# Patient Record
Sex: Female | Born: 1945 | Race: White | Hispanic: No | Smoking: Former smoker
Health system: Southern US, Community
[De-identification: ages and names within clinical notes are randomized; demographics above are authoritative.]

## PROBLEM LIST (undated history)

## (undated) DIAGNOSIS — F419 Anxiety disorder, unspecified: Secondary | ICD-10-CM

## (undated) DIAGNOSIS — I639 Cerebral infarction, unspecified: Secondary | ICD-10-CM

## (undated) DIAGNOSIS — E785 Hyperlipidemia, unspecified: Secondary | ICD-10-CM

## (undated) DIAGNOSIS — H7190 Unspecified cholesteatoma, unspecified ear: Secondary | ICD-10-CM

## (undated) DIAGNOSIS — J189 Pneumonia, unspecified organism: Secondary | ICD-10-CM

## (undated) DIAGNOSIS — Z9889 Other specified postprocedural states: Secondary | ICD-10-CM

## (undated) DIAGNOSIS — Z9049 Acquired absence of other specified parts of digestive tract: Secondary | ICD-10-CM

## (undated) DIAGNOSIS — E559 Vitamin D deficiency, unspecified: Secondary | ICD-10-CM

## (undated) DIAGNOSIS — C801 Malignant (primary) neoplasm, unspecified: Secondary | ICD-10-CM

## (undated) DIAGNOSIS — T7840XA Allergy, unspecified, initial encounter: Secondary | ICD-10-CM

## (undated) DIAGNOSIS — Z842 Family history of other diseases of the genitourinary system: Secondary | ICD-10-CM

## (undated) DIAGNOSIS — D0512 Intraductal carcinoma in situ of left breast: Secondary | ICD-10-CM

## (undated) DIAGNOSIS — IMO0002 Reserved for concepts with insufficient information to code with codable children: Secondary | ICD-10-CM

## (undated) DIAGNOSIS — J449 Chronic obstructive pulmonary disease, unspecified: Secondary | ICD-10-CM

## (undated) DIAGNOSIS — M8580 Other specified disorders of bone density and structure, unspecified site: Secondary | ICD-10-CM

## (undated) DIAGNOSIS — T4145XA Adverse effect of unspecified anesthetic, initial encounter: Secondary | ICD-10-CM

## (undated) DIAGNOSIS — I10 Essential (primary) hypertension: Secondary | ICD-10-CM

## (undated) DIAGNOSIS — N289 Disorder of kidney and ureter, unspecified: Secondary | ICD-10-CM

## (undated) DIAGNOSIS — T8859XA Other complications of anesthesia, initial encounter: Secondary | ICD-10-CM

## (undated) HISTORY — DX: Reserved for concepts with insufficient information to code with codable children: IMO0002

## (undated) HISTORY — DX: Pneumonia, unspecified organism: J18.9

## (undated) HISTORY — DX: Vitamin D deficiency, unspecified: E55.9

## (undated) HISTORY — DX: Acquired absence of other specified parts of digestive tract: Z90.49

## (undated) HISTORY — DX: Allergy, unspecified, initial encounter: T78.40XA

## (undated) HISTORY — DX: Unspecified cholesteatoma, unspecified ear: H71.90

## (undated) HISTORY — DX: Essential (primary) hypertension: I10

## (undated) HISTORY — DX: Family history of other diseases of the genitourinary system: Z84.2

## (undated) HISTORY — DX: Other specified disorders of bone density and structure, unspecified site: M85.80

## (undated) HISTORY — PX: ABDOMINAL HYSTERECTOMY: SHX81

## (undated) HISTORY — DX: Hyperlipidemia, unspecified: E78.5

## (undated) HISTORY — DX: Disorder of kidney and ureter, unspecified: N28.9

## (undated) HISTORY — PX: EYE SURGERY: SHX253

## (undated) HISTORY — PX: APPENDECTOMY: SHX54

## (undated) HISTORY — PX: OTHER SURGICAL HISTORY: SHX169

---

## 2000-07-16 ENCOUNTER — Encounter: Payer: Self-pay | Admitting: Family Medicine

## 2000-07-16 ENCOUNTER — Ambulatory Visit (HOSPITAL_COMMUNITY): Admission: RE | Admit: 2000-07-16 | Discharge: 2000-07-16 | Payer: Self-pay | Admitting: Family Medicine

## 2004-02-06 ENCOUNTER — Ambulatory Visit: Payer: Self-pay | Admitting: Family Medicine

## 2004-02-29 ENCOUNTER — Ambulatory Visit: Payer: Self-pay | Admitting: Family Medicine

## 2004-06-12 ENCOUNTER — Ambulatory Visit: Payer: Self-pay | Admitting: Family Medicine

## 2004-08-20 ENCOUNTER — Ambulatory Visit: Payer: Self-pay | Admitting: Family Medicine

## 2004-10-09 ENCOUNTER — Ambulatory Visit: Payer: Self-pay | Admitting: Family Medicine

## 2005-01-19 ENCOUNTER — Ambulatory Visit: Payer: Self-pay | Admitting: Family Medicine

## 2005-02-18 ENCOUNTER — Ambulatory Visit: Payer: Self-pay | Admitting: Family Medicine

## 2005-05-26 ENCOUNTER — Ambulatory Visit: Payer: Self-pay | Admitting: Family Medicine

## 2005-06-18 ENCOUNTER — Ambulatory Visit: Payer: Self-pay | Admitting: Family Medicine

## 2005-10-21 ENCOUNTER — Ambulatory Visit: Payer: Self-pay | Admitting: Family Medicine

## 2005-10-26 ENCOUNTER — Ambulatory Visit: Payer: Self-pay | Admitting: Family Medicine

## 2006-03-02 ENCOUNTER — Ambulatory Visit: Payer: Self-pay | Admitting: Family Medicine

## 2006-03-15 ENCOUNTER — Ambulatory Visit: Payer: Self-pay | Admitting: Family Medicine

## 2006-06-14 ENCOUNTER — Ambulatory Visit: Payer: Self-pay | Admitting: Vascular Surgery

## 2006-07-20 ENCOUNTER — Ambulatory Visit: Payer: Self-pay | Admitting: Family Medicine

## 2008-07-18 ENCOUNTER — Ambulatory Visit (HOSPITAL_COMMUNITY): Admission: RE | Admit: 2008-07-18 | Discharge: 2008-07-18 | Payer: Self-pay | Admitting: Endocrinology

## 2009-01-09 ENCOUNTER — Encounter: Admission: RE | Admit: 2009-01-09 | Discharge: 2009-01-09 | Payer: Self-pay | Admitting: Otolaryngology

## 2009-02-06 ENCOUNTER — Ambulatory Visit (HOSPITAL_COMMUNITY): Admission: RE | Admit: 2009-02-06 | Discharge: 2009-02-07 | Payer: Self-pay | Admitting: Otolaryngology

## 2009-02-06 ENCOUNTER — Encounter (INDEPENDENT_AMBULATORY_CARE_PROVIDER_SITE_OTHER): Payer: Self-pay | Admitting: Otolaryngology

## 2009-12-27 ENCOUNTER — Ambulatory Visit (HOSPITAL_COMMUNITY)
Admission: RE | Admit: 2009-12-27 | Discharge: 2009-12-27 | Payer: Self-pay | Source: Home / Self Care | Admitting: Otolaryngology

## 2010-05-29 LAB — HM DEXA SCAN

## 2010-06-12 LAB — CBC
MCH: 30.1 pg (ref 26.0–34.0)
MCHC: 33.5 g/dL (ref 30.0–36.0)
MCV: 89.9 fL (ref 78.0–100.0)
Platelets: 317 10*3/uL (ref 150–400)
RDW: 13 % (ref 11.5–15.5)
WBC: 12.7 10*3/uL — ABNORMAL HIGH (ref 4.0–10.5)

## 2010-06-12 LAB — BASIC METABOLIC PANEL
BUN: 22 mg/dL (ref 6–23)
CO2: 27 mEq/L (ref 19–32)
Chloride: 103 mEq/L (ref 96–112)
Creatinine, Ser: 1.9 mg/dL — ABNORMAL HIGH (ref 0.4–1.2)
Glucose, Bld: 101 mg/dL — ABNORMAL HIGH (ref 70–99)

## 2010-06-12 LAB — SURGICAL PCR SCREEN: MRSA, PCR: NEGATIVE

## 2010-07-02 LAB — CBC
Platelets: 441 10*3/uL — ABNORMAL HIGH (ref 150–400)
RDW: 12.7 % (ref 11.5–15.5)
WBC: 17.1 10*3/uL — ABNORMAL HIGH (ref 4.0–10.5)

## 2010-07-02 LAB — BASIC METABOLIC PANEL
BUN: 19 mg/dL (ref 6–23)
Calcium: 10.4 mg/dL (ref 8.4–10.5)
Creatinine, Ser: 1.89 mg/dL — ABNORMAL HIGH (ref 0.4–1.2)
GFR calc non Af Amer: 27 mL/min — ABNORMAL LOW (ref >60)
Glucose, Bld: 111 mg/dL — ABNORMAL HIGH (ref 70–99)

## 2010-07-08 ENCOUNTER — Encounter: Payer: Self-pay | Admitting: Nurse Practitioner

## 2010-07-08 DIAGNOSIS — M8580 Other specified disorders of bone density and structure, unspecified site: Secondary | ICD-10-CM | POA: Insufficient documentation

## 2010-07-08 DIAGNOSIS — K219 Gastro-esophageal reflux disease without esophagitis: Secondary | ICD-10-CM | POA: Insufficient documentation

## 2010-07-08 DIAGNOSIS — N184 Chronic kidney disease, stage 4 (severe): Secondary | ICD-10-CM | POA: Insufficient documentation

## 2010-07-08 DIAGNOSIS — M109 Gout, unspecified: Secondary | ICD-10-CM | POA: Insufficient documentation

## 2010-07-08 DIAGNOSIS — I10 Essential (primary) hypertension: Secondary | ICD-10-CM | POA: Insufficient documentation

## 2010-07-08 DIAGNOSIS — E785 Hyperlipidemia, unspecified: Secondary | ICD-10-CM | POA: Insufficient documentation

## 2010-07-08 DIAGNOSIS — M889 Osteitis deformans of unspecified bone: Secondary | ICD-10-CM | POA: Insufficient documentation

## 2010-07-08 DIAGNOSIS — Z842 Family history of other diseases of the genitourinary system: Secondary | ICD-10-CM | POA: Insufficient documentation

## 2010-07-08 DIAGNOSIS — N289 Disorder of kidney and ureter, unspecified: Secondary | ICD-10-CM

## 2010-07-08 DIAGNOSIS — H7190 Unspecified cholesteatoma, unspecified ear: Secondary | ICD-10-CM | POA: Insufficient documentation

## 2010-07-08 DIAGNOSIS — IMO0002 Reserved for concepts with insufficient information to code with codable children: Secondary | ICD-10-CM

## 2010-07-08 DIAGNOSIS — Z9049 Acquired absence of other specified parts of digestive tract: Secondary | ICD-10-CM | POA: Insufficient documentation

## 2012-01-04 ENCOUNTER — Other Ambulatory Visit: Payer: Self-pay | Admitting: Gastroenterology

## 2012-06-22 ENCOUNTER — Encounter (HOSPITAL_COMMUNITY): Payer: Self-pay

## 2012-06-27 ENCOUNTER — Telehealth: Payer: Self-pay | Admitting: Family Medicine

## 2012-06-27 NOTE — Telephone Encounter (Signed)
Per pt can/t tolerarted zetia causes her to have leg cramps Wants to stop it.

## 2012-06-27 NOTE — Telephone Encounter (Signed)
Stop zetia.  

## 2012-06-27 NOTE — Telephone Encounter (Signed)
Pt aware to stop zetia

## 2012-07-11 ENCOUNTER — Telehealth: Payer: Self-pay | Admitting: Family Medicine

## 2012-07-11 DIAGNOSIS — E785 Hyperlipidemia, unspecified: Secondary | ICD-10-CM

## 2012-07-11 NOTE — Telephone Encounter (Signed)
Stop meds. OV ASAP

## 2012-07-12 MED ORDER — FENOFIBRATE 54 MG PO TABS: 54.0000 mg | ORAL_TABLET | Freq: Every day | ORAL | Status: DC

## 2012-07-12 NOTE — Telephone Encounter (Signed)
Pt came to clinic and expressed problems with the pravastatin. Per dr Jacelyn Grip she is to stop it and new med will  Be sent to [facility] pt aware and advised if experience any difficulty with med to call office. Pt verbalized understanding

## 2012-07-13 ENCOUNTER — Telehealth: Payer: Self-pay | Admitting: *Deleted

## 2012-07-13 NOTE — Telephone Encounter (Signed)
Pharmacy called stating that pt already taking fenofibrate 137mg . Discussed with Dr Jacelyn Grip and he increased to fenofibrate 160 qd. Pharmacy notified

## 2012-07-18 ENCOUNTER — Telehealth: Payer: Self-pay | Admitting: Family Medicine

## 2012-07-18 NOTE — Telephone Encounter (Signed)
Pt notified and per dr Jacelyn Grip stay on fenofibrate for now. appt end of may.

## 2012-08-18 ENCOUNTER — Other Ambulatory Visit (INDEPENDENT_AMBULATORY_CARE_PROVIDER_SITE_OTHER): Payer: Medicare Other

## 2012-08-18 DIAGNOSIS — R5381 Other malaise: Secondary | ICD-10-CM

## 2012-08-18 DIAGNOSIS — E559 Vitamin D deficiency, unspecified: Secondary | ICD-10-CM

## 2012-08-18 DIAGNOSIS — R5383 Other fatigue: Secondary | ICD-10-CM

## 2012-08-18 DIAGNOSIS — E785 Hyperlipidemia, unspecified: Secondary | ICD-10-CM

## 2012-08-18 LAB — POCT CBC
Granulocyte percent: 73.3 %G (ref 37–80)
HCT, POC: 29.8 % — AB (ref 37.7–47.9)
Hemoglobin: 10.2 g/dL — AB (ref 12.2–16.2)
Lymph, poc: 1.4 (ref 0.6–3.4)
MCH, POC: 31.5 pg — AB (ref 27–31.2)
MCHC: 34.3 g/dL (ref 31.8–35.4)
MCV: 91.9 fL (ref 80–97)
MPV: 8.6 fL (ref 0–99.8)
POC Granulocyte: 5.1 (ref 2–6.9)
POC LYMPH PERCENT: 19.5 %L (ref 10–50)
Platelet Count, POC: 331 10*3/uL (ref 142–424)
RBC: 3.3 M/uL — AB (ref 4.04–5.48)
RDW, POC: 13.5 %
WBC: 7 10*3/uL (ref 4.6–10.2)

## 2012-08-18 LAB — LIPID PANEL
Cholesterol: 159 mg/dL (ref 0–200)
HDL: 23 mg/dL — ABNORMAL LOW (ref >39)
LDL Cholesterol: 94 mg/dL (ref 0–99)
Total CHOL/HDL Ratio: 6.9 Ratio
Triglycerides: 209 mg/dL — ABNORMAL HIGH (ref <150)
VLDL: 42 mg/dL — ABNORMAL HIGH (ref 0–40)

## 2012-08-18 LAB — COMPREHENSIVE METABOLIC PANEL
ALT: 23 U/L (ref 0–35)
AST: 32 U/L (ref 0–37)
Albumin: 4.3 g/dL (ref 3.5–5.2)
Alkaline Phosphatase: 135 U/L — ABNORMAL HIGH (ref 39–117)
BUN: 26 mg/dL — ABNORMAL HIGH (ref 6–23)
CO2: 23 mEq/L (ref 19–32)
Calcium: 10.1 mg/dL (ref 8.4–10.5)
Chloride: 108 mEq/L (ref 96–112)
Creat: 2.1 mg/dL — ABNORMAL HIGH (ref 0.50–1.10)
Glucose, Bld: 96 mg/dL (ref 70–99)
Potassium: 5.4 mEq/L — ABNORMAL HIGH (ref 3.5–5.3)
Sodium: 141 mEq/L (ref 135–145)
Total Bilirubin: 0.5 mg/dL (ref 0.3–1.2)
Total Protein: 7.2 g/dL (ref 6.0–8.3)

## 2012-08-19 LAB — VITAMIN D 25 HYDROXY (VIT D DEFICIENCY, FRACTURES): Vit D, 25-Hydroxy: 20 ng/mL — ABNORMAL LOW (ref 30–89)

## 2012-08-20 NOTE — Progress Notes (Signed)
Quick Note:  Labs abnormal. Anemic Kidney functions abnormal Lipids not at goal Vitamin D is low Potassium is borderline high.  Needs office visit.  ______

## 2012-08-25 ENCOUNTER — Encounter: Payer: Self-pay | Admitting: Family Medicine

## 2012-08-25 ENCOUNTER — Ambulatory Visit (INDEPENDENT_AMBULATORY_CARE_PROVIDER_SITE_OTHER): Payer: Medicare Other | Admitting: Family Medicine

## 2012-08-25 ENCOUNTER — Other Ambulatory Visit: Payer: Self-pay | Admitting: Family Medicine

## 2012-08-25 VITALS — BP 135/69 | HR 90 | Temp 97.0°F | Wt 109.6 lb

## 2012-08-25 DIAGNOSIS — Z23 Encounter for immunization: Secondary | ICD-10-CM | POA: Insufficient documentation

## 2012-08-25 DIAGNOSIS — M949 Disorder of cartilage, unspecified: Secondary | ICD-10-CM

## 2012-08-25 DIAGNOSIS — E785 Hyperlipidemia, unspecified: Secondary | ICD-10-CM

## 2012-08-25 DIAGNOSIS — M889 Osteitis deformans of unspecified bone: Secondary | ICD-10-CM

## 2012-08-25 DIAGNOSIS — I10 Essential (primary) hypertension: Secondary | ICD-10-CM

## 2012-08-25 DIAGNOSIS — Z842 Family history of other diseases of the genitourinary system: Secondary | ICD-10-CM

## 2012-08-25 DIAGNOSIS — M899 Disorder of bone, unspecified: Secondary | ICD-10-CM

## 2012-08-25 DIAGNOSIS — N289 Disorder of kidney and ureter, unspecified: Secondary | ICD-10-CM

## 2012-08-25 DIAGNOSIS — K219 Gastro-esophageal reflux disease without esophagitis: Secondary | ICD-10-CM

## 2012-08-25 DIAGNOSIS — D649 Anemia, unspecified: Secondary | ICD-10-CM | POA: Insufficient documentation

## 2012-08-25 DIAGNOSIS — D638 Anemia in other chronic diseases classified elsewhere: Secondary | ICD-10-CM

## 2012-08-25 DIAGNOSIS — M8580 Other specified disorders of bone density and structure, unspecified site: Secondary | ICD-10-CM

## 2012-08-25 DIAGNOSIS — E875 Hyperkalemia: Secondary | ICD-10-CM | POA: Insufficient documentation

## 2012-08-25 DIAGNOSIS — IMO0001 Reserved for inherently not codable concepts without codable children: Secondary | ICD-10-CM | POA: Insufficient documentation

## 2012-08-25 DIAGNOSIS — R350 Frequency of micturition: Secondary | ICD-10-CM

## 2012-08-25 DIAGNOSIS — M109 Gout, unspecified: Secondary | ICD-10-CM

## 2012-08-25 LAB — POCT UA - MICROSCOPIC ONLY
Casts, Ur, LPF, POC: NEGATIVE
Crystals, Ur, HPF, POC: NEGATIVE
Yeast, UA: NEGATIVE

## 2012-08-25 LAB — POCT URINALYSIS DIPSTICK
Bilirubin, UA: NEGATIVE
Glucose, UA: NEGATIVE
Ketones, UA: NEGATIVE
Leukocytes, UA: NEGATIVE
Nitrite, UA: NEGATIVE
Spec Grav, UA: 1.02
Urobilinogen, UA: NEGATIVE
pH, UA: 5

## 2012-08-25 LAB — BASIC METABOLIC PANEL WITH GFR
BUN: 26 mg/dL — ABNORMAL HIGH (ref 6–23)
CO2: 21 mEq/L (ref 19–32)
Calcium: 9.9 mg/dL (ref 8.4–10.5)
Chloride: 106 mEq/L (ref 96–112)
Creat: 1.93 mg/dL — ABNORMAL HIGH (ref 0.50–1.10)
GFR, Est African American: 31 mL/min — ABNORMAL LOW
GFR, Est Non African American: 27 mL/min — ABNORMAL LOW
Glucose, Bld: 97 mg/dL (ref 70–99)
Potassium: 4.7 mEq/L (ref 3.5–5.3)
Sodium: 140 mEq/L (ref 135–145)

## 2012-08-25 LAB — URIC ACID: Uric Acid, Serum: 4.2 mg/dL (ref 2.4–6.0)

## 2012-08-25 MED ORDER — ROSUVASTATIN CALCIUM 10 MG PO TABS: 10.0000 mg | ORAL_TABLET | Freq: Every day | ORAL | Status: DC

## 2012-08-25 NOTE — Progress Notes (Signed)
Quick Note:  Labs abnormal. Urine has a few bacteria. Will need a Urine Culture. Can she come back in to give a midstream clean catch urine. Ordered in EPIC. Rest of labs Stable chronic kidney disease and the uric acid is good. ______

## 2012-08-25 NOTE — Progress Notes (Signed)
Pt tolerated injection well without difficulty

## 2012-08-25 NOTE — Progress Notes (Signed)
Patient ID: Diane Carlson, female   DOB: December 09, 1945, 67 y.o.   MRN: [id number] SUBJECTIVE: HPI: Patient is here for follow up of hyperlipidemia:  denies Headache;denies Chest Pain;denies weakness;denies Shortness of Breath and orthopnea;denies Visual changes;denies palpitations;denies cough;denies pedal edema;denies symptoms of TIA or stroke;deniesClaudication symptoms. admits to Compliance with medications; Had problems with the pravastatin, did  Good in the past with the crestor. Would like to go back on it.  CKD: doing well would like her urine checked to make sure no infection even though no symptoms  PMH/PSH: reviewed/updated in Epic  SH/FH: reviewed/updated in Epic  Allergies: reviewed/updated in Epic  Medications: reviewed/updated in Epic  Immunizations: reviewed/updated in Epic  ROS: As above in the HPI. All other systems are stable or negative.  OBJECTIVE: APPEARANCE:  Patient in no acute distress.The patient appeared well nourished and normally developed. Acyanotic. Waist: VITAL SIGNS:BP 135/69  Pulse 90  Temp(Src) 97 F (36.1 C) (Oral)  Wt 109 lb 9.6 oz (49.714 kg) Petite WF  SKIN: warm and  Dry without overt rashes, tattoos and scars. Right dorsal hand skin tear.  HEAD and Neck: without JVD, Head and scalp: normal Eyes:No scleral icterus. Fundi normal, eye movements normal. Ears: Auricle normal, canal normal, Tympanic membranes normal, insufflation normal. Nose: normal Throat: normal Neck & thyroid: normal  CHEST & LUNGS: Chest wall: normal Lungs: Clear  CVS: Reveals the PMI to be normally located. Regular rhythm, First and Second Heart sounds are normal,  absence of murmurs, rubs or gallops. Peripheral vasculature: Radial pulses: normal Dorsal pedis pulses: normal Posterior pulses: normal  ABDOMEN:  Appearance: normal Benign,, no organomegaly, no masses, no Abdominal Aortic enlargement. No Guarding , no rebound. No Bruits. Bowel sounds:  normal  RECTAL: N/A GU: N/A  EXTREMETIES: nonedematous. Both Femoral and Pedal pulses are normal.  MUSCULOSKELETAL:  Spine: normal Joints: intact  NEUROLOGIC: oriented to time,place and person; nonfocal. Strength is normal Sensory is normal Reflexes are normal Cranial Nerves are normal.  ASSESSMENT: Frequency - Plan: POCT urinalysis dipstick, POCT UA - Microscopic Only  Osteitis deformans  Osteopenia  Kidney disease - Plan: BASIC METABOLIC PANEL WITH GFR  Hyperlipidemia - Plan: rosuvastatin (CRESTOR) 10 MG tablet  Gout - Plan: Uric acid  GERD (gastroesophageal reflux disease)  Essential hypertension, benign  FH: TAH-BSO (total abdominal hysterectomy and bilateral salpingo-oophorectomy)  Anemia of chronic disease  Hyperkalemia - Plan: BASIC METABOLIC PANEL WITH GFR  PLAN: Orders Placed This Encounter  Procedures  . Pneumococcal polysaccharide vaccine 23-valent greater than or equal to 2yo subcutaneous/IM  . BASIC METABOLIC PANEL WITH GFR  . Uric acid  . POCT urinalysis dipstick  . POCT UA - Microscopic Only   Meds ordered this encounter  Medications  . DISCONTD: Choline Fenofibrate (FENOFIBRIC ACID) 135 MG CPDR    Sig:   . DISCONTD: pravastatin (PRAVACHOL) 40 MG tablet    Sig:   . fish oil-omega-3 fatty acids 1000 MG capsule    Sig: Take 2 g by mouth daily.  . fenofibrate 160 MG tablet    Sig: Take 160 mg by mouth daily.  . rosuvastatin (CRESTOR) 10 MG tablet    Sig: Take 1 tablet (10 mg total) by mouth daily.    Dispense:  30 tablet    Refill:  3   Reviewed labs with patient. Await the urine  Result.  RTC 3 months.  Demontay Grantham P. Jacelyn Grip, M.D.

## 2012-08-25 NOTE — Patient Instructions (Addendum)

## 2012-08-30 ENCOUNTER — Ambulatory Visit (INDEPENDENT_AMBULATORY_CARE_PROVIDER_SITE_OTHER): Payer: Medicare Other | Admitting: *Deleted

## 2012-08-30 DIAGNOSIS — N390 Urinary tract infection, site not specified: Secondary | ICD-10-CM

## 2012-09-01 LAB — URINE CULTURE
Colony Count: NO GROWTH
Organism ID, Bacteria: NO GROWTH

## 2012-09-01 NOTE — Progress Notes (Signed)
Quick Note:  Call patient. Labs normal.Urine culture negative. No infection. No change in plan. ______

## 2012-09-07 ENCOUNTER — Other Ambulatory Visit: Payer: Self-pay | Admitting: Family Medicine

## 2012-10-18 ENCOUNTER — Ambulatory Visit (INDEPENDENT_AMBULATORY_CARE_PROVIDER_SITE_OTHER): Payer: Medicare Other | Admitting: General Practice

## 2012-10-18 VITALS — BP 130/65 | HR 82 | Temp 97.2°F | Ht 63.0 in | Wt 108.0 lb

## 2012-10-18 DIAGNOSIS — J322 Chronic ethmoidal sinusitis: Secondary | ICD-10-CM

## 2012-10-18 MED ORDER — AMOXICILLIN 500 MG PO CAPS: 500.0000 mg | ORAL_CAPSULE | Freq: Two times a day (BID) | ORAL | Status: DC

## 2012-10-18 MED ORDER — CLARITHROMYCIN 500 MG PO TABS: 500.0000 mg | ORAL_TABLET | Freq: Two times a day (BID) | ORAL | Status: DC

## 2012-10-18 NOTE — Patient Instructions (Signed)

## 2012-10-18 NOTE — Progress Notes (Signed)
  Subjective:    Patient ID: Diane Carlson, female    DOB: Jul 16, 1945, 67 y.o.   MRN: [id number]  Sinusitis This is a new problem. The current episode started in the past 7 days. The problem has been rapidly worsening since onset. There has been no fever. Her pain is at a severity of 0/10. Associated symptoms include chills, congestion, coughing, headaches and sinus pressure. Pertinent negatives include no ear pain, neck pain, shortness of breath or sore throat. Past treatments include acetaminophen.   Patient presents today with complaints of sinus pressure and cough.     Review of Systems  Constitutional: Positive for chills.  HENT: Positive for congestion, rhinorrhea, postnasal drip and sinus pressure. Negative for ear pain, sore throat, trouble swallowing and neck pain.   Eyes: Negative for redness, itching and visual disturbance.  Respiratory: Positive for cough. Negative for shortness of breath.   Cardiovascular: Negative for chest pain and palpitations.  Neurological: Positive for headaches. Negative for dizziness and weakness.       Objective:   Physical Exam  Constitutional: She appears well-developed and well-nourished.  HENT:  Nose: Right sinus exhibits maxillary sinus tenderness and frontal sinus tenderness. Left sinus exhibits maxillary sinus tenderness and frontal sinus tenderness.  Mouth/Throat: Posterior oropharyngeal edema present.  Cardiovascular: Normal rate, regular rhythm and normal heart sounds.   Pulmonary/Chest: Effort normal and breath sounds normal.          Assessment & Plan:  1. Ethmoid sinusitis -amoxicillin 500mg  PO BID for 10 days -increase fluids -new toothbrush  -RTO if symptoms worsen or seek emergent medical attention -Patient verbalized understanding -Erby Pian, FNP-C

## 2012-10-28 ENCOUNTER — Other Ambulatory Visit: Payer: Self-pay | Admitting: General Practice

## 2012-10-28 NOTE — Telephone Encounter (Signed)
Last seen 08/25/12  [facility]  Last filled 10/18/12

## 2012-10-31 ENCOUNTER — Other Ambulatory Visit: Payer: Self-pay | Admitting: *Deleted

## 2012-10-31 DIAGNOSIS — J322 Chronic ethmoidal sinusitis: Secondary | ICD-10-CM

## 2012-10-31 NOTE — Telephone Encounter (Signed)
Patient seen in office on 10-18-12. This med was rxd. Now requesting refill . Please advise

## 2012-11-01 NOTE — Telephone Encounter (Signed)
If having same symptoms needs a different antibiotic and needs to be seen.

## 2012-11-01 NOTE — Telephone Encounter (Signed)
Left detailed message that patient would need to be seen if she is not feeling any better

## 2012-11-04 ENCOUNTER — Other Ambulatory Visit: Payer: Self-pay | Admitting: Family Medicine

## 2012-11-07 NOTE — Telephone Encounter (Signed)
LAST LIPID 5/14

## 2012-11-29 ENCOUNTER — Ambulatory Visit (INDEPENDENT_AMBULATORY_CARE_PROVIDER_SITE_OTHER): Payer: Medicare Other | Admitting: Family Medicine

## 2012-11-29 ENCOUNTER — Encounter: Payer: Self-pay | Admitting: Family Medicine

## 2012-11-29 VITALS — BP 134/72 | HR 86 | Temp 97.2°F | Wt 108.0 lb

## 2012-11-29 DIAGNOSIS — T7840XA Allergy, unspecified, initial encounter: Secondary | ICD-10-CM | POA: Insufficient documentation

## 2012-11-29 DIAGNOSIS — E785 Hyperlipidemia, unspecified: Secondary | ICD-10-CM

## 2012-11-29 DIAGNOSIS — N289 Disorder of kidney and ureter, unspecified: Secondary | ICD-10-CM

## 2012-11-29 DIAGNOSIS — K219 Gastro-esophageal reflux disease without esophagitis: Secondary | ICD-10-CM

## 2012-11-29 DIAGNOSIS — M109 Gout, unspecified: Secondary | ICD-10-CM

## 2012-11-29 DIAGNOSIS — J302 Other seasonal allergic rhinitis: Secondary | ICD-10-CM

## 2012-11-29 DIAGNOSIS — I10 Essential (primary) hypertension: Secondary | ICD-10-CM

## 2012-11-29 DIAGNOSIS — M899 Disorder of bone, unspecified: Secondary | ICD-10-CM

## 2012-11-29 DIAGNOSIS — M8580 Other specified disorders of bone density and structure, unspecified site: Secondary | ICD-10-CM

## 2012-11-29 DIAGNOSIS — M889 Osteitis deformans of unspecified bone: Secondary | ICD-10-CM

## 2012-11-29 DIAGNOSIS — D638 Anemia in other chronic diseases classified elsewhere: Secondary | ICD-10-CM

## 2012-11-29 DIAGNOSIS — J309 Allergic rhinitis, unspecified: Secondary | ICD-10-CM

## 2012-11-29 LAB — POCT CBC
Granulocyte percent: 80.9 %G — AB (ref 37–80)
HCT, POC: 29.1 % — AB (ref 37.7–47.9)
Hemoglobin: 9.5 g/dL — AB (ref 12.2–16.2)
Lymph, poc: 1.4 (ref 0.6–3.4)
MCH, POC: 30 pg (ref 27–31.2)
MCHC: 32.8 g/dL (ref 31.8–35.4)
MCV: 91.5 fL (ref 80–97)
MPV: 8 fL (ref 0–99.8)
POC Granulocyte: 9 — AB (ref 2–6.9)
POC LYMPH PERCENT: 12.4 %L (ref 10–50)
Platelet Count, POC: 364 10*3/uL (ref 142–424)
RBC: 3.2 M/uL — AB (ref 4.04–5.48)
RDW, POC: 13.6 %
WBC: 11.1 10*3/uL — AB (ref 4.6–10.2)

## 2012-11-29 MED ORDER — DESLORATADINE 5 MG PO TBDP: 5.0000 mg | ORAL_TABLET | Freq: Every day | ORAL | Status: DC

## 2012-11-29 MED ORDER — FLUTICASONE PROPIONATE 50 MCG/ACT NA SUSP: 2.0000 | Freq: Every day | NASAL | Status: DC

## 2012-11-29 NOTE — Patient Instructions (Signed)
      Dr Ivannah Zody's Recommendations  For nutrition information, I recommend books:  1).Eat to Live by Dr Joel Fuhrman. 2).Prevent and Reverse Heart Disease by Dr Caldwell Esselstyn. 3) Dr Neal Barnard's Book:  Program to Reverse Diabetes  Exercise recommendations are:  If unable to walk, then the patient can exercise in a chair 3 times a day. By flapping arms like a bird gently and raising legs outwards to the front.  If ambulatory, the patient can go for walks for 30 minutes 3 times a week. Then increase the intensity and duration as tolerated.  Goal is to try to attain exercise frequency to 5 times a week.  If applicable: Best to perform resistance exercises (machines or weights) 2 days a week and cardio type exercises 3 days per week.  

## 2012-11-29 NOTE — Progress Notes (Signed)
Patient ID: [name], female   DOB: [birth year], 67 y.o.   MRN: [id number] SUBJECTIVE: CC: Chief Complaint  Patient presents with  . Follow-up    3 month follow up c/o sinus and itching on rt uper hip     HPI: Has had sinus congestion and runny nose. Sneezing, and cough. When he lies down he stops up a lot. Had some fever Sunday night(2nights ago).  Patient is here for follow up of hyperlipidemia/HTN/CKD/Chronic anemia/ gout and osteopenia.: denies Headache;denies Chest Pain;denies weakness;denies Shortness of Breath and orthopnea;denies Visual changes;denies palpitations; Has a cough from her allergies.  denies pedal edema;denies symptoms of TIA or stroke;deniesClaudication symptoms. admits to Compliance with medications; denies Problems with medications.  Also has an area of itching on the left hip and right ankle.  Past Medical History  Diagnosis Date  . Hx of appendectomy   . FH: TAH-BSO (total abdominal hysterectomy and bilateral salpingo-oophorectomy)   . Paget's disease   . Osteopenia   . Essential hypertension, benign   . Hyperlipidemia   . GERD (gastroesophageal reflux disease)   . Kidney disease   . Gout   . Cholesteatoma    Past Surgical History  Procedure Laterality Date  . Appendectomy    . Carotid endoectomy Left   . Abdominal hysterectomy     History   Social History  . Marital Status: Married    Spouse Name: N/A    Number of Children: N/A  . Years of Education: N/A   Occupational History  . Not on file.   Social History Main Topics  . Smoking status: Former Smoker -- 1.00 packs/day    Types: Cigarettes    Quit date: 08/25/2009  . Smokeless tobacco: Not on file  . Alcohol Use: Not on file  . Drug Use: Not on file  . Sexual Activity: Not on file   Other Topics Concern  . Not on file   Social History Narrative  . No narrative on file   Family History  Problem Relation Age of Onset  . Heart disease Mother   . Heart disease Father     Current Outpatient Prescriptions on File Prior to Visit  Medication Sig Dispense Refill  . amLODipine (NORVASC) 10 MG tablet TAKE ONE TABLET BY MOUTH ONE TIME DAILY  30 tablet  5  . aspirin 81 MG tablet Take 81 mg by mouth daily.        . fenofibrate 160 MG tablet TAKE ONE TABLET BY MOUTH ONE TIME DAILY  30 tablet  3  . fish oil-omega-3 fatty acids 1000 MG capsule Take 2 g by mouth daily.      . rosuvastatin (CRESTOR) 10 MG tablet Take 1 tablet (10 mg total) by mouth daily.  30 tablet  3  . ULORIC 40 MG tablet TAKE ONE TABLET BY MOUTH ONE TIME DAILY  30 tablet  5   No current facility-administered medications on file prior to visit.   Allergies  Allergen Reactions  . Cephalexin   . Pravastatin     Muscle aches  . Sulfa Antibiotics   . Zetia [Ezetimibe]     mylagias   Immunization History  Administered Date(s) Administered  . Influenza Whole 12/28/2009  . Pneumococcal Polysaccharide 08/25/2012  . Tdap 09/04/2010   Prior to Admission medications   Medication Sig Start Date End Date Taking? Authorizing Provider  amLODipine (NORVASC) 10 MG tablet TAKE ONE TABLET BY MOUTH ONE TIME DAILY 09/07/12  Yes Vernie Shanks, MD  aspirin 81 MG tablet Take 81 mg by mouth daily.     Yes Historical Provider, MD  fenofibrate 160 MG tablet TAKE ONE TABLET BY MOUTH ONE TIME DAILY 11/04/12  Yes Vernie Shanks, MD  fish oil-omega-3 fatty acids 1000 MG capsule Take 2 g by mouth daily.   Yes Historical Provider, MD  rosuvastatin (CRESTOR) 10 MG tablet Take 1 tablet (10 mg total) by mouth daily. 08/25/12  Yes Vernie Shanks, MD  ULORIC 40 MG tablet TAKE ONE TABLET BY MOUTH ONE TIME DAILY 08/25/12  Yes Vernie Shanks, MD     ROS: As above in the HPI. All other systems are stable or negative.  OBJECTIVE: APPEARANCE:  Patient in no acute distress.The patient appeared well nourished and normally developed. Acyanotic. Waist: VITAL SIGNS:BP 134/72  Pulse 86  Temp(Src) 97.2 F (36.2 C) (Oral)  Wt 108  lb (48.988 kg)  BMI 19.14 kg/m2 WF Nasally congested. SKIN: warm and  Dry without overt rashes, tattoos and scars  HEAD and Neck: without JVD, Head and scalp: normal Eyes:No scleral icterus. Fundi normal, eye movements normal. Ears: Auricle normal, canal normal, Tympanic membranes normal, insufflation normal. Nose: boggy swollen nasal mucosa. Sinuses non tender. Post nasal drip and occasional hacky cough.  Throat: normal Neck & thyroid: normal  CHEST & LUNGS: Chest wall: normal Lungs: Clear  CVS: Reveals the PMI to be normally located. Regular rhythm, First and Second Heart sounds are normal,  absence of murmurs, rubs or gallops. Peripheral vasculature: Radial pulses: normal Dorsal pedis pulses: normal Posterior pulses: normal  ABDOMEN:  Appearance: normal Benign, no organomegaly, no masses, no Abdominal Aortic enlargement. No Guarding , no rebound. No Bruits. Bowel sounds: normal  RECTAL: N/A GU: N/A  EXTREMETIES: nonedematous.  MUSCULOSKELETAL:  Spine: normal Joints: intact  NEUROLOGIC: oriented to time,place and person; nonfocal. Strength is normal Sensory is normal Reflexes are normal Cranial Nerves are normal.  Results for orders placed in visit on 08/30/12  URINE CULTURE      Result Value Range   Colony Count NO GROWTH     Organism ID, Bacteria NO GROWTH      ASSESSMENT: Anemia of chronic disease - Plan: POCT CBC  Essential hypertension, benign - Plan: CMP14+EGFR  GERD (gastroesophageal reflux disease)  Gout - Plan: Uric acid  Hyperlipidemia - Plan: CMP14+EGFR, NMR, lipoprofile  Kidney disease - Plan: CMP14+EGFR  Osteitis deformans  Osteopenia - Plan: Vit D  25 hydroxy (rtn osteoporosis monitoring)  Seasonal allergic rhinitis - Plan: desloratadine (CLARINEX REDITAB) 5 MG disintegrating tablet, fluticasone (FLONASE) 50 MCG/ACT nasal spray  PLAN:       Dr Paula Libra Recommendations  For nutrition information, I recommend  books:  1).Eat to Live by Dr Excell Seltzer. 2).Prevent and Reverse Heart Disease by Dr Karl Luke. 3) Dr Janene Harvey Book:  Program to Reverse Diabetes  Exercise recommendations are:  If unable to walk, then the patient can exercise in a chair 3 times a day. By flapping arms like a bird gently and raising legs outwards to the front.  If ambulatory, the patient can go for walks for 30 minutes 3 times a week. Then increase the intensity and duration as tolerated.  Goal is to try to attain exercise frequency to 5 times a week.  If applicable: Best to perform resistance exercises (machines or weights) 2 days a week and cardio type exercises 3 days per week.  Orders Placed This Encounter  Procedures  . CMP14+EGFR  . NMR, lipoprofile  .  Vit D  25 hydroxy (rtn osteoporosis monitoring)  . Uric acid  . POCT CBC    Meds ordered this encounter  Medications  . desloratadine (CLARINEX REDITAB) 5 MG disintegrating tablet    Sig: Take 1 tablet (5 mg total) by mouth daily.    Dispense:  30 tablet    Refill:  3  . fluticasone (FLONASE) 50 MCG/ACT nasal spray    Sig: Place 2 sprays into the nose daily.    Dispense:  16 g    Refill:  6    Return in about 3 months (around 02/28/2013) for Recheck medical problems.  Kien Mirsky P. Jacelyn Grip, M.D.

## 2012-11-30 LAB — CMP14+EGFR
ALT: 26 IU/L (ref 0–32)
AST: 39 IU/L (ref 0–40)
Albumin/Globulin Ratio: 1.7 (ref 1.1–2.5)
Albumin: 4.5 g/dL (ref 3.6–4.8)
Alkaline Phosphatase: 163 IU/L — ABNORMAL HIGH (ref 39–117)
BUN/Creatinine Ratio: 11 (ref 11–26)
BUN: 23 mg/dL (ref 8–27)
CO2: 20 mmol/L (ref 18–29)
Calcium: 9.5 mg/dL (ref 8.6–10.2)
Chloride: 104 mmol/L (ref 97–108)
Creatinine, Ser: 2.18 mg/dL — ABNORMAL HIGH (ref 0.57–1.00)
GFR calc Af Amer: 27 mL/min/{1.73_m2} — ABNORMAL LOW (ref >59)
GFR calc non Af Amer: 23 mL/min/{1.73_m2} — ABNORMAL LOW (ref >59)
Globulin, Total: 2.7 g/dL (ref 1.5–4.5)
Glucose: 100 mg/dL — ABNORMAL HIGH (ref 65–99)
Potassium: 6 mmol/L — ABNORMAL HIGH (ref 3.5–5.2)
Sodium: 143 mmol/L (ref 134–144)
Total Bilirubin: 0.2 mg/dL (ref 0.0–1.2)
Total Protein: 7.2 g/dL (ref 6.0–8.5)

## 2012-11-30 LAB — NMR, LIPOPROFILE
Cholesterol: 99 mg/dL (ref <200)
HDL Cholesterol by NMR: 32 mg/dL — ABNORMAL LOW (ref >=40)
HDL Particle Number: 32 umol/L (ref >=30.5)
LDL Particle Number: 1424 nmol/L — ABNORMAL HIGH (ref <1000)
LDL Size: 20.6 nm (ref >20.5)
LDLC SERPL CALC-MCNC: 38 mg/dL (ref <100)
LP-IR Score: 64 — ABNORMAL HIGH (ref <=45)
Small LDL Particle Number: 1046 nmol/L — ABNORMAL HIGH (ref <=527)
Triglycerides by NMR: 145 mg/dL (ref <150)

## 2012-11-30 LAB — URIC ACID: Uric Acid: 7 mg/dL (ref 2.5–7.1)

## 2012-11-30 LAB — VITAMIN D 25 HYDROXY (VIT D DEFICIENCY, FRACTURES): Vit D, 25-Hydroxy: 14.1 ng/mL — ABNORMAL LOW (ref 30.0–100.0)

## 2012-12-06 ENCOUNTER — Telehealth: Payer: Self-pay | Admitting: Family Medicine

## 2012-12-07 ENCOUNTER — Other Ambulatory Visit: Payer: Self-pay | Admitting: Family Medicine

## 2012-12-07 DIAGNOSIS — N189 Chronic kidney disease, unspecified: Secondary | ICD-10-CM

## 2012-12-07 NOTE — Progress Notes (Signed)
Quick Note:  Labs abnormal. Anemia still present, slightly worse The kidney function slightly worse. The lipids are abnormal.and the uric acid is not at goal She needs to see a nephrologist. i will refer in EPIC. I would like her to see Tammy or Sharyn Lull to adjust meds. ______

## 2012-12-08 NOTE — Telephone Encounter (Signed)
Spoke with pt copy  labs mailed to pt .states has appt with [facility] next week.

## 2012-12-12 ENCOUNTER — Telehealth: Payer: Self-pay | Admitting: Family Medicine

## 2012-12-13 NOTE — Telephone Encounter (Signed)
Left message for patient to call and schedule an appointment with pharmacist at [facility]

## 2012-12-14 ENCOUNTER — Other Ambulatory Visit: Payer: Self-pay | Admitting: Family Medicine

## 2013-01-10 ENCOUNTER — Encounter (INDEPENDENT_AMBULATORY_CARE_PROVIDER_SITE_OTHER): Payer: Self-pay

## 2013-01-10 ENCOUNTER — Ambulatory Visit (INDEPENDENT_AMBULATORY_CARE_PROVIDER_SITE_OTHER): Payer: Medicare Other | Admitting: Pharmacist Clinician (PhC)/ Clinical Pharmacy Specialist

## 2013-01-10 DIAGNOSIS — E785 Hyperlipidemia, unspecified: Secondary | ICD-10-CM

## 2013-01-10 NOTE — Progress Notes (Signed)
  Subjective:    Patient ID: Diane Carlson, female    DOB: Jun 06, 1945, 67 y.o.   MRN: [id number]  HPI:  Long standing dyslipidemia with pharmacologic treatment.  History of CKD    Review of Systems  Constitutional: Negative.   HENT: Negative.   Endocrine: Negative.   Neurological: Negative.   Psychiatric/Behavioral: Negative.        Objective:   Physical Exam  Constitutional: She is oriented to person, place, and time. She appears well-developed and well-nourished.  Neurological: She is alert and oriented to person, place, and time.  Skin: Skin is warm and dry.  Psychiatric: She has a normal mood and affect. Her behavior is normal. Judgment and thought content normal.          Assessment & Plan:   Lipid Clinic Consultation  Chief Complaint:  No chief complaint on file.    Exam Regularity:  RRR Edema:  neg Respirations:  Normal    Carotid Bruits:  neg Xanthomas:  neg General Appearance:  alert, oriented, no acute distress Mood/Affect:  normal  HPI:  Dyslipidemia      Component Value Date/Time   CHOL 99 11/29/2012 0955   TRIG 209* 08/18/2012 0850   HDL 23* 08/18/2012 0850   VLDL 42* 08/18/2012 0850   CHOLHDL 6.9 08/18/2012 0850    Assessment: CHD/CHF Risk Equivalents:  CAD [place] Estd 74yrs risk:    NCEP Risk Factors Present:  HTN, family history, low HDL and age Primary Problem(s):  LDL or LDL-P elevated and HDL or HDL-P decreased  Current NCEP Goals: LDL Goal < 70 HDL Goal >/= 40 Tg Goal < 150 Non-HDL Goal < 100  Secondary cause of hyperlipidemia present:  none Low fat diet followed?  No -  Low carb diet followed?  Yes -   Exercises daily with zumba and walking  Recommendations: Changes in lipid medication(s):  Stop fenofibrate due to Scr elevation and CKD status and normal TG.  Continue Crestor 10mg  qd and add Benefiber to food and other fiber sources.  Decrease potassium rich foods since K is elevated at 6.0.  Need to re-check in 2 weeks. Add daily  Vitamin D3 2000IU Recheck Lipid Panel:  6 weeks Other labs needed:  TSH and A1c and BMP  Time spent counseling patient:  30  Physician time spent with patient:    Referring Provider:  Laurance Flatten   PharmD:  Memory Argue, [facility]

## 2013-02-06 ENCOUNTER — Telehealth: Payer: Self-pay | Admitting: Family Medicine

## 2013-02-07 MED ORDER — ROSUVASTATIN CALCIUM 10 MG PO TABS: 10.0000 mg | ORAL_TABLET | Freq: Every day | ORAL | Status: DC

## 2013-02-24 ENCOUNTER — Other Ambulatory Visit: Payer: Self-pay | Admitting: Family Medicine

## 2013-03-10 ENCOUNTER — Ambulatory Visit (INDEPENDENT_AMBULATORY_CARE_PROVIDER_SITE_OTHER): Payer: Medicare Other | Admitting: Family Medicine

## 2013-03-10 ENCOUNTER — Encounter: Payer: Self-pay | Admitting: Family Medicine

## 2013-03-10 VITALS — BP 136/79 | HR 92 | Temp 97.2°F | Ht 63.0 in | Wt 109.0 lb

## 2013-03-10 DIAGNOSIS — M899 Disorder of bone, unspecified: Secondary | ICD-10-CM

## 2013-03-10 DIAGNOSIS — M8580 Other specified disorders of bone density and structure, unspecified site: Secondary | ICD-10-CM

## 2013-03-10 DIAGNOSIS — E785 Hyperlipidemia, unspecified: Secondary | ICD-10-CM

## 2013-03-10 DIAGNOSIS — M889 Osteitis deformans of unspecified bone: Secondary | ICD-10-CM

## 2013-03-10 DIAGNOSIS — E559 Vitamin D deficiency, unspecified: Secondary | ICD-10-CM | POA: Insufficient documentation

## 2013-03-10 DIAGNOSIS — D638 Anemia in other chronic diseases classified elsewhere: Secondary | ICD-10-CM

## 2013-03-10 DIAGNOSIS — J302 Other seasonal allergic rhinitis: Secondary | ICD-10-CM

## 2013-03-10 DIAGNOSIS — K219 Gastro-esophageal reflux disease without esophagitis: Secondary | ICD-10-CM

## 2013-03-10 DIAGNOSIS — E790 Hyperuricemia without signs of inflammatory arthritis and tophaceous disease: Secondary | ICD-10-CM

## 2013-03-10 DIAGNOSIS — J309 Allergic rhinitis, unspecified: Secondary | ICD-10-CM

## 2013-03-10 DIAGNOSIS — I10 Essential (primary) hypertension: Secondary | ICD-10-CM

## 2013-03-10 DIAGNOSIS — R7989 Other specified abnormal findings of blood chemistry: Secondary | ICD-10-CM

## 2013-03-10 DIAGNOSIS — M109 Gout, unspecified: Secondary | ICD-10-CM

## 2013-03-10 LAB — POCT CBC
Granulocyte percent: 78.2 %G (ref 37–80)
HCT, POC: 33.1 % — AB (ref 37.7–47.9)
Hemoglobin: 11 g/dL — AB (ref 12.2–16.2)
Lymph, poc: 1.4 (ref 0.6–3.4)
MCH, POC: 30.7 pg (ref 27–31.2)
MCHC: 33.2 g/dL (ref 31.8–35.4)
MCV: 92.5 fL (ref 80–97)
MPV: 8.4 fL (ref 0–99.8)
POC Granulocyte: 6.3 (ref 2–6.9)
POC LYMPH PERCENT: 18 %L (ref 10–50)
Platelet Count, POC: 288 10*3/uL (ref 142–424)
RBC: 3.6 M/uL — AB (ref 4.04–5.48)
RDW, POC: 12.8 %
WBC: 8 10*3/uL (ref 4.6–10.2)

## 2013-03-10 MED ORDER — ROSUVASTATIN CALCIUM 10 MG PO TABS: 10.0000 mg | ORAL_TABLET | Freq: Every day | ORAL | Status: DC

## 2013-03-10 NOTE — Progress Notes (Signed)
Patient ID: Diane Carlson, female   DOB: 01-Oct-1945, 67 y.o.   MRN: [id number] SUBJECTIVE: CC: Chief Complaint  Patient presents with  . Follow-up    3 month f/u on chronic medical condition    HPI: Patient is here for follow up of hyperlipidemia/Vit D def/HTN/anemia/gout/: denies Headache;denies Chest Pain;denies weakness;denies Shortness of Breath and orthopnea;denies Visual changes;denies palpitations;denies cough;denies pedal edema;denies symptoms of TIA or stroke;deniesClaudication symptoms. admits to Compliance with medications; denies Problems with medications.   Past Medical History  Diagnosis Date  . Hx of appendectomy   . FH: TAH-BSO (total abdominal hysterectomy and bilateral salpingo-oophorectomy)   . Paget's disease   . Osteopenia   . Essential hypertension, benign   . Hyperlipidemia   . GERD (gastroesophageal reflux disease)   . Kidney disease   . Gout   . Cholesteatoma   . Allergy   . Vitamin D deficiency    Past Surgical History  Procedure Laterality Date  . Appendectomy    . Carotid endoectomy Left   . Abdominal hysterectomy     History   Social History  . Marital Status: Married    Spouse Name: N/A    Number of Children: N/A  . Years of Education: N/A   Occupational History  . Not on file.   Social History Main Topics  . Smoking status: Former Smoker -- 1.00 packs/day    Types: Cigarettes    Quit date: 08/25/2009  . Smokeless tobacco: Not on file  . Alcohol Use: No  . Drug Use: No  . Sexual Activity: Not on file   Other Topics Concern  . Not on file   Social History Narrative  . No narrative on file   Family History  Problem Relation Age of Onset  . Heart disease Mother   . Heart disease Father    Current Outpatient Prescriptions on File Prior to Visit  Medication Sig Dispense Refill  . amLODipine (NORVASC) 10 MG tablet TAKE ONE TABLET BY MOUTH ONE TIME DAILY  30 tablet  2  . aspirin 81 MG tablet Take 81 mg by mouth daily.         [name] desloratadine (CLARINEX REDITAB) 5 MG disintegrating tablet Take 1 tablet (5 mg total) by mouth daily.  30 tablet  3  . fish oil-omega-3 fatty acids 1000 MG capsule Take 2 g by mouth daily.      [name] ULORIC 40 MG tablet TAKE ONE TABLET BY MOUTH ONE TIME DAILY  30 tablet  5  . fluticasone (FLONASE) 50 MCG/ACT nasal spray Place 2 sprays into the nose daily.  16 g  6   No current facility-administered medications on file prior to visit.   Allergies  Allergen Reactions  . Cephalexin   . Pravastatin     Muscle aches  . Sulfa Antibiotics   . Zetia [Ezetimibe]     mylagias   Immunization History  Administered Date(s) Administered  . Influenza Whole 12/28/2009  . Pneumococcal Polysaccharide-23 08/25/2012  . Tdap 09/04/2010   Prior to Admission medications   Medication Sig Start Date End Date Taking? Authorizing Provider  amLODipine (NORVASC) 10 MG tablet TAKE ONE TABLET BY MOUTH ONE TIME DAILY 02/24/13   Vernie Shanks, MD  aspirin 81 MG tablet Take 81 mg by mouth daily.      Historical Provider, MD  desloratadine (CLARINEX REDITAB) 5 MG disintegrating tablet Take 1 tablet (5 mg total) by mouth daily. 11/29/12   Vernie Shanks, MD  fish oil-omega-3  fatty acids 1000 MG capsule Take 2 g by mouth daily.    Historical Provider, MD  fluticasone (FLONASE) 50 MCG/ACT nasal spray Place 2 sprays into the nose daily. 11/29/12   Vernie Shanks, MD  rosuvastatin (CRESTOR) 10 MG tablet Take 1 tablet (10 mg total) by mouth daily. 02/07/13   Lysbeth Penner, FNP  ULORIC 40 MG tablet TAKE ONE TABLET BY MOUTH ONE TIME DAILY 08/25/12   Vernie Shanks, MD     ROS: As above in the HPI. All other systems are stable or negative.  OBJECTIVE: APPEARANCE:  Patient in no acute distress.The patient appeared well nourished and normally developed. Acyanotic. Waist: VITAL SIGNS:BP 136/79  Pulse 92  Temp(Src) 97.2 F (36.2 C) (Oral)  Ht 5\' 3"  (1.6 m)  Wt 109 lb (49.442 kg)  BMI 19.31 kg/m2 WM  SKIN: warm  and  Dry without overt rashes, tattoos and scars  HEAD and Neck: without JVD, Head and scalp: normal Eyes:No scleral icterus. Fundi normal, eye movements normal. Ears: Auricle normal, canal normal, Tympanic membranes normal, insufflation normal. Nose: normal Throat: normal Neck & thyroid: normal  CHEST & LUNGS: Chest wall: normal Lungs: Clear  CVS: Reveals the PMI to be normally located. Regular rhythm, First and Second Heart sounds are normal,  absence of murmurs, rubs or gallops. Peripheral vasculature: Radial pulses: normal Dorsal pedis pulses: normal Posterior pulses: normal  ABDOMEN:  Appearance: normal Benign, no organomegaly, no masses, no Abdominal Aortic enlargement. No Guarding , no rebound. No Bruits. Bowel sounds: normal  RECTAL: N/A GU: N/A  EXTREMETIES: nonedematous.  MUSCULOSKELETAL:  Spine: normal Joints: intact  NEUROLOGIC: oriented to time,place and person; nonfocal. Strength is normal Sensory is normal Reflexes are normal Cranial Nerves are normal.  ASSESSMENT: Elevated uric acid in blood - Plan: Uric acid  Hypertension - Plan: POCT CBC, CMP14+EGFR  Vitamin D deficiency - Plan: Vit D  25 hydroxy (rtn osteoporosis monitoring)  Hyperlipidemia - Plan: NMR, lipoprofile  Anemia of chronic disease  GERD (gastroesophageal reflux disease)  Osteitis deformans  Seasonal allergic rhinitis  Osteopenia  Gout  Essential hypertension, benign  PLAN: Consider zostavax. Same medications. Healthy lifestyle.  Orders Placed This Encounter  Procedures  . CMP14+EGFR  . Uric acid  . NMR, lipoprofile  . Vit D  25 hydroxy (rtn osteoporosis monitoring)  . POCT CBC   Meds ordered this encounter  Medications  . Garlic [id number] MG CAPS    Sig: Take by mouth.  . rosuvastatin (CRESTOR) 10 MG tablet    Sig: Take 1 tablet (10 mg total) by mouth daily.    Dispense:  35 tablet    Refill:  0    Lot [id number]  Exp 05-2015   Medications Discontinued  During This Encounter  Medication Reason  . rosuvastatin (CRESTOR) 10 MG tablet Reorder   Return in about 3 months (around 06/08/2013) for Recheck medical problems.  Shaneal Barasch P. Jacelyn Grip, M.D.

## 2013-03-10 NOTE — Patient Instructions (Signed)
Consider Shingles Vaccine Herpes Zoster Virus Vaccine What is this medicine? HERPES ZOSTER VIRUS VACCINE (HUR peez ZOS ter vahy ruhs vak SEEN) is a vaccine. It is used to prevent shingles in adults 66 years old and over. This vaccine is not used to treat shingles or nerve pain from shingles. This medicine may be used for other purposes; ask your health care provider or pharmacist if you have questions. COMMON BRAND NAME(S): Zostavax What should I tell my health care provider before I take this medicine? They need to know if you have any of these conditions: -cancer like leukemia or lymphoma -immune system problems or therapy -infection with fever -tuberculosis -an unusual or allergic reaction to vaccines, neomycin, gelatin, other medicines, foods, dyes, or preservatives -pregnant or trying to get pregnant -breast-feeding How should I use this medicine? This vaccine is for injection under the skin. It is given by a health care professional. Talk to your pediatrician regarding the use of this medicine in children. This medicine is not approved for use in children. Overdosage: If you think you have taken too much of this medicine contact a poison control center or emergency room at once. NOTE: This medicine is only for you. Do not share this medicine with others. What if I miss a dose? This does not apply. What may interact with this medicine? Do not take this medicine with any of the following medications: -adalimumab -anakinra -etanercept -infliximab -medicines to treat cancer -medicines that suppress your immune system This medicine may also interact with the following medications: -immunoglobulins -steroid medicines like prednisone or cortisone This list may not describe all possible interactions. Give your health care provider a list of all the medicines, herbs, non-prescription drugs, or dietary supplements you use. Also tell them if you smoke, drink alcohol, or use illegal drugs.  Some items may interact with your medicine. What should I watch for while using this medicine? Visit your doctor for regular check ups. This vaccine, like all vaccines, may not fully protect everyone. After receiving this vaccine it may be possible to pass chickenpox infection to others. Avoid people with immune system problems, pregnant women who have not had chickenpox, and newborns of women who have not had chickenpox. Talk to your doctor for more information. What side effects may I notice from receiving this medicine? Side effects that you should report to your doctor or health care professional as soon as possible: -allergic reactions like skin rash, itching or hives, swelling of the face, lips, or tongue -breathing problems -feeling faint or lightheaded, falls -fever, flu-like symptoms -pain, tingling, numbness in the hands or feet -swelling of the ankles, feet, hands -unusually weak or tired Side effects that usually do not require medical attention (report to your doctor or health care professional if they continue or are bothersome): -aches or pains -chickenpox-like rash -diarrhea -headache -loss of appetite -nausea, vomiting -redness, pain, swelling at site where injected -runny nose This list may not describe all possible side effects. Call your doctor for medical advice about side effects. You may report side effects to FDA at 1-800-FDA-1088. Where should I keep my medicine? This drug is given in a hospital or clinic and will not be stored at home. NOTE: This sheet is a summary. It may not cover all possible information. If you have questions about this medicine, talk to your doctor, pharmacist, or health care provider.  2014, Elsevier/Gold Standard. (2009-09-02 17:43:50)

## 2013-03-12 LAB — CMP14+EGFR
ALT: 27 IU/L (ref 0–32)
AST: 30 IU/L (ref 0–40)
Albumin/Globulin Ratio: 1.5 (ref 1.1–2.5)
Albumin: 4.3 g/dL (ref 3.6–4.8)
Alkaline Phosphatase: 245 IU/L — ABNORMAL HIGH (ref 39–117)
BUN/Creatinine Ratio: 11 (ref 11–26)
BUN: 19 mg/dL (ref 8–27)
CO2: 21 mmol/L (ref 18–29)
Calcium: 9.8 mg/dL (ref 8.6–10.2)
Chloride: 107 mmol/L (ref 97–108)
Creatinine, Ser: 1.71 mg/dL — ABNORMAL HIGH (ref 0.57–1.00)
GFR calc Af Amer: 35 mL/min/{1.73_m2} — ABNORMAL LOW (ref >59)
GFR calc non Af Amer: 31 mL/min/{1.73_m2} — ABNORMAL LOW (ref >59)
Globulin, Total: 2.8 g/dL (ref 1.5–4.5)
Glucose: 101 mg/dL — ABNORMAL HIGH (ref 65–99)
Potassium: 5.9 mmol/L (ref 3.5–5.2)
Sodium: 145 mmol/L — ABNORMAL HIGH (ref 134–144)
Total Bilirubin: 0.2 mg/dL (ref 0.0–1.2)
Total Protein: 7.1 g/dL (ref 6.0–8.5)

## 2013-03-12 LAB — URIC ACID: Uric Acid: 6.6 mg/dL (ref 2.5–7.1)

## 2013-03-12 LAB — NMR, LIPOPROFILE
Cholesterol: 135 mg/dL (ref <200)
HDL Cholesterol by NMR: 53 mg/dL (ref >=40)
HDL Particle Number: 38.9 umol/L (ref >=30.5)
LDL Particle Number: 952 nmol/L (ref <1000)
LDL Size: 20.4 nm — ABNORMAL LOW (ref >20.5)
LDLC SERPL CALC-MCNC: 54 mg/dL (ref <100)
LP-IR Score: 52 — ABNORMAL HIGH (ref <=45)
Small LDL Particle Number: 660 nmol/L — ABNORMAL HIGH (ref <=527)
Triglycerides by NMR: 140 mg/dL (ref <150)

## 2013-03-12 LAB — VITAMIN D 25 HYDROXY (VIT D DEFICIENCY, FRACTURES): Vit D, 25-Hydroxy: 30.5 ng/mL (ref 30.0–100.0)

## 2013-03-13 ENCOUNTER — Other Ambulatory Visit (INDEPENDENT_AMBULATORY_CARE_PROVIDER_SITE_OTHER): Payer: Medicare Other

## 2013-03-13 DIAGNOSIS — R7989 Other specified abnormal findings of blood chemistry: Secondary | ICD-10-CM

## 2013-03-13 NOTE — Progress Notes (Signed)
Patient came in for labs only.

## 2013-03-13 NOTE — Progress Notes (Signed)
Quick Note:  Call Patient Labs that are abnormal: Potassium is too high  The rest are at goal  Recommendations: Needs a stat potassium. I believe whoever was on call for the weekend already told her to get a stat potassium.   ______

## 2013-03-14 ENCOUNTER — Ambulatory Visit (INDEPENDENT_AMBULATORY_CARE_PROVIDER_SITE_OTHER): Payer: Medicare Other | Admitting: Family Medicine

## 2013-03-14 VITALS — BP 132/75 | HR 80 | Temp 96.8°F | Ht 63.0 in | Wt 110.0 lb

## 2013-03-14 DIAGNOSIS — B9689 Other specified bacterial agents as the cause of diseases classified elsewhere: Secondary | ICD-10-CM | POA: Insufficient documentation

## 2013-03-14 DIAGNOSIS — M889 Osteitis deformans of unspecified bone: Secondary | ICD-10-CM

## 2013-03-14 DIAGNOSIS — E785 Hyperlipidemia, unspecified: Secondary | ICD-10-CM

## 2013-03-14 DIAGNOSIS — E875 Hyperkalemia: Secondary | ICD-10-CM

## 2013-03-14 DIAGNOSIS — I10 Essential (primary) hypertension: Secondary | ICD-10-CM

## 2013-03-14 DIAGNOSIS — J019 Acute sinusitis, unspecified: Secondary | ICD-10-CM

## 2013-03-14 DIAGNOSIS — E559 Vitamin D deficiency, unspecified: Secondary | ICD-10-CM

## 2013-03-14 DIAGNOSIS — N289 Disorder of kidney and ureter, unspecified: Secondary | ICD-10-CM

## 2013-03-14 DIAGNOSIS — D638 Anemia in other chronic diseases classified elsewhere: Secondary | ICD-10-CM

## 2013-03-14 LAB — POTASSIUM: Potassium: 5 mmol/L (ref 3.5–5.2)

## 2013-03-14 MED ORDER — AMOXICILLIN 875 MG PO TABS: 875.0000 mg | ORAL_TABLET | Freq: Two times a day (BID) | ORAL | Status: DC

## 2013-03-14 NOTE — Progress Notes (Signed)
Patient ID: Diane Carlson, female   DOB: [birth year], 67 y.o.   MRN: [id number] SUBJECTIVE: CC: Chief Complaint  Patient presents with  . Sinusitis  . Headache    HPI: Has a sinus infection started 3 days ago with scratchy throat and runny nose. Thinks  She needs antibiotics because her paranasal sinuses are sore to touch and has a little headache. No neurologic symptoms or signs. Thick phlegm out of her nose.  Also had lab work on recent visit with elevated potassium. Had a repeat lab yesterday and the K was normal.   Amoxil usually works for her.  Past Medical History  Diagnosis Date  . Hx of appendectomy   . FH: TAH-BSO (total abdominal hysterectomy and bilateral salpingo-oophorectomy)   . Paget's disease   . Osteopenia   . Essential hypertension, benign   . Hyperlipidemia   . GERD (gastroesophageal reflux disease)   . Kidney disease   . Gout   . Cholesteatoma   . Allergy   . Vitamin D deficiency    Past Surgical History  Procedure Laterality Date  . Appendectomy    . Carotid endoectomy Left   . Abdominal hysterectomy     History   Social History  . Marital Status: Married    Spouse Name: N/A    Number of Children: N/A  . Years of Education: N/A   Occupational History  . Not on file.   Social History Main Topics  . Smoking status: Former Smoker -- 1.00 packs/day    Types: Cigarettes    Quit date: 08/25/2009  . Smokeless tobacco: Not on file  . Alcohol Use: No  . Drug Use: No  . Sexual Activity: Not on file   Other Topics Concern  . Not on file   Social History Narrative  . No narrative on file   Family History  Problem Relation Age of Onset  . Heart disease Mother   . Heart disease Father    Current Outpatient Prescriptions on File Prior to Visit  Medication Sig Dispense Refill  . amLODipine (NORVASC) 10 MG tablet TAKE ONE TABLET BY MOUTH ONE TIME DAILY  30 tablet  2  . aspirin 81 MG tablet Take 81 mg by mouth daily.        [name] desloratadine  (CLARINEX REDITAB) 5 MG disintegrating tablet Take 1 tablet (5 mg total) by mouth daily.  30 tablet  3  . fish oil-omega-3 fatty acids 1000 MG capsule Take 2 g by mouth daily.      . fluticasone (FLONASE) 50 MCG/ACT nasal spray Place 2 sprays into the nose daily.  16 g  6  . Garlic [id number] MG CAPS Take by mouth.      . rosuvastatin (CRESTOR) 10 MG tablet Take 1 tablet (10 mg total) by mouth daily.  35 tablet  0  . ULORIC 40 MG tablet TAKE ONE TABLET BY MOUTH ONE TIME DAILY  30 tablet  5   No current facility-administered medications on file prior to visit.   Allergies  Allergen Reactions  . Cephalexin   . Pravastatin     Muscle aches  . Sulfa Antibiotics   . Zetia [Ezetimibe]     mylagias   Immunization History  Administered Date(s) Administered  . Influenza Whole 12/28/2009  . Pneumococcal Polysaccharide-23 08/25/2012  . Tdap 09/04/2010   Prior to Admission medications   Medication Sig Start Date End Date Taking? Authorizing Provider  amLODipine (NORVASC) 10 MG tablet TAKE ONE TABLET BY MOUTH  ONE TIME DAILY 02/24/13  Yes Vernie Shanks, MD  aspirin 81 MG tablet Take 81 mg by mouth daily.     Yes Historical Provider, MD  desloratadine (CLARINEX REDITAB) 5 MG disintegrating tablet Take 1 tablet (5 mg total) by mouth daily. 11/29/12  Yes Vernie Shanks, MD  fish oil-omega-3 fatty acids 1000 MG capsule Take 2 g by mouth daily.   Yes Historical Provider, MD  fluticasone (FLONASE) 50 MCG/ACT nasal spray Place 2 sprays into the nose daily. 11/29/12  Yes Vernie Shanks, MD  Garlic [id number] MG CAPS Take by mouth.   Yes Historical Provider, MD  rosuvastatin (CRESTOR) 10 MG tablet Take 1 tablet (10 mg total) by mouth daily. 03/10/13  Yes Vernie Shanks, MD  ULORIC 40 MG tablet TAKE ONE TABLET BY MOUTH ONE TIME DAILY 08/25/12  Yes Vernie Shanks, MD     ROS: As above in the HPI. All other systems are stable or negative.  OBJECTIVE: APPEARANCE:  Patient in no acute distress.The patient appeared  well nourished and normally developed. Acyanotic. Waist: VITAL SIGNS:BP 132/75  Pulse 80  Temp(Src) 96.8 F (36 C) (Oral)  Ht 5\' 3"  (1.6 m)  Wt 110 lb (49.896 kg)  BMI 19.49 kg/m2 WF congested  SKIN: warm and  Dry without overt rashes, tattoos and scars  HEAD and Neck: without JVD, Head and scalp: normal Eyes:No scleral icterus. Fundi normal, eye movements normal. Ears: Auricle normal, canal normal, Tympanic membranes normal, insufflation normal. Nose: paranasal sinus tenderness. coryza Throat: normal Neck & thyroid: normal  CHEST & LUNGS: Chest wall: normal Lungs: Clear. Cough from the postnasal drip.  CVS: Reveals the PMI to be normally located. Regular rhythm, First and Second Heart sounds are normal,  absence of murmurs, rubs or gallops. Peripheral vasculature: Radial pulses: normal Dorsal pedis pulses: normal Posterior pulses: normal  ABDOMEN:  Appearance: normal Benign, no organomegaly, no masses, no Abdominal Aortic enlargement. No Guarding , no rebound. No Bruits. Bowel sounds: normal  RECTAL: N/A GU: N/A  NEUROLOGIC: oriented to time,place and person; nonfocal. Results for orders placed in visit on 03/13/13  POTASSIUM      Result Value Range   Potassium 5.0  3.5 - 5.2 mmol/L    ASSESSMENT: Sinusitis, acute - Plan: amoxicillin (AMOXIL) 875 MG tablet  Anemia of chronic disease - better  Essential hypertension, benign  Kidney disease  Osteitis deformans  Vitamin D deficiency  Hyperlipidemia  Hyperkalemia  PLAN: Reviewed her labs with her.  No orders of the defined types were placed in this encounter.   Meds ordered this encounter  Medications  . amoxicillin (AMOXIL) 875 MG tablet    Sig: Take 1 tablet (875 mg total) by mouth 2 (two) times daily.    Dispense:  20 tablet    Refill:  0   There are no discontinued medications. Return for Recheck medical problems. Patient has appointment already.  Bassam Dresch P. Jacelyn Grip, M.D.

## 2013-03-14 NOTE — Progress Notes (Signed)
Quick Note:  Call patient. Labs normal. The high potassium was probably a lab error.  No change in plan. ______

## 2013-04-05 ENCOUNTER — Other Ambulatory Visit (INDEPENDENT_AMBULATORY_CARE_PROVIDER_SITE_OTHER): Payer: Medicare HMO

## 2013-04-05 DIAGNOSIS — R7989 Other specified abnormal findings of blood chemistry: Secondary | ICD-10-CM

## 2013-04-05 NOTE — Progress Notes (Signed)
Patient came in for labs only.

## 2013-04-06 LAB — BMP8+EGFR
BUN/Creatinine Ratio: 12 (ref 11–26)
BUN: 22 mg/dL (ref 8–27)
CO2: 21 mmol/L (ref 18–29)
Calcium: 9.7 mg/dL (ref 8.6–10.2)
Chloride: 102 mmol/L (ref 97–108)
Creatinine, Ser: 1.79 mg/dL — ABNORMAL HIGH (ref 0.57–1.00)
GFR calc Af Amer: 34 mL/min/{1.73_m2} — ABNORMAL LOW (ref >59)
GFR calc non Af Amer: 29 mL/min/{1.73_m2} — ABNORMAL LOW (ref >59)
Glucose: 130 mg/dL — ABNORMAL HIGH (ref 65–99)
Potassium: 4.9 mmol/L (ref 3.5–5.2)
Sodium: 140 mmol/L (ref 134–144)

## 2013-04-26 ENCOUNTER — Telehealth: Payer: Self-pay | Admitting: Family Medicine

## 2013-04-27 NOTE — Telephone Encounter (Signed)
Pt notified Crestor samples available to pickup.

## 2013-05-22 ENCOUNTER — Other Ambulatory Visit: Payer: Self-pay | Admitting: Family Medicine

## 2013-05-26 ENCOUNTER — Telehealth: Payer: Self-pay | Admitting: Family Medicine

## 2013-05-26 NOTE — Telephone Encounter (Signed)
Okay samples. Alaney Witter P. Jacelyn Grip, M.D.

## 2013-05-29 NOTE — Telephone Encounter (Signed)
Samples up front 

## 2013-06-16 ENCOUNTER — Ambulatory Visit (INDEPENDENT_AMBULATORY_CARE_PROVIDER_SITE_OTHER): Payer: Medicare HMO | Admitting: Family Medicine

## 2013-06-16 ENCOUNTER — Encounter: Payer: Self-pay | Admitting: Family Medicine

## 2013-06-16 ENCOUNTER — Encounter (INDEPENDENT_AMBULATORY_CARE_PROVIDER_SITE_OTHER): Payer: Self-pay

## 2013-06-16 VITALS — BP 128/75 | HR 94 | Temp 97.5°F | Ht 63.0 in | Wt 108.2 lb

## 2013-06-16 DIAGNOSIS — D638 Anemia in other chronic diseases classified elsewhere: Secondary | ICD-10-CM

## 2013-06-16 DIAGNOSIS — E559 Vitamin D deficiency, unspecified: Secondary | ICD-10-CM

## 2013-06-16 DIAGNOSIS — J302 Other seasonal allergic rhinitis: Secondary | ICD-10-CM

## 2013-06-16 DIAGNOSIS — M8580 Other specified disorders of bone density and structure, unspecified site: Secondary | ICD-10-CM

## 2013-06-16 DIAGNOSIS — M899 Disorder of bone, unspecified: Secondary | ICD-10-CM

## 2013-06-16 DIAGNOSIS — N289 Disorder of kidney and ureter, unspecified: Secondary | ICD-10-CM

## 2013-06-16 DIAGNOSIS — M109 Gout, unspecified: Secondary | ICD-10-CM

## 2013-06-16 DIAGNOSIS — M949 Disorder of cartilage, unspecified: Secondary | ICD-10-CM

## 2013-06-16 DIAGNOSIS — E785 Hyperlipidemia, unspecified: Secondary | ICD-10-CM

## 2013-06-16 DIAGNOSIS — K219 Gastro-esophageal reflux disease without esophagitis: Secondary | ICD-10-CM

## 2013-06-16 DIAGNOSIS — J309 Allergic rhinitis, unspecified: Secondary | ICD-10-CM

## 2013-06-16 DIAGNOSIS — I10 Essential (primary) hypertension: Secondary | ICD-10-CM

## 2013-06-16 LAB — POCT CBC
Granulocyte percent: 69.2 %G (ref 37–80)
HCT, POC: 37.5 % — AB (ref 37.7–47.9)
Hemoglobin: 11.9 g/dL — AB (ref 12.2–16.2)
Lymph, poc: 1.6 (ref 0.6–3.4)
MCH, POC: 28.2 pg (ref 27–31.2)
MCHC: 31.6 g/dL — AB (ref 31.8–35.4)
MCV: 89.1 fL (ref 80–97)
MPV: 7.7 fL (ref 0–99.8)
POC Granulocyte: 5 (ref 2–6.9)
POC LYMPH PERCENT: 21.7 %L (ref 10–50)
Platelet Count, POC: 272 10*3/uL (ref 142–424)
RBC: 4.2 M/uL (ref 4.04–5.48)
RDW, POC: 13.6 %
WBC: 7.2 10*3/uL (ref 4.6–10.2)

## 2013-06-16 MED ORDER — ROSUVASTATIN CALCIUM 10 MG PO TABS: 10.0000 mg | ORAL_TABLET | Freq: Every day | ORAL | Status: DC

## 2013-06-16 MED ORDER — AMLODIPINE BESYLATE 10 MG PO TABS: ORAL_TABLET | ORAL | Status: DC

## 2013-06-16 MED ORDER — FLUTICASONE PROPIONATE 50 MCG/ACT NA SUSP: 2.0000 | Freq: Every day | NASAL | Status: DC

## 2013-06-16 MED ORDER — FEBUXOSTAT 40 MG PO TABS: ORAL_TABLET | ORAL | Status: DC

## 2013-06-16 MED ORDER — DESLORATADINE 5 MG PO TBDP: 5.0000 mg | ORAL_TABLET | Freq: Every day | ORAL | Status: DC

## 2013-06-16 NOTE — Patient Instructions (Addendum)
Purine Restricted Diet A low-purine diet consists of foods that reduce uric acid made in your body. INDICATIONS FOR USE  Your caregiver may ask you to follow a low-purine diet to reduce gout flairs.  GUIDELINES  Avoid high-purine foods, including all alcohol, yeast extracts taken as supplements, and sauces made from meats (like gravy). Do not eat high-purine meats, including anchovies, sardines, herring, mussels, tuna, codfish, scallops, trout, haddock, bacon, organ meats, tripe, goose, wild game, and sweetbreads.  Grains  Allowed/Recommended: All, except those listed to consume in moderation.  Consume in Moderation: Oatmeal ( cup uncooked daily), wheat bran or germ ( cup daily), and whole grains. Vegetables  Allowed/Recommended: All, except those listed to consume in moderation.  Consume in Moderation: Asparagus, cauliflower, spinach, mushrooms, and green peas ( cup daily). Fruit  Allowed/Recommended: All.  Consume in Moderation: None. Meat and Meat Substitutes  Allowed/Recommended: Eggs, nuts, and peanut butter.  Consume in Moderation: Limit to 4 to 6 oz daily. Avoid high-purine meats. Lentils, peas, and dried beans (1 cup daily). Milk  Allowed/Recommended: All. Choose low-fat or skim when possible.  Consume in Moderation: None. Fats and Oils  Allowed/Recommended: All.  Consume in Moderation: None. Beverages  Allowed/Recommended: All, except those listed to avoid.  Avoid: All alcohol. Condiments/Miscellaneous  Allowed/Recommended: All, except those listed to consume in moderation.  Consume in Moderation: Bouillon and meat-based broths and soups. Document Released: 07/11/2010 Document Revised: 06/08/2011 Document Reviewed: 07/11/2010 [employer] [facility] [employer] Patient Information 2014 [place].        Dr Paula Libra Recommendations  For nutrition information, I recommend books:  1).Eat to Live by Dr Excell Seltzer. 2).Prevent and Reverse Heart Disease by Dr Karl Luke. 3) Dr Janene Harvey Book:  Program to Reverse Diabetes  Exercise recommendations are:  If unable to walk, then the patient can exercise in a chair 3 times a day. By flapping arms like a bird gently and raising legs outwards to the front.  If ambulatory, the patient can go for walks for 30 minutes 3 times a week. Then increase the intensity and duration as tolerated.  Goal is to try to attain exercise frequency to 5 times a week.  If applicable: Best to perform resistance exercises (machines or weights) 2 days a week and cardio type exercises 3 days per week.    Use [employer] for the gas.

## 2013-06-16 NOTE — Progress Notes (Signed)
Patient ID: Diane Carlson, female   DOB: 1945/04/07, 68 y.o.   MRN: [id number] SUBJECTIVE: CC: Chief Complaint  Patient presents with  . Follow-up    3 month follow up chronic problems      HPI: Patient is here for follow up of hyperlipidemia/HTN/gout/: denies Headache;denies Chest Pain;denies weakness;denies Shortness of Breath and orthopnea;denies Visual changes;denies palpitations;denies cough;denies pedal edema;denies symptoms of TIA or stroke;deniesClaudication symptoms. admits to Compliance with medications; denies Problems with medications.  Gets gaseous with beans  Allergies starting to act up. The clarinex works best. Needs meds refiilled.   Past Medical History  Diagnosis Date  . Hx of appendectomy   . FH: TAH-BSO (total abdominal hysterectomy and bilateral salpingo-oophorectomy)   . Paget's disease   . Osteopenia   . Essential hypertension, benign   . Hyperlipidemia   . GERD (gastroesophageal reflux disease)   . Kidney disease   . Gout   . Cholesteatoma   . Allergy   . Vitamin D deficiency    Past Surgical History  Procedure Laterality Date  . Appendectomy    . Carotid endoectomy Left   . Abdominal hysterectomy     History   Social History  . Marital Status: Married    Spouse Name: N/A    Number of Children: N/A  . Years of Education: N/A   Occupational History  . Not on file.   Social History Main Topics  . Smoking status: Former Smoker -- 1.00 packs/day    Types: Cigarettes    Quit date: 08/25/2009  . Smokeless tobacco: Not on file  . Alcohol Use: No  . Drug Use: No  . Sexual Activity: Not on file   Other Topics Concern  . Not on file   Social History Narrative  . No narrative on file   Family History  Problem Relation Age of Onset  . Heart disease Mother   . Heart disease Father    Current Outpatient Prescriptions on File Prior to Visit  Medication Sig Dispense Refill  . aspirin 81 MG tablet Take 81 mg by mouth daily.        .  fish oil-omega-3 fatty acids 1000 MG capsule Take 2 g by mouth daily.      . Garlic 1000 MG CAPS Take by mouth.      [name] amoxicillin (AMOXIL) 875 MG tablet Take 1 tablet (875 mg total) by mouth 2 (two) times daily.  20 tablet  0   No current facility-administered medications on file prior to visit.   Allergies  Allergen Reactions  . Cephalexin   . Pravastatin     Muscle aches  . Sulfa Antibiotics   . Zetia [Ezetimibe]     mylagias   Immunization History  Administered Date(s) Administered  . Influenza Whole 12/28/2009  . Pneumococcal Polysaccharide-23 08/25/2012  . Tdap 09/04/2010  . Zoster 03/10/2013   Prior to Admission medications   Medication Sig Start Date End Date Taking? Authorizing Provider  amLODipine (NORVASC) 10 MG tablet TAKE ONE TABLET BY MOUTH ONE TIME DAILY   Yes Ileana Ladd, MD  aspirin 81 MG tablet Take 81 mg by mouth daily.     Yes Historical Provider, MD  cholecalciferol (VITAMIN D) 1000 UNITS tablet Take 1,000 Units by mouth daily.   Yes Historical Provider, MD  febuxostat (ULORIC) 40 MG tablet TAKE ONE TABLET BY MOUTH ONE TIME weekly 08/25/12  Yes Ileana Ladd, MD  fish oil-omega-3 fatty acids 1000 MG capsule Take 2 g  by mouth daily.   Yes Historical Provider, MD  Garlic 1000 MG CAPS Take by mouth.   Yes Historical Provider, MD  rosuvastatin (CRESTOR) 10 MG tablet Take 1 tablet (10 mg total) by mouth daily. 03/10/13  Yes Ileana Ladd, MD  amoxicillin (AMOXIL) 875 MG tablet Take 1 tablet (875 mg total) by mouth 2 (two) times daily. 03/14/13   Ileana Ladd, MD  desloratadine (CLARINEX REDITAB) 5 MG disintegrating tablet Take 1 tablet (5 mg total) by mouth daily. 11/29/12   Ileana Ladd, MD  fluticasone (FLONASE) 50 MCG/ACT nasal spray Place 2 sprays into the nose daily. 11/29/12   Ileana Ladd, MD     ROS: As above in the HPI. All other systems are stable or negative.  OBJECTIVE: APPEARANCE:  Patient in no acute distress.The patient appeared well  nourished and normally developed. Acyanotic. Waist: VITAL SIGNS:BP 128/75  Pulse 94  Temp(Src) 97.5 F (36.4 C) (Oral)  Ht 5\' 3"  (1.6 m)  Wt 108 lb 3.2 oz (49.079 kg)  BMI 19.17 kg/m2  WF looks well  SKIN: warm and  Dry without overt rashes, tattoos and scars  HEAD and Neck: without JVD, Head and scalp: normal Eyes:No scleral icterus. Fundi normal, eye movements normal. Ears: Auricle normal, canal normal, Tympanic membranes normal, insufflation normal. Nose: nasal congestion and sniffles Throat: normal Neck & thyroid: normal  CHEST & LUNGS: Chest wall: normal Lungs: Clear  CVS: Reveals the PMI to be normally located. Regular rhythm, First and Second Heart sounds are normal,  absence of murmurs, rubs or gallops. Peripheral vasculature: Radial pulses: normal Dorsal pedis pulses: normal Posterior pulses: normal  ABDOMEN:  Appearance: normal Benign, no organomegaly, no masses, no Abdominal Aortic enlargement. No Guarding , no rebound. No Bruits. Bowel sounds: normal  RECTAL: N/A GU: N/A  EXTREMETIES: nonedematous.  MUSCULOSKELETAL:  Spine: normal Joints: intact  NEUROLOGIC: oriented to time,place and person; nonfocal. Strength is normal Sensory is normal Reflexes are normal Cranial Nerves are normal.  Results for orders placed in visit on 04/05/13  BMP8+EGFR      Result Value Ref Range   Glucose 130 (*) 65 - 99 mg/dL   BUN 22  8 - 27 mg/dL   Creatinine, Ser 2.13 (*) 0.57 - 1.00 mg/dL   GFR calc non Af Amer 29 (*) >59 mL/min/1.73   GFR calc Af Amer 34 (*) >59 mL/min/1.73   BUN/Creatinine Ratio 12  11 - 26   Sodium 140  134 - 144 mmol/L   Potassium 4.9  3.5 - 5.2 mmol/L   Chloride 102  97 - 108 mmol/L   CO2 21  18 - 29 mmol/L   Calcium 9.7  8.6 - 10.2 mg/dL    ASSESSMENT:  Kidney disease  Hyperlipidemia - Plan: CMP14+EGFR, Lipid panel, rosuvastatin (CRESTOR) 10 MG tablet  Essential hypertension, benign - Plan: CMP14+EGFR, amLODipine (NORVASC) 10  MG tablet  Anemia of chronic disease - Plan: POCT CBC, CMP14+EGFR  Vitamin D deficiency - Plan: Vit D  25 hydroxy (rtn osteoporosis monitoring)  Osteopenia  Gout - Plan: Uric acid, febuxostat (ULORIC) 40 MG tablet  GERD (gastroesophageal reflux disease)  Seasonal allergic rhinitis - Plan: fluticasone (FLONASE) 50 MCG/ACT nasal spray, desloratadine (CLARINEX REDITAB) 5 MG disintegrating tablet Flatulence with beans.   PLAN: Low purine diet      Dr Woodroe Mode Recommendations  For nutrition information, I recommend books:  1).Eat to Live by Dr Monico Hoar. 2).Prevent and Reverse Heart Disease by Dr Elijah Birk  [place]. 3) Dr Katherina Right Book:  Program to Reverse Diabetes  Exercise recommendations are:  If unable to walk, then the patient can exercise in a chair 3 times a day. By flapping arms like a bird gently and raising legs outwards to the front.  If ambulatory, the patient can go for walks for 30 minutes 3 times a week. Then increase the intensity and duration as tolerated.  Goal is to try to attain exercise frequency to 5 times a week.  If applicable: Best to perform resistance exercises (machines or weights) 2 days a week and cardio type exercises 3 days per week.  Use OTC Beano for the flatulence with beans. Orders Placed This Encounter  Procedures  . CMP14+EGFR  . Lipid panel  . Vit D  25 hydroxy (rtn osteoporosis monitoring)  . Uric acid  . POCT CBC   Meds ordered this encounter  Medications  . DISCONTD: febuxostat (ULORIC) 40 MG tablet    Sig: TAKE ONE TABLET BY MOUTH ONE TIME weekly  . cholecalciferol (VITAMIN D) 1000 UNITS tablet    Sig: Take 1,000 Units by mouth daily.  . febuxostat (ULORIC) 40 MG tablet    Sig: TAKE ONE TABLET BY MOUTH ONE TIME weekly    Dispense:  30 tablet    Refill:  5  . rosuvastatin (CRESTOR) 10 MG tablet    Sig: Take 1 tablet (10 mg total) by mouth daily.    Dispense:  35 tablet    Refill:  5    Lot [id number]  Exp  05-2015  . amLODipine (NORVASC) 10 MG tablet    Sig: TAKE ONE TABLET BY MOUTH ONE TIME DAILY    Dispense:  30 tablet    Refill:  5  . fluticasone (FLONASE) 50 MCG/ACT nasal spray    Sig: Place 2 sprays into both nostrils daily.    Dispense:  16 g    Refill:  6  . desloratadine (CLARINEX REDITAB) 5 MG disintegrating tablet    Sig: Take 1 tablet (5 mg total) by mouth daily.    Dispense:  30 tablet    Refill:  5   Medications Discontinued During This Encounter  Medication Reason  . ULORIC 40 MG tablet   . febuxostat (ULORIC) 40 MG tablet Reorder  . rosuvastatin (CRESTOR) 10 MG tablet Reorder  . amLODipine (NORVASC) 10 MG tablet Reorder  . fluticasone (FLONASE) 50 MCG/ACT nasal spray Reorder  . desloratadine (CLARINEX REDITAB) 5 MG disintegrating tablet Reorder   Return in about 4 months (around 10/16/2013) for Recheck medical problems.  Arieanna Pressey P. [place] Charon, M.D.

## 2013-06-17 LAB — LIPID PANEL
Chol/HDL Ratio: 2.9 ratio units (ref 0.0–4.4)
Cholesterol, Total: 140 mg/dL (ref 100–199)
HDL: 48 mg/dL (ref >39)
LDL Calculated: 62 mg/dL (ref 0–99)
Triglycerides: 149 mg/dL (ref 0–149)
VLDL Cholesterol Cal: 30 mg/dL (ref 5–40)

## 2013-06-17 LAB — CMP14+EGFR
ALT: 15 IU/L (ref 0–32)
AST: 23 IU/L (ref 0–40)
Albumin/Globulin Ratio: 1.9 (ref 1.1–2.5)
Albumin: 4.7 g/dL (ref 3.6–4.8)
Alkaline Phosphatase: 245 IU/L — ABNORMAL HIGH (ref 39–117)
BUN/Creatinine Ratio: 14 (ref 11–26)
BUN: 24 mg/dL (ref 8–27)
CO2: 20 mmol/L (ref 18–29)
Calcium: 9.9 mg/dL (ref 8.7–10.3)
Chloride: 104 mmol/L (ref 97–108)
Creatinine, Ser: 1.75 mg/dL — ABNORMAL HIGH (ref 0.57–1.00)
GFR calc Af Amer: 34 mL/min/{1.73_m2} — ABNORMAL LOW (ref >59)
GFR calc non Af Amer: 30 mL/min/{1.73_m2} — ABNORMAL LOW (ref >59)
Globulin, Total: 2.5 g/dL (ref 1.5–4.5)
Glucose: 99 mg/dL (ref 65–99)
Potassium: 4.6 mmol/L (ref 3.5–5.2)
Sodium: 142 mmol/L (ref 134–144)
Total Bilirubin: 0.3 mg/dL (ref 0.0–1.2)
Total Protein: 7.2 g/dL (ref 6.0–8.5)

## 2013-06-17 LAB — VITAMIN D 25 HYDROXY (VIT D DEFICIENCY, FRACTURES): Vit D, 25-Hydroxy: 36.2 ng/mL (ref 30.0–100.0)

## 2013-06-17 LAB — URIC ACID: Uric Acid: 7.2 mg/dL — ABNORMAL HIGH (ref 2.5–7.1)

## 2013-06-19 ENCOUNTER — Telehealth: Payer: Self-pay | Admitting: Family Medicine

## 2013-06-19 NOTE — Telephone Encounter (Signed)
Samples up front patient aware  

## 2013-07-17 ENCOUNTER — Telehealth: Payer: Self-pay | Admitting: Family Medicine

## 2013-07-17 NOTE — Telephone Encounter (Signed)
Only have one box of uloric no crestor

## 2013-08-04 ENCOUNTER — Telehealth: Payer: Self-pay | Admitting: Family Medicine

## 2013-08-04 NOTE — Telephone Encounter (Signed)
Patient aware.

## 2013-08-28 ENCOUNTER — Telehealth: Payer: Self-pay | Admitting: Family

## 2013-08-30 ENCOUNTER — Ambulatory Visit: Payer: Medicare HMO | Admitting: *Deleted

## 2013-08-30 MED ORDER — ROSUVASTATIN CALCIUM 20 MG PO TABS: 10.0000 mg | ORAL_TABLET | Freq: Every day | ORAL | Status: DC

## 2013-08-30 NOTE — Telephone Encounter (Signed)
Patient aware to pick up samples ? ?

## 2013-08-30 NOTE — Progress Notes (Signed)
Patient aware that samples are available to be picked up at front desk.

## 2013-09-18 ENCOUNTER — Ambulatory Visit (INDEPENDENT_AMBULATORY_CARE_PROVIDER_SITE_OTHER): Payer: Medicare HMO | Admitting: Family

## 2013-09-18 ENCOUNTER — Encounter: Payer: Self-pay | Admitting: Family

## 2013-09-18 VITALS — BP 122/77 | HR 86 | Temp 98.8°F | Ht 63.0 in | Wt 107.0 lb

## 2013-09-18 DIAGNOSIS — E785 Hyperlipidemia, unspecified: Secondary | ICD-10-CM

## 2013-09-18 DIAGNOSIS — Z842 Family history of other diseases of the genitourinary system: Secondary | ICD-10-CM

## 2013-09-18 DIAGNOSIS — M899 Disorder of bone, unspecified: Secondary | ICD-10-CM

## 2013-09-18 DIAGNOSIS — I10 Essential (primary) hypertension: Secondary | ICD-10-CM

## 2013-09-18 DIAGNOSIS — Z8739 Personal history of other diseases of the musculoskeletal system and connective tissue: Secondary | ICD-10-CM

## 2013-09-18 DIAGNOSIS — J302 Other seasonal allergic rhinitis: Secondary | ICD-10-CM

## 2013-09-18 DIAGNOSIS — J309 Allergic rhinitis, unspecified: Secondary | ICD-10-CM

## 2013-09-18 DIAGNOSIS — M8580 Other specified disorders of bone density and structure, unspecified site: Secondary | ICD-10-CM

## 2013-09-18 DIAGNOSIS — M949 Disorder of cartilage, unspecified: Secondary | ICD-10-CM

## 2013-09-18 MED ORDER — FLUTICASONE PROPIONATE 50 MCG/ACT NA SUSP: 2.0000 | Freq: Every day | NASAL | Status: DC

## 2013-09-18 MED ORDER — ROSUVASTATIN CALCIUM 10 MG PO TABS
10.0000 mg | ORAL_TABLET | Freq: Every day | ORAL | Status: DC
Start: 2013-09-18 — End: 2013-10-24

## 2013-09-18 MED ORDER — AMLODIPINE BESYLATE 10 MG PO TABS: ORAL_TABLET | ORAL | Status: DC

## 2013-09-18 NOTE — Patient Instructions (Signed)

## 2013-09-18 NOTE — Progress Notes (Signed)
Subjective:    Patient ID: Diane Carlson, female    DOB: 10/22/1945, 68 y.o.   MRN: [id number]  Hyperlipidemia This is a chronic problem. The current episode started more than 1 year ago. The problem is controlled. Recent lipid tests were reviewed and are normal. She has no history of diabetes or hypothyroidism. Pertinent negatives include no leg pain, myalgias or shortness of breath. Current antihyperlipidemic treatment includes statins. The current treatment provides significant improvement of lipids. Risk factors for coronary artery disease include dyslipidemia, hypertension, post-menopausal and family history.  Hypertension This is a chronic problem. The current episode started more than 1 year ago. The problem has been waxing and waning since onset. The problem is controlled. Pertinent negatives include no anxiety, headaches, palpitations, peripheral edema or shortness of breath. Risk factors for coronary artery disease include dyslipidemia, family history and post-menopausal state. Past treatments include calcium channel blockers. The current treatment provides moderate improvement. There is no history of kidney disease, CAD/MI or a thyroid problem. There is no history of sleep apnea.  GOUT Pt currently taking Uloric three times a week. Pt states she can't remember her last attack. No complaints at this time    Review of Systems  HENT: Negative.   Respiratory: Negative.  Negative for shortness of breath.   Cardiovascular: Negative.  Negative for palpitations.  Gastrointestinal: Negative.   Genitourinary: Negative.   Musculoskeletal: Negative.  Negative for myalgias.  Neurological: Negative.  Negative for headaches.  Hematological: Negative.   All other systems reviewed and are negative.      Objective:   Physical Exam  Vitals reviewed. Constitutional: She is oriented to person, place, and time. She appears well-developed and well-nourished. No distress.  HENT:  Head:  Normocephalic and atraumatic.  Right Ear: External ear normal.  Mouth/Throat: Oropharynx is clear and moist.  Eyes: Pupils are equal, round, and reactive to light.  Neck: Normal range of motion. Neck supple. No thyromegaly present.  Cardiovascular: Normal rate, regular rhythm, normal heart sounds and intact distal pulses.   No murmur heard. Pulmonary/Chest: Effort normal and breath sounds normal. No respiratory distress. She has no wheezes.  Abdominal: Soft. Bowel sounds are normal. She exhibits no distension. There is no tenderness.  Musculoskeletal: Normal range of motion. She exhibits no edema and no tenderness.  Neurological: She is alert and oriented to person, place, and time. She has normal reflexes. No cranial nerve deficit.  Skin: Skin is warm and dry.  Psychiatric: She has a normal mood and affect. Her behavior is normal. Judgment and thought content normal.    BP 122/77  Pulse 86  Temp(Src) 98.8 F (37.1 C) (Oral)  Ht 5' 3" (1.6 m)  Wt 107 lb (48.535 kg)  BMI 18.96 kg/m2       Assessment & Plan:  1. Essential hypertension, benign - amLODipine (NORVASC) 10 MG tablet; TAKE ONE TABLET BY MOUTH ONE TIME DAILY  Dispense: 30 tablet; Refill: 5 - CMP14+EGFR  2. FH: TAH-BSO (total abdominal hysterectomy and bilateral salpingo-oophorectomy)  3. Hyperlipidemia - rosuvastatin (CRESTOR) 10 MG tablet; Take 1 tablet (10 mg total) by mouth daily.  Dispense: 35 tablet; Refill: 5 - Lipid panel  4. Osteopenia - Vit D  25 hydroxy (rtn osteoporosis monitoring)  5. Seasonal allergic rhinitis - fluticasone (FLONASE) 50 MCG/ACT nasal spray; Place 2 sprays into both nostrils daily.  Dispense: 16 g; Refill: 6  6. Hx of gout - Uric acid   Continue all meds Labs pending Health Maintenance reviewed  Diet and exercise encouraged RTO 3 months  Christy Hawks, FNP   

## 2013-09-19 ENCOUNTER — Encounter: Payer: Self-pay | Admitting: *Deleted

## 2013-09-19 LAB — CMP14+EGFR
A/G RATIO: 1.6 (ref 1.1–2.5)
ALK PHOS: 240 IU/L — AB (ref 39–117)
ALT: 13 IU/L (ref 0–32)
AST: 20 IU/L (ref 0–40)
Albumin: 4.3 g/dL (ref 3.6–4.8)
BILIRUBIN TOTAL: 0.2 mg/dL (ref 0.0–1.2)
BUN / CREAT RATIO: 12 (ref 11–26)
BUN: 19 mg/dL (ref 8–27)
CALCIUM: 9.5 mg/dL (ref 8.7–10.3)
CO2: 22 mmol/L (ref 18–29)
Chloride: 104 mmol/L (ref 97–108)
Creatinine, Ser: 1.59 mg/dL — ABNORMAL HIGH (ref 0.57–1.00)
GFR calc Af Amer: 39 mL/min/{1.73_m2} — ABNORMAL LOW (ref >59)
GFR, EST NON AFRICAN AMERICAN: 34 mL/min/{1.73_m2} — AB (ref >59)
GLOBULIN, TOTAL: 2.7 g/dL (ref 1.5–4.5)
Glucose: 96 mg/dL (ref 65–99)
Potassium: 4.2 mmol/L (ref 3.5–5.2)
SODIUM: 144 mmol/L (ref 134–144)
Total Protein: 7 g/dL (ref 6.0–8.5)

## 2013-09-19 LAB — LIPID PANEL
Chol/HDL Ratio: 2.5 ratio units (ref 0.0–4.4)
Cholesterol, Total: 130 mg/dL (ref 100–199)
HDL: 51 mg/dL (ref >39)
LDL CALC: 53 mg/dL (ref 0–99)
TRIGLYCERIDES: 130 mg/dL (ref 0–149)
VLDL Cholesterol Cal: 26 mg/dL (ref 5–40)

## 2013-09-19 LAB — URIC ACID: URIC ACID: 6.3 mg/dL (ref 2.5–7.1)

## 2013-09-19 LAB — VITAMIN D 25 HYDROXY (VIT D DEFICIENCY, FRACTURES): Vit D, 25-Hydroxy: 36.2 ng/mL (ref 30.0–100.0)

## 2013-09-26 ENCOUNTER — Telehealth: Payer: Self-pay | Admitting: Family

## 2013-09-26 NOTE — Telephone Encounter (Signed)
Left message for pt stating we are out of crestor samples at this time

## 2013-10-12 ENCOUNTER — Encounter: Payer: Self-pay | Admitting: *Deleted

## 2013-10-16 ENCOUNTER — Telehealth: Payer: Self-pay | Admitting: Family

## 2013-10-16 NOTE — Telephone Encounter (Signed)
Patient aware.

## 2013-10-23 ENCOUNTER — Telehealth: Payer: Self-pay | Admitting: Family

## 2013-10-23 DIAGNOSIS — E785 Hyperlipidemia, unspecified: Secondary | ICD-10-CM

## 2013-10-24 MED ORDER — ROSUVASTATIN CALCIUM 10 MG PO TABS: 10.0000 mg | ORAL_TABLET | Freq: Every day | ORAL | Status: DC

## 2013-10-24 NOTE — Telephone Encounter (Signed)
done

## 2013-10-25 ENCOUNTER — Other Ambulatory Visit: Payer: Self-pay

## 2013-10-25 ENCOUNTER — Telehealth: Payer: Self-pay | Admitting: Family

## 2013-10-25 DIAGNOSIS — E785 Hyperlipidemia, unspecified: Secondary | ICD-10-CM

## 2013-10-25 MED ORDER — ROSUVASTATIN CALCIUM 10 MG PO TABS: 10.0000 mg | ORAL_TABLET | Freq: Every day | ORAL | Status: DC

## 2013-12-14 ENCOUNTER — Other Ambulatory Visit: Payer: Self-pay | Admitting: *Deleted

## 2013-12-14 NOTE — Telephone Encounter (Signed)
Received fax from pharmacy requesting uloric. Has two sets of instructions. On refill request states to take one tab by mouth one time daily and computer states that she takes once a week and another set says she takes 3 times a week. Please advise

## 2013-12-15 MED ORDER — FEBUXOSTAT 40 MG PO TABS: ORAL_TABLET | ORAL | Status: DC

## 2013-12-21 ENCOUNTER — Ambulatory Visit (INDEPENDENT_AMBULATORY_CARE_PROVIDER_SITE_OTHER): Payer: Medicare HMO | Admitting: Family

## 2013-12-21 ENCOUNTER — Encounter: Payer: Self-pay | Admitting: Family

## 2013-12-21 VITALS — BP 131/76 | HR 91 | Temp 97.2°F | Wt 106.4 lb

## 2013-12-21 DIAGNOSIS — I10 Essential (primary) hypertension: Secondary | ICD-10-CM

## 2013-12-21 DIAGNOSIS — E559 Vitamin D deficiency, unspecified: Secondary | ICD-10-CM

## 2013-12-21 DIAGNOSIS — H6192 Disorder of left external ear, unspecified: Secondary | ICD-10-CM

## 2013-12-21 DIAGNOSIS — Z13820 Encounter for screening for osteoporosis: Secondary | ICD-10-CM

## 2013-12-21 DIAGNOSIS — E785 Hyperlipidemia, unspecified: Secondary | ICD-10-CM

## 2013-12-21 DIAGNOSIS — N289 Disorder of kidney and ureter, unspecified: Secondary | ICD-10-CM

## 2013-12-21 DIAGNOSIS — H6190 Disorder of external ear, unspecified, unspecified ear: Secondary | ICD-10-CM

## 2013-12-21 DIAGNOSIS — M109 Gout, unspecified: Secondary | ICD-10-CM

## 2013-12-21 LAB — POCT CBC
Granulocyte percent: 73.7 %G (ref 37–80)
HCT, POC: 36 % — AB (ref 37.7–47.9)
HEMOGLOBIN: 11.9 g/dL — AB (ref 12.2–16.2)
LYMPH, POC: 1.7 (ref 0.6–3.4)
MCH: 29.1 pg (ref 27–31.2)
MCHC: 32.9 g/dL (ref 31.8–35.4)
MCV: 88.6 fL (ref 80–97)
MPV: 8 fL (ref 0–99.8)
PLATELET COUNT, POC: 284 10*3/uL (ref 142–424)
POC GRANULOCYTE: 6.1 (ref 2–6.9)
POC LYMPH %: 20.6 % (ref 10–50)
RBC: 4.1 M/uL (ref 4.04–5.48)
RDW, POC: 13.2 %
WBC: 8.3 10*3/uL (ref 4.6–10.2)

## 2013-12-21 MED ORDER — MUPIROCIN 2 % EX OINT: 1.0000 "application " | TOPICAL_OINTMENT | Freq: Two times a day (BID) | CUTANEOUS | Status: DC

## 2013-12-21 NOTE — Patient Instructions (Signed)

## 2013-12-21 NOTE — Progress Notes (Signed)
Subjective:    Patient ID: Diane Carlson, female    DOB: [birth year], 68 y.o.   MRN: [id number]  Hypertension This is a chronic problem. The current episode started more than 1 year ago. The problem has been resolved since onset. The problem is controlled. Pertinent negatives include no anxiety, headaches, palpitations, peripheral edema or shortness of breath. Risk factors for coronary artery disease include dyslipidemia, family history and post-menopausal state. Past treatments include calcium channel blockers. The current treatment provides moderate improvement. Hypertensive end-organ damage includes CVA. There is no history of kidney disease, CAD/MI, heart failure or a thyroid problem. There is no history of sleep apnea.  Hyperlipidemia This is a chronic problem. The current episode started more than 1 year ago. The problem is controlled. Recent lipid tests were reviewed and are normal. She has no history of diabetes or hypothyroidism. Pertinent negatives include no leg pain, myalgias or shortness of breath. Current antihyperlipidemic treatment includes statins. The current treatment provides significant improvement of lipids. Risk factors for coronary artery disease include dyslipidemia, hypertension, post-menopausal and family history.      Review of Systems  Constitutional: Negative.   HENT: Negative.   Eyes: Negative.   Respiratory: Negative.  Negative for shortness of breath.   Cardiovascular: Negative.  Negative for palpitations.  Gastrointestinal: Negative.   Endocrine: Negative.   Genitourinary: Negative.   Musculoskeletal: Negative.  Negative for myalgias.  Neurological: Negative.  Negative for headaches.  Hematological: Negative.   Psychiatric/Behavioral: Negative.   All other systems reviewed and are negative.      Objective:   Physical Exam  Vitals reviewed. Constitutional: She is oriented to person, place, and time. She appears well-developed and well-nourished. No  distress.  HENT:  Head: Normocephalic and atraumatic.  Right Ear: There is tenderness (erythemas lesion on external upper ear).  Left Ear: External ear normal.  Ears:  Nose: Nose normal.  Mouth/Throat: Oropharynx is clear and moist.  Eyes: Pupils are equal, round, and reactive to light.  Neck: Normal range of motion. Neck supple. No thyromegaly present.  Cardiovascular: Normal rate, regular rhythm, normal heart sounds and intact distal pulses.   No murmur heard. Pulmonary/Chest: Effort normal and breath sounds normal. No respiratory distress. She has no wheezes.  Abdominal: Soft. Bowel sounds are normal. She exhibits no distension. There is no tenderness.  Musculoskeletal: Normal range of motion. She exhibits no edema and no tenderness.  Neurological: She is alert and oriented to person, place, and time. She has normal reflexes. No cranial nerve deficit.  Skin: Skin is warm and dry.  Psychiatric: She has a normal mood and affect. Her behavior is normal. Judgment and thought content normal.    BP 131/76  Pulse 91  Temp(Src) 97.2 F (36.2 C) (Oral)  Wt 106 lb 6.4 oz (48.263 kg)       Assessment & Plan:  1. Essential hypertension, benign - POCT CBC - BMP8+EGFR - Hepatic function panel  2. Hyperlipidemia - POCT CBC - NMR, lipoprofile  3. Kidney disease - POCT CBC - BMP8+EGFR  4. Vitamin D deficiency - POCT CBC - Vit D  25 hydroxy (rtn osteoporosis monitoring)  5. Gout of left foot, unspecified cause, unspecified chronicity  6. Osteoporosis screening - DG Bone Density; Future  7. Lesion of external ear, left - mupirocin ointment (BACTROBAN) 2 %; Place 1 application into the nose 2 (two) times daily.  Dispense: 22 g; Refill: 0   Continue all meds Labs pending Health Maintenance reviewed Diet and  exercise encouraged RTO 6 months  Evelina Dun, FNP

## 2013-12-22 ENCOUNTER — Ambulatory Visit: Payer: Medicare HMO | Admitting: Family

## 2013-12-22 LAB — HEPATIC FUNCTION PANEL
ALT: 13 IU/L (ref 0–32)
AST: 22 IU/L (ref 0–40)
Albumin: 4.6 g/dL (ref 3.6–4.8)
Alkaline Phosphatase: 229 IU/L — ABNORMAL HIGH (ref 39–117)
BILIRUBIN TOTAL: 0.3 mg/dL (ref 0.0–1.2)
Bilirubin, Direct: 0.09 mg/dL (ref 0.00–0.40)
TOTAL PROTEIN: 7.1 g/dL (ref 6.0–8.5)

## 2013-12-22 LAB — NMR, LIPOPROFILE
CHOLESTEROL: 128 mg/dL (ref 100–199)
HDL CHOLESTEROL BY NMR: 57 mg/dL (ref >39)
HDL Particle Number: 37.7 umol/L (ref >=30.5)
LDL Particle Number: 639 nmol/L (ref <1000)
LDL Size: 20.1 nm (ref >20.5)
LDLC SERPL CALC-MCNC: 48 mg/dL (ref 0–99)
LP-IR Score: 49 — ABNORMAL HIGH (ref <=45)
Small LDL Particle Number: 476 nmol/L (ref <=527)
TRIGLYCERIDES BY NMR: 116 mg/dL (ref 0–149)

## 2013-12-22 LAB — BMP8+EGFR
BUN/Creatinine Ratio: 9 — ABNORMAL LOW (ref 11–26)
BUN: 16 mg/dL (ref 8–27)
CALCIUM: 9.8 mg/dL (ref 8.7–10.3)
CO2: 23 mmol/L (ref 18–29)
Chloride: 106 mmol/L (ref 97–108)
Creatinine, Ser: 1.72 mg/dL — ABNORMAL HIGH (ref 0.57–1.00)
GFR calc Af Amer: 35 mL/min/{1.73_m2} — ABNORMAL LOW (ref >59)
GFR, EST NON AFRICAN AMERICAN: 31 mL/min/{1.73_m2} — AB (ref >59)
GLUCOSE: 99 mg/dL (ref 65–99)
Potassium: 5.3 mmol/L — ABNORMAL HIGH (ref 3.5–5.2)
SODIUM: 145 mmol/L — AB (ref 134–144)

## 2013-12-22 LAB — VITAMIN D 25 HYDROXY (VIT D DEFICIENCY, FRACTURES): Vit D, 25-Hydroxy: 49.4 ng/mL (ref 30.0–100.0)

## 2014-01-15 ENCOUNTER — Ambulatory Visit (INDEPENDENT_AMBULATORY_CARE_PROVIDER_SITE_OTHER): Payer: Medicare HMO

## 2014-01-15 DIAGNOSIS — Z23 Encounter for immunization: Secondary | ICD-10-CM

## 2014-01-22 ENCOUNTER — Encounter: Payer: Self-pay | Admitting: Family Medicine

## 2014-01-22 ENCOUNTER — Ambulatory Visit (INDEPENDENT_AMBULATORY_CARE_PROVIDER_SITE_OTHER): Payer: Medicare HMO | Admitting: Family Medicine

## 2014-01-22 VITALS — BP 141/74 | HR 93 | Temp 97.6°F | Ht 63.0 in | Wt 109.4 lb

## 2014-01-22 DIAGNOSIS — J206 Acute bronchitis due to rhinovirus: Secondary | ICD-10-CM

## 2014-01-22 MED ORDER — AMOXICILLIN 500 MG PO CAPS: 500.0000 mg | ORAL_CAPSULE | Freq: Three times a day (TID) | ORAL | Status: DC

## 2014-01-22 MED ORDER — METHYLPREDNISOLONE ACETATE 80 MG/ML IJ SUSP
80.0000 mg | Freq: Once | INTRAMUSCULAR | Status: AC
  Administered 2014-01-22: 80 mg via INTRAMUSCULAR

## 2014-01-22 MED ORDER — BENZONATATE 100 MG PO CAPS: 100.0000 mg | ORAL_CAPSULE | Freq: Three times a day (TID) | ORAL | Status: DC | PRN

## 2014-01-22 NOTE — Progress Notes (Signed)
   Subjective:    Patient ID: Diane Carlson, female    DOB: [birth year], 68 y.o.   MRN: [id number]  URI  This is a new problem. The current episode started in the past 7 days. The problem has been gradually worsening. There has been no fever. Associated symptoms include congestion, coughing, rhinorrhea and wheezing. She has tried nothing for the symptoms.      Review of Systems  Constitutional: Positive for fatigue.  HENT: Positive for congestion and rhinorrhea.   Respiratory: Positive for cough and wheezing.        Objective:   Physical Exam  Constitutional: She appears well-developed and well-nourished.  HENT:  Head: Normocephalic and atraumatic.  Eyes: Conjunctivae are normal. Pupils are equal, round, and reactive to light.  Neck: Normal range of motion. Neck supple.  Cardiovascular: Normal rate and regular rhythm.   Pulmonary/Chest: Effort normal and breath sounds normal.  Abdominal: Soft. Bowel sounds are normal.          Assessment & Plan:  Acute bronchitis due to Rhinovirus - Plan: amoxicillin (AMOXIL) 500 MG capsule, methylPREDNISolone acetate (DEPO-MEDROL) injection 80 mg, benzonatate (TESSALON PERLES) 100 MG capsule Push po fluids, rest, tylenol and motrin otc prn as directed for fever, arthralgias, and myalgias.  Follow up prn if sx's continue or persist.  Lysbeth Penner FNP

## 2014-02-27 HISTORY — PX: OTHER SURGICAL HISTORY: SHX169

## 2014-03-12 ENCOUNTER — Telehealth: Payer: Self-pay | Admitting: Family

## 2014-03-13 NOTE — Telephone Encounter (Signed)
Appointment rescheduled and letter with appointment date/time was mailed to patient

## 2014-03-14 ENCOUNTER — Other Ambulatory Visit: Payer: Medicare HMO

## 2014-03-14 ENCOUNTER — Ambulatory Visit: Payer: Medicare HMO

## 2014-03-26 ENCOUNTER — Encounter: Payer: Self-pay | Admitting: Physician Assistant

## 2014-03-26 ENCOUNTER — Ambulatory Visit (INDEPENDENT_AMBULATORY_CARE_PROVIDER_SITE_OTHER): Payer: Medicare HMO | Admitting: Physician Assistant

## 2014-03-26 VITALS — BP 139/76 | HR 101 | Temp 97.4°F | Ht 63.0 in | Wt 113.2 lb

## 2014-03-26 DIAGNOSIS — E785 Hyperlipidemia, unspecified: Secondary | ICD-10-CM

## 2014-03-26 DIAGNOSIS — J0111 Acute recurrent frontal sinusitis: Secondary | ICD-10-CM

## 2014-03-26 MED ORDER — METHYLPREDNISOLONE ACETATE 40 MG/ML IJ SUSP
40.0000 mg | Freq: Once | INTRAMUSCULAR | Status: AC
  Administered 2014-03-26: 40 mg via INTRAMUSCULAR

## 2014-03-26 MED ORDER — ROSUVASTATIN CALCIUM 10 MG PO TABS: 10.0000 mg | ORAL_TABLET | Freq: Every day | ORAL | Status: DC

## 2014-03-26 MED ORDER — AMOXICILLIN 500 MG PO CAPS: 500.0000 mg | ORAL_CAPSULE | Freq: Three times a day (TID) | ORAL | Status: DC

## 2014-03-26 NOTE — Addendum Note (Signed)
Addended by: Jamelle Haring on: 03/26/2014 03:08 PM   Modules accepted: Orders

## 2014-03-26 NOTE — Progress Notes (Signed)
Subjective:     Patient ID: Diane Carlson, female   DOB: [birth year], 68 y.o.   MRN: [id number]  HPI Pt with sinus pressure and dry cough x 1 week  Review of Systems  Constitutional: Positive for fever and appetite change. Negative for activity change.  HENT: Positive for postnasal drip, rhinorrhea, sinus pressure and sore throat. Negative for facial swelling.   Respiratory: Positive for cough and chest tightness.        Objective:   Physical Exam  Constitutional: She appears well-developed and well-nourished.  HENT:  Right Ear: External ear normal.  Left Ear: External ear normal.  Mouth/Throat: Oropharynx is clear and moist.  + frontal sinus TTP  Neck: Neck supple.  Cardiovascular: Normal rate, regular rhythm and normal heart sounds.   No murmur heard. Pulmonary/Chest: Breath sounds normal.  Lymphadenopathy:    She has no cervical adenopathy.  Vitals reviewed.      Assessment:     Sinusitis    Plan:     Fluids Cont other meds Amox 500mg  1 po tid #30- has tolerated well prev Depomedrol 40 IM today F/U prn

## 2014-03-26 NOTE — Patient Instructions (Signed)

## 2014-04-23 ENCOUNTER — Other Ambulatory Visit: Payer: Medicare HMO

## 2014-04-23 ENCOUNTER — Other Ambulatory Visit (INDEPENDENT_AMBULATORY_CARE_PROVIDER_SITE_OTHER): Payer: Medicare HMO

## 2014-04-23 DIAGNOSIS — E559 Vitamin D deficiency, unspecified: Secondary | ICD-10-CM

## 2014-04-23 DIAGNOSIS — E785 Hyperlipidemia, unspecified: Secondary | ICD-10-CM

## 2014-04-23 DIAGNOSIS — I1310 Hypertensive heart and chronic kidney disease without heart failure, with stage 1 through stage 4 chronic kidney disease, or unspecified chronic kidney disease: Secondary | ICD-10-CM

## 2014-04-23 DIAGNOSIS — N184 Chronic kidney disease, stage 4 (severe): Secondary | ICD-10-CM

## 2014-04-24 LAB — HEPATIC FUNCTION PANEL
ALBUMIN: 4.2 g/dL (ref 3.6–4.8)
ALK PHOS: 195 IU/L — AB (ref 39–117)
ALT: 18 IU/L (ref 0–32)
AST: 23 IU/L (ref 0–40)
Bilirubin, Direct: 0.09 mg/dL (ref 0.00–0.40)
Total Bilirubin: 0.2 mg/dL (ref 0.0–1.2)
Total Protein: 6.8 g/dL (ref 6.0–8.5)

## 2014-04-24 LAB — BMP8+EGFR
BUN / CREAT RATIO: 12 (ref 11–26)
BUN: 21 mg/dL (ref 8–27)
CO2: 22 mmol/L (ref 18–29)
Calcium: 9.7 mg/dL (ref 8.7–10.3)
Chloride: 105 mmol/L (ref 97–108)
Creatinine, Ser: 1.74 mg/dL — ABNORMAL HIGH (ref 0.57–1.00)
GFR calc Af Amer: 34 mL/min/{1.73_m2} — ABNORMAL LOW (ref >59)
GFR calc non Af Amer: 30 mL/min/{1.73_m2} — ABNORMAL LOW (ref >59)
GLUCOSE: 98 mg/dL (ref 65–99)
Potassium: 5.8 mmol/L — ABNORMAL HIGH (ref 3.5–5.2)
SODIUM: 143 mmol/L (ref 134–144)

## 2014-04-24 LAB — LIPID PANEL
CHOLESTEROL TOTAL: 134 mg/dL (ref 100–199)
Chol/HDL Ratio: 2.1 ratio units (ref 0.0–4.4)
HDL: 63 mg/dL (ref >39)
LDL Calculated: 52 mg/dL (ref 0–99)
Triglycerides: 94 mg/dL (ref 0–149)
VLDL CHOLESTEROL CAL: 19 mg/dL (ref 5–40)

## 2014-04-24 LAB — PARATHYROID HORMONE, INTACT (NO CA): PTH: 70 pg/mL — ABNORMAL HIGH (ref 15–65)

## 2014-04-24 LAB — VITAMIN D 25 HYDROXY (VIT D DEFICIENCY, FRACTURES): VIT D 25 HYDROXY: 38.2 ng/mL (ref 30.0–100.0)

## 2014-04-27 ENCOUNTER — Encounter: Payer: Self-pay | Admitting: Family

## 2014-04-27 ENCOUNTER — Ambulatory Visit (INDEPENDENT_AMBULATORY_CARE_PROVIDER_SITE_OTHER): Payer: Medicare HMO | Admitting: Family

## 2014-04-27 VITALS — BP 141/74 | HR 76 | Temp 97.4°F | Ht 63.0 in | Wt 110.0 lb

## 2014-04-27 DIAGNOSIS — M109 Gout, unspecified: Secondary | ICD-10-CM

## 2014-04-27 DIAGNOSIS — J302 Other seasonal allergic rhinitis: Secondary | ICD-10-CM

## 2014-04-27 DIAGNOSIS — I10 Essential (primary) hypertension: Secondary | ICD-10-CM

## 2014-04-27 DIAGNOSIS — E785 Hyperlipidemia, unspecified: Secondary | ICD-10-CM

## 2014-04-27 DIAGNOSIS — M8580 Other specified disorders of bone density and structure, unspecified site: Secondary | ICD-10-CM

## 2014-04-27 MED ORDER — ROSUVASTATIN CALCIUM 10 MG PO TABS: 10.0000 mg | ORAL_TABLET | Freq: Every day | ORAL | Status: DC

## 2014-04-27 MED ORDER — AMLODIPINE BESYLATE 10 MG PO TABS: ORAL_TABLET | ORAL | Status: DC

## 2014-04-27 NOTE — Progress Notes (Signed)
   Subjective:    Patient ID: Diane Carlson, female    DOB: March 19, 1946, 69 y.o.   MRN: [id number]  Hyperlipidemia This is a chronic problem. The current episode started more than 1 year ago. The problem is controlled. Recent lipid tests were reviewed and are normal. She has no history of diabetes or hypothyroidism. Pertinent negatives include no leg pain, myalgias or shortness of breath. Current antihyperlipidemic treatment includes statins. The current treatment provides significant improvement of lipids. Risk factors for coronary artery disease include dyslipidemia, hypertension, a sedentary lifestyle, post-menopausal and family history.  Hypertension This is a chronic problem. The current episode started more than 1 year ago. The problem has been waxing and waning since onset. The problem is controlled. Pertinent negatives include no anxiety, blurred vision, headaches, palpitations, peripheral edema or shortness of breath. Risk factors for coronary artery disease include dyslipidemia, family history and post-menopausal state. Past treatments include calcium channel blockers. The current treatment provides moderate improvement. There is no history of kidney disease, CAD/MI, CVA, heart failure or a thyroid problem. There is no history of sleep apnea.      Review of Systems  Constitutional: Negative.   HENT: Negative.   Eyes: Negative.  Negative for blurred vision.  Respiratory: Negative.  Negative for shortness of breath.   Cardiovascular: Negative.  Negative for palpitations.  Gastrointestinal: Negative.   Endocrine: Negative.   Genitourinary: Negative.   Musculoskeletal: Negative.  Negative for myalgias.  Neurological: Negative.  Negative for headaches.  Hematological: Negative.   Psychiatric/Behavioral: Negative.   All other systems reviewed and are negative.      Objective:   Physical Exam  Constitutional: She is oriented to person, place, and time. She appears well-developed and  well-nourished. No distress.  HENT:  Head: Normocephalic and atraumatic.  Right Ear: External ear normal.  Left Ear: External ear normal.  Nose: Nose normal.  Mouth/Throat: Oropharynx is clear and moist.  Eyes: Pupils are equal, round, and reactive to light.  Neck: Normal range of motion. Neck supple. No thyromegaly present.  Cardiovascular: Normal rate, regular rhythm, normal heart sounds and intact distal pulses.   No murmur heard. Pulmonary/Chest: Effort normal and breath sounds normal. No respiratory distress. She has no wheezes.  Abdominal: Soft. Bowel sounds are normal. She exhibits no distension. There is no tenderness.  Musculoskeletal: Normal range of motion. She exhibits no edema or tenderness.  Neurological: She is alert and oriented to person, place, and time. She has normal reflexes. No cranial nerve deficit.  Skin: Skin is warm and dry.  Psychiatric: She has a normal mood and affect. Her behavior is normal. Judgment and thought content normal.  Vitals reviewed.  BP 141/74 mmHg  Pulse 76  Temp(Src) 97.4 F (36.3 C) (Oral)  Ht 5\' 3"  (1.6 m)  Wt 110 lb (49.896 kg)  BMI 19.49 kg/m2        Assessment & Plan:  1. Essential hypertension, benign - amLODipine (NORVASC) 10 MG tablet; TAKE ONE TABLET BY MOUTH ONE TIME DAILY  Dispense: 90 tablet; Refill: 4  2. Seasonal allergic rhinitis  3. Osteopenia  4. Hyperlipidemia - rosuvastatin (CRESTOR) 10 MG tablet; Take 1 tablet (10 mg total) by mouth daily.  Dispense: 90 tablet; Refill: 3  5. Gout of left foot, unspecified cause, unspecified chronicity    Continue all meds Labs discussed Health Maintenance reviewed Diet and exercise encouraged RTO 4 months  Evelina Dun, FNP

## 2014-04-27 NOTE — Patient Instructions (Signed)

## 2014-05-01 ENCOUNTER — Other Ambulatory Visit: Payer: Self-pay | Admitting: Family

## 2014-05-01 NOTE — Telephone Encounter (Signed)
Stp advised we are currently out of Crestor samples.

## 2014-05-03 ENCOUNTER — Other Ambulatory Visit (INDEPENDENT_AMBULATORY_CARE_PROVIDER_SITE_OTHER): Payer: Medicare HMO

## 2014-05-03 DIAGNOSIS — E875 Hyperkalemia: Secondary | ICD-10-CM

## 2014-05-03 NOTE — Progress Notes (Signed)
Labs for dr. Thurmond Butts sandford

## 2014-05-04 LAB — RENAL FUNCTION PANEL
ALBUMIN: 4.4 g/dL (ref 3.6–4.8)
BUN/Creatinine Ratio: 10 — ABNORMAL LOW (ref 11–26)
BUN: 16 mg/dL (ref 8–27)
CO2: 22 mmol/L (ref 18–29)
CREATININE: 1.66 mg/dL — AB (ref 0.57–1.00)
Calcium: 9.4 mg/dL (ref 8.7–10.3)
Chloride: 108 mmol/L (ref 97–108)
GFR calc non Af Amer: 32 mL/min/{1.73_m2} — ABNORMAL LOW (ref >59)
GFR, EST AFRICAN AMERICAN: 37 mL/min/{1.73_m2} — AB (ref >59)
Glucose: 97 mg/dL (ref 65–99)
Phosphorus: 3.8 mg/dL (ref 2.5–4.5)
Potassium: 4.8 mmol/L (ref 3.5–5.2)
Sodium: 143 mmol/L (ref 134–144)

## 2014-05-09 ENCOUNTER — Ambulatory Visit (INDEPENDENT_AMBULATORY_CARE_PROVIDER_SITE_OTHER): Payer: Medicare HMO | Admitting: Pharmacist

## 2014-05-09 ENCOUNTER — Ambulatory Visit (INDEPENDENT_AMBULATORY_CARE_PROVIDER_SITE_OTHER): Payer: Medicare HMO

## 2014-05-09 ENCOUNTER — Encounter: Payer: Self-pay | Admitting: Pharmacist

## 2014-05-09 VITALS — Ht 62.5 in | Wt 111.0 lb

## 2014-05-09 DIAGNOSIS — Z13820 Encounter for screening for osteoporosis: Secondary | ICD-10-CM

## 2014-05-09 DIAGNOSIS — M889 Osteitis deformans of unspecified bone: Secondary | ICD-10-CM

## 2014-05-09 DIAGNOSIS — M8580 Other specified disorders of bone density and structure, unspecified site: Secondary | ICD-10-CM

## 2014-05-09 LAB — HM DEXA SCAN

## 2014-05-09 NOTE — Progress Notes (Signed)
Patient ID: Diane Carlson, female   DOB: 1945-05-31, 69 y.o.   MRN: [id number]  [facility] Current Height: Height: 5' 2.5" (158.8 cm)      Max Lifetime Height:  5' 3.75" Current Weight: Weight: 111 lb (50.349 kg)       Ethnicity:Caucasian    HPI: Does pt already have a diagnosis of:  Osteopenia?  Yes Osteoporosis?  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  She also has stage 3B kidney disfunction  Back Pain?  No       Kyphosis?  No Prior fracture?  No Med(s) for Osteoporosis/Osteopenia:  none Med(s) previously tried for Osteoporosis/Osteopenia:  none                                                             PMH: Age at menopause:  Surgical - 30's Hysterectomy?  Yes Oophorectomy?  Yes HRT? Yes - Former.  Type/duration: patient unsure of name Steroid Use?  No Thyroid med?  No History of cancer?  No History of digestive disorders (ie Crohn's)?  No Current or previous eating disorders?  No Last Vitamin D Result:  38.2 (04/23/2014) Last GFR Result:  32 (05/03/2014)   FH/SH: Family history of osteoporosis?  No Parent with history of hip fracture?  No Family history of breast cancer?  No Exercise?  Yes - silver sneakers and Zumba Smoking?  No Alcohol?  No    Calcium Assessment Calcium Intake  # of servings/day  Calcium mg  Milk (8 oz) 0  x  300  = 0  Yogurt (4 oz) 0 x  200 = 0  Cheese (1 oz) 0 x  200 = 0  Other Calcium sources   250mg   Ca supplement 0 = 0   Estimated calcium intake per day 250mg     DEXA Results Date of Test T-Score for AP Spine L1-L4 T-Score for Total Left Hip T-Score for Total Right Hip  05/09/2014 0.2 -1.7 -1.8  06/11/2010 0.0 -1.2 -1.3  02/22/2008 0.6 -1.0 -1.1  12/20/1997 0.2 -1.1 --   FRAX 10 year estimate: Total FX risk:  9.1%  (consider medication if >/= 20%) Hip FX risk:  1.4%  (consider medication if >/= 3%)  Assessment: Osteopenia - stable BMD at spine but decrease BMD at hips.   Low calcium intake from diet Paget's Disease  Recommendations: 1.  Discussed BMD results and discussed fracture risk and Paget's Disease.  Patient has been prescribed alendronate serveral years ago but she did not start.  Considering she is not symptomatic and has a GFR of 30, I do not recommend bisphosphonate therapy at this time 2.  recommend calcium 1200mg  daily through supplementation or diet.  3.  continue weight bearing exercise - 30 minutes at least 4 days per week.   4.  Counseled and educated about fall risk and prevention.  Recheck DEXA:  2 years  Time spent counseling patient:  15 minutes   Cherre Robins, PharmD, CPP

## 2014-05-09 NOTE — Patient Instructions (Signed)

## 2014-08-30 ENCOUNTER — Ambulatory Visit: Payer: Medicare HMO | Admitting: Family

## 2014-08-31 ENCOUNTER — Encounter: Payer: Self-pay | Admitting: Family Medicine

## 2014-08-31 ENCOUNTER — Ambulatory Visit (INDEPENDENT_AMBULATORY_CARE_PROVIDER_SITE_OTHER): Payer: Medicare HMO | Admitting: Family Medicine

## 2014-08-31 VITALS — BP 121/71 | HR 84 | Temp 97.1°F | Ht 63.0 in | Wt 109.2 lb

## 2014-08-31 DIAGNOSIS — E785 Hyperlipidemia, unspecified: Secondary | ICD-10-CM | POA: Diagnosis not present

## 2014-08-31 DIAGNOSIS — M8580 Other specified disorders of bone density and structure, unspecified site: Secondary | ICD-10-CM | POA: Diagnosis not present

## 2014-08-31 DIAGNOSIS — I10 Essential (primary) hypertension: Secondary | ICD-10-CM

## 2014-08-31 DIAGNOSIS — Z23 Encounter for immunization: Secondary | ICD-10-CM

## 2014-08-31 LAB — POCT CBC
Granulocyte percent: 82.5 %G — AB (ref 37–80)
HCT, POC: 41.5 % (ref 37.7–47.9)
HEMOGLOBIN: 12.8 g/dL (ref 12.2–16.2)
Lymph, poc: 1.5 (ref 0.6–3.4)
MCH: 27.6 pg (ref 27–31.2)
MCHC: 30.8 g/dL — AB (ref 31.8–35.4)
MCV: 89.5 fL (ref 80–97)
MPV: 8.8 fL (ref 0–99.8)
PLATELET COUNT, POC: 307 10*3/uL (ref 142–424)
POC GRANULOCYTE: 8.3 — AB (ref 2–6.9)
POC LYMPH PERCENT: 15 %L (ref 10–50)
RBC: 4.64 M/uL (ref 4.04–5.48)
RDW, POC: 13.2 %
WBC: 10.1 10*3/uL (ref 4.6–10.2)

## 2014-08-31 NOTE — Progress Notes (Signed)
Subjective:  Patient ID: Diane Carlson, female    DOB: 07/08/1945  Age: 69 y.o. MRN: [id number]  CC: Hypertension and Hyperlipidemia   HPI Diane Carlson presents for  follow-up of hypertension. Patient has no history of headache chest pain or shortness of breath or recent cough. Patient also denies symptoms of TIA such as numbness weakness lateralizing. Patient checks  blood pressure at home and has not had any elevated readings recently. Patient denies side effects from his medication. States taking it regularly.  Patient also  in for follow-up of elevated cholesterol. Doing well without complaints on current medication. Denies side effects of statin including myalgia and arthralgia and nausea. Also in today for liver function testing. Currently no chest pain, shortness of breath or other cardiovascular related symptoms noted.    History Diane Carlson has a past medical history of appendectomy; FH: TAH-BSO (total abdominal hysterectomy and bilateral salpingo-oophorectomy); Paget's disease; Osteopenia; Essential hypertension, benign; Hyperlipidemia; GERD (gastroesophageal reflux disease); Kidney disease; Gout; Cholesteatoma; Allergy; and Vitamin D deficiency.   She has past surgical history that includes Appendectomy; carotid endoectomy (Left); Abdominal hysterectomy; and cataract (Bilateral, 02/27/14).   Her family history includes Heart disease in her father and mother.She reports that she quit smoking about 5 years ago. Her smoking use included Cigarettes. She smoked 1.00 pack per day. She does not have any smokeless tobacco history on file. She reports that she does not drink alcohol or use illicit drugs.  Current Outpatient Prescriptions on File Prior to Visit  Medication Sig Dispense Refill  . amLODipine (NORVASC) 10 MG tablet TAKE ONE TABLET BY MOUTH ONE TIME DAILY 90 tablet 4  . aspirin 81 MG tablet Take 81 mg by mouth daily.      . cholecalciferol (VITAMIN D) 1000 UNITS tablet Take 1,000  Units by mouth daily.    . fish oil-omega-3 fatty acids 1000 MG capsule Take 2 g by mouth daily.    . Garlic 3300 MG CAPS Take by mouth.    . rosuvastatin (CRESTOR) 10 MG tablet Take 1 tablet (10 mg total) by mouth daily. 90 tablet 3   No current facility-administered medications on file prior to visit.    ROS Review of Systems  Constitutional: Negative for fever, chills, diaphoresis, appetite change, fatigue and unexpected weight change.  HENT: Negative for congestion, ear pain, hearing loss, postnasal drip, rhinorrhea, sneezing, sore throat and trouble swallowing.   Eyes: Negative for pain.  Respiratory: Negative for cough, chest tightness and shortness of breath.   Cardiovascular: Negative for chest pain and palpitations.  Gastrointestinal: Negative for nausea, vomiting, abdominal pain, diarrhea and constipation.  Genitourinary: Negative for dysuria, frequency and menstrual problem.  Musculoskeletal: Negative for joint swelling and arthralgias.  Skin: Negative for rash.  Neurological: Negative for dizziness, weakness, numbness and headaches.  Psychiatric/Behavioral: Negative for dysphoric mood and agitation.    Objective:  BP 121/71 mmHg  Pulse 84  Temp(Src) 97.1 F (36.2 C) (Oral)  Ht 5' 3"  (1.6 m)  Wt 109 lb 3.2 oz (49.533 kg)  BMI 19.35 kg/m2  BP Readings from Last 3 Encounters:  08/31/14 121/71  04/27/14 141/74  03/26/14 139/76    Wt Readings from Last 3 Encounters:  08/31/14 109 lb 3.2 oz (49.533 kg)  05/09/14 111 lb (50.349 kg)  04/27/14 110 lb (49.896 kg)     Physical Exam  Constitutional: She is oriented to person, place, and time. She appears well-developed and well-nourished. No distress.  HENT:  Head: Normocephalic and atraumatic.  Right Ear: External ear normal.  Left Ear: External ear normal.  Nose: Nose normal.  Mouth/Throat: Oropharynx is clear and moist.  Eyes: Conjunctivae and EOM are normal. Pupils are equal, round, and reactive to light.    Neck: Normal range of motion. Neck supple. No thyromegaly present.  Cardiovascular: Normal rate, regular rhythm and normal heart sounds.   No murmur heard. Pulmonary/Chest: Effort normal and breath sounds normal. No respiratory distress. She has no wheezes. She has no rales.  Abdominal: Soft. Bowel sounds are normal. She exhibits no distension. There is no tenderness.  Lymphadenopathy:    She has no cervical adenopathy.  Neurological: She is alert and oriented to person, place, and time. She has normal reflexes.  Skin: Skin is warm and dry.  Psychiatric: She has a normal mood and affect. Her behavior is normal. Judgment and thought content normal.    No results found for: HGBA1C  Lab Results  Component Value Date   WBC 8.3 12/21/2013   HGB 11.9* 12/21/2013   HCT 36.0* 12/21/2013   PLT 317 12/23/2009   GLUCOSE 97 05/03/2014   CHOL 134 04/23/2014   TRIG 94 04/23/2014   HDL 63 04/23/2014   LDLCALC 52 04/23/2014   ALT 18 04/23/2014   AST 23 04/23/2014   NA 143 05/03/2014   K 4.8 05/03/2014   CL 108 05/03/2014   CREATININE 1.66* 05/03/2014   BUN 16 05/03/2014   CO2 22 05/03/2014    No results found.  Assessment & Plan:   Diane Carlson was seen today for hypertension and hyperlipidemia.  Diagnoses and all orders for this visit:  Essential hypertension, benign Orders: -     POCT CBC; Standing -     CMP14+EGFR; Standing -     Cancel: POCT CBC -     Cancel: CMP14+EGFR -     Cancel: POCT CBC -     Cancel: CMP14+EGFR -     Cancel: POCT CBC -     Cancel: CMP14+EGFR -     Cancel: POCT CBC -     Cancel: CMP14+EGFR -     POCT CBC; Standing -     CMP14+EGFR; Standing -     Lipid panel; Standing -     POCT CBC -     CMP14+EGFR -     Lipid panel -     POCT CBC -     CMP14+EGFR -     Lipid panel  Hyperlipidemia Orders: -     CMP14+EGFR; Standing -     Lipid panel; Standing -     Cancel: CMP14+EGFR -     Cancel: Lipid panel -     Cancel: CMP14+EGFR -     Cancel: Lipid  panel -     Cancel: CMP14+EGFR -     Cancel: Lipid panel -     Cancel: CMP14+EGFR -     Cancel: Lipid panel -     CMP14+EGFR; Standing -     Lipid panel; Standing -     CMP14+EGFR -     Lipid panel -     CMP14+EGFR -     Lipid panel  Osteopenia Orders: -     POCT CBC; Standing -     Thyroid Panel With TSH -     Vit D  25 hydroxy (rtn osteoporosis monitoring); Standing -     Cancel: POCT CBC -     Cancel: Vit D  25 hydroxy (rtn osteoporosis monitoring) -  Cancel: POCT CBC -     Cancel: Vit D  25 hydroxy (rtn osteoporosis monitoring) -     Cancel: POCT CBC -     Cancel: POCT CBC -     Vit D  25 hydroxy (rtn osteoporosis monitoring); Standing -     Vit D  25 hydroxy (rtn osteoporosis monitoring) -     Vit D  25 hydroxy (rtn osteoporosis monitoring)  Other orders -     Pneumococcal conjugate vaccine 13-valent   I have discontinued Diane Carlson's febuxostat. I am also having her maintain her aspirin, fish oil-omega-3 fatty acids, Garlic, cholecalciferol, rosuvastatin, and amLODipine.  No orders of the defined types were placed in this encounter.     Follow-up: Return in about 6 months (around 03/02/2015) for CPE.  Claretta Fraise, M.D.

## 2014-09-01 LAB — CMP14+EGFR
ALT: 15 IU/L (ref 0–32)
AST: 21 IU/L (ref 0–40)
Albumin/Globulin Ratio: 1.8 (ref 1.1–2.5)
Albumin: 4.9 g/dL — ABNORMAL HIGH (ref 3.6–4.8)
Alkaline Phosphatase: 202 IU/L — ABNORMAL HIGH (ref 39–117)
BUN / CREAT RATIO: 11 (ref 11–26)
BUN: 19 mg/dL (ref 8–27)
Bilirubin Total: 0.4 mg/dL (ref 0.0–1.2)
CO2: 23 mmol/L (ref 18–29)
CREATININE: 1.77 mg/dL — AB (ref 0.57–1.00)
Calcium: 10 mg/dL (ref 8.7–10.3)
Chloride: 103 mmol/L (ref 97–108)
GFR calc Af Amer: 34 mL/min/{1.73_m2} — ABNORMAL LOW (ref >59)
GFR, EST NON AFRICAN AMERICAN: 29 mL/min/{1.73_m2} — AB (ref >59)
GLUCOSE: 104 mg/dL — AB (ref 65–99)
Globulin, Total: 2.8 g/dL (ref 1.5–4.5)
Potassium: 5.3 mmol/L — ABNORMAL HIGH (ref 3.5–5.2)
SODIUM: 143 mmol/L (ref 134–144)
Total Protein: 7.7 g/dL (ref 6.0–8.5)

## 2014-09-01 LAB — LIPID PANEL
Chol/HDL Ratio: 2.5 ratio units (ref 0.0–4.4)
Cholesterol, Total: 148 mg/dL (ref 100–199)
HDL: 60 mg/dL (ref >39)
LDL Calculated: 61 mg/dL (ref 0–99)
Triglycerides: 135 mg/dL (ref 0–149)
VLDL Cholesterol Cal: 27 mg/dL (ref 5–40)

## 2014-09-01 LAB — THYROID PANEL WITH TSH
Free Thyroxine Index: 2.4 (ref 1.2–4.9)
T3 Uptake Ratio: 23 % — ABNORMAL LOW (ref 24–39)
T4, Total: 10.3 ug/dL (ref 4.5–12.0)
TSH: 1.21 u[IU]/mL (ref 0.450–4.500)

## 2014-09-01 LAB — VITAMIN D 25 HYDROXY (VIT D DEFICIENCY, FRACTURES): Vit D, 25-Hydroxy: 39.3 ng/mL (ref 30.0–100.0)

## 2014-09-05 ENCOUNTER — Telehealth: Payer: Self-pay | Admitting: Family Medicine

## 2014-10-23 ENCOUNTER — Other Ambulatory Visit (INDEPENDENT_AMBULATORY_CARE_PROVIDER_SITE_OTHER): Payer: Medicare HMO

## 2014-10-23 DIAGNOSIS — N184 Chronic kidney disease, stage 4 (severe): Secondary | ICD-10-CM | POA: Diagnosis not present

## 2014-10-23 NOTE — Progress Notes (Signed)
Lab only 

## 2014-10-23 NOTE — Addendum Note (Signed)
Addended by: Selmer Dominion on: 10/23/2014 09:52 AM   Modules accepted: Orders

## 2014-10-23 NOTE — Addendum Note (Signed)
Addended by: Selmer Dominion on: 10/23/2014 09:53 AM   Modules accepted: Orders

## 2014-10-24 LAB — RENAL FUNCTION PANEL
ALBUMIN: 4.5 g/dL (ref 3.6–4.8)
BUN / CREAT RATIO: 10 — AB (ref 11–26)
BUN: 19 mg/dL (ref 8–27)
CALCIUM: 10.1 mg/dL (ref 8.7–10.3)
CO2: 22 mmol/L (ref 18–29)
CREATININE: 1.9 mg/dL — AB (ref 0.57–1.00)
Chloride: 103 mmol/L (ref 97–108)
GFR calc Af Amer: 31 mL/min/{1.73_m2} — ABNORMAL LOW (ref >59)
GFR, EST NON AFRICAN AMERICAN: 27 mL/min/{1.73_m2} — AB (ref >59)
GLUCOSE: 100 mg/dL — AB (ref 65–99)
Phosphorus: 4 mg/dL (ref 2.5–4.5)
Potassium: 6.2 mmol/L (ref 3.5–5.2)
Sodium: 141 mmol/L (ref 134–144)

## 2014-10-24 LAB — HEMOGLOBIN: HEMOGLOBIN: 12 g/dL (ref 11.1–15.9)

## 2014-10-29 ENCOUNTER — Encounter: Payer: Self-pay | Admitting: *Deleted

## 2014-10-29 DIAGNOSIS — E875 Hyperkalemia: Secondary | ICD-10-CM | POA: Diagnosis not present

## 2014-10-29 DIAGNOSIS — N184 Chronic kidney disease, stage 4 (severe): Secondary | ICD-10-CM | POA: Diagnosis not present

## 2014-10-29 DIAGNOSIS — I10 Essential (primary) hypertension: Secondary | ICD-10-CM | POA: Diagnosis not present

## 2014-11-12 ENCOUNTER — Encounter: Payer: Self-pay | Admitting: Family Medicine

## 2014-12-10 ENCOUNTER — Telehealth: Payer: Self-pay

## 2014-12-13 DIAGNOSIS — D225 Melanocytic nevi of trunk: Secondary | ICD-10-CM | POA: Diagnosis not present

## 2014-12-13 DIAGNOSIS — H61009 Unspecified perichondritis of external ear, unspecified ear: Secondary | ICD-10-CM | POA: Diagnosis not present

## 2015-01-11 ENCOUNTER — Ambulatory Visit (INDEPENDENT_AMBULATORY_CARE_PROVIDER_SITE_OTHER): Payer: Commercial Managed Care - HMO

## 2015-01-11 DIAGNOSIS — Z23 Encounter for immunization: Secondary | ICD-10-CM

## 2015-02-26 ENCOUNTER — Other Ambulatory Visit (INDEPENDENT_AMBULATORY_CARE_PROVIDER_SITE_OTHER): Payer: Commercial Managed Care - HMO

## 2015-02-26 DIAGNOSIS — I10 Essential (primary) hypertension: Secondary | ICD-10-CM

## 2015-02-26 DIAGNOSIS — E785 Hyperlipidemia, unspecified: Secondary | ICD-10-CM | POA: Diagnosis not present

## 2015-02-26 NOTE — Progress Notes (Signed)
Lab only 

## 2015-02-27 LAB — LIPID PANEL
CHOL/HDL RATIO: 2.5 ratio (ref 0.0–4.4)
Cholesterol, Total: 130 mg/dL (ref 100–199)
HDL: 52 mg/dL (ref >39)
LDL Calculated: 51 mg/dL (ref 0–99)
Triglycerides: 133 mg/dL (ref 0–149)
VLDL Cholesterol Cal: 27 mg/dL (ref 5–40)

## 2015-02-27 LAB — CMP14+EGFR
A/G RATIO: 1.4 (ref 1.1–2.5)
ALBUMIN: 4.1 g/dL (ref 3.6–4.8)
ALT: 13 IU/L (ref 0–32)
AST: 22 IU/L (ref 0–40)
Alkaline Phosphatase: 225 IU/L — ABNORMAL HIGH (ref 39–117)
BUN / CREAT RATIO: 11 (ref 11–26)
BUN: 20 mg/dL (ref 8–27)
Bilirubin Total: 0.3 mg/dL (ref 0.0–1.2)
CALCIUM: 9.4 mg/dL (ref 8.7–10.3)
CO2: 22 mmol/L (ref 18–29)
Chloride: 105 mmol/L (ref 97–106)
Creatinine, Ser: 1.75 mg/dL — ABNORMAL HIGH (ref 0.57–1.00)
GFR, EST AFRICAN AMERICAN: 34 mL/min/{1.73_m2} — AB (ref >59)
GFR, EST NON AFRICAN AMERICAN: 30 mL/min/{1.73_m2} — AB (ref >59)
GLOBULIN, TOTAL: 2.9 g/dL (ref 1.5–4.5)
Glucose: 100 mg/dL — ABNORMAL HIGH (ref 65–99)
POTASSIUM: 4.6 mmol/L (ref 3.5–5.2)
SODIUM: 141 mmol/L (ref 136–144)
TOTAL PROTEIN: 7 g/dL (ref 6.0–8.5)

## 2015-02-27 LAB — CBC WITH DIFFERENTIAL/PLATELET
BASOS: 1 %
Basophils Absolute: 0.1 10*3/uL (ref 0.0–0.2)
EOS (ABSOLUTE): 0.6 10*3/uL — ABNORMAL HIGH (ref 0.0–0.4)
EOS: 9 %
HEMATOCRIT: 35.9 % (ref 34.0–46.6)
Hemoglobin: 12 g/dL (ref 11.1–15.9)
IMMATURE GRANULOCYTES: 0 %
Immature Grans (Abs): 0 10*3/uL (ref 0.0–0.1)
Lymphocytes Absolute: 1.5 10*3/uL (ref 0.7–3.1)
Lymphs: 23 %
MCH: 29.4 pg (ref 26.6–33.0)
MCHC: 33.4 g/dL (ref 31.5–35.7)
MCV: 88 fL (ref 79–97)
MONOS ABS: 0.5 10*3/uL (ref 0.1–0.9)
Monocytes: 7 %
NEUTROS PCT: 60 %
Neutrophils Absolute: 3.9 10*3/uL (ref 1.4–7.0)
Platelets: 277 10*3/uL (ref 150–379)
RBC: 4.08 x10E6/uL (ref 3.77–5.28)
RDW: 14.1 % (ref 12.3–15.4)
WBC: 6.5 10*3/uL (ref 3.4–10.8)

## 2015-03-04 ENCOUNTER — Encounter: Payer: Self-pay | Admitting: Family Medicine

## 2015-03-04 ENCOUNTER — Ambulatory Visit (INDEPENDENT_AMBULATORY_CARE_PROVIDER_SITE_OTHER): Payer: Commercial Managed Care - HMO | Admitting: Family Medicine

## 2015-03-04 ENCOUNTER — Ambulatory Visit (INDEPENDENT_AMBULATORY_CARE_PROVIDER_SITE_OTHER): Payer: Commercial Managed Care - HMO

## 2015-03-04 VITALS — BP 134/71 | HR 96 | Temp 96.9°F | Ht 63.0 in | Wt 110.2 lb

## 2015-03-04 DIAGNOSIS — R1030 Lower abdominal pain, unspecified: Secondary | ICD-10-CM

## 2015-03-04 DIAGNOSIS — I10 Essential (primary) hypertension: Secondary | ICD-10-CM | POA: Diagnosis not present

## 2015-03-04 DIAGNOSIS — Z0189 Encounter for other specified special examinations: Secondary | ICD-10-CM | POA: Diagnosis not present

## 2015-03-04 DIAGNOSIS — E785 Hyperlipidemia, unspecified: Secondary | ICD-10-CM

## 2015-03-04 DIAGNOSIS — Z0000 Encounter for general adult medical examination without abnormal findings: Secondary | ICD-10-CM

## 2015-03-04 LAB — POCT URINALYSIS DIPSTICK
BILIRUBIN UA: NEGATIVE
Glucose, UA: NEGATIVE
KETONES UA: NEGATIVE
Nitrite, UA: NEGATIVE
PH UA: 5
Spec Grav, UA: 1.02
Urobilinogen, UA: NEGATIVE

## 2015-03-04 LAB — POCT UA - MICROSCOPIC ONLY
CASTS, UR, LPF, POC: NEGATIVE
CRYSTALS, UR, HPF, POC: NEGATIVE
Mucus, UA: NEGATIVE
Yeast, UA: NEGATIVE

## 2015-03-04 MED ORDER — ROSUVASTATIN CALCIUM 10 MG PO TABS: 10.0000 mg | ORAL_TABLET | Freq: Every day | ORAL | Status: DC

## 2015-03-04 MED ORDER — AMLODIPINE BESYLATE 10 MG PO TABS: ORAL_TABLET | ORAL | Status: DC

## 2015-03-04 NOTE — Progress Notes (Signed)
Subjective:   Diane Carlson is a 69 y.o. female who presents for an Initial Medicare Annual Wellness Visit.  Review of Systems  Review of Systems  Constitutional: Negative for fever, chills, weight loss, malaise/fatigue and diaphoresis.  HENT: Negative for congestion, ear pain, hearing loss, nosebleeds, sore throat and tinnitus.   Eyes: Negative for blurred vision, double vision, photophobia, pain, discharge and redness.  Respiratory: Negative for cough, hemoptysis, sputum production, shortness of breath and wheezing.   Cardiovascular: Negative for chest pain, palpitations, orthopnea, leg swelling and PND.  Gastrointestinal: Positive for abdominal pain (periumbilical and below). Negative for heartburn, nausea, vomiting, diarrhea, constipation, blood in stool and melena.  Genitourinary: Negative for dysuria, urgency, frequency, hematuria and flank pain.  Musculoskeletal: Negative for myalgias, back pain, joint pain, falls and neck pain.  Skin: Negative for itching and rash.  Neurological: Negative for dizziness, tingling, tremors, sensory change, speech change, focal weakness, seizures, loss of consciousness, weakness and headaches.  Endo/Heme/Allergies: Negative for environmental allergies and polydipsia. Does not bruise/bleed easily.  Psychiatric/Behavioral: Negative for depression, suicidal ideas, hallucinations, memory loss and substance abuse. The patient is not nervous/anxious and does not have insomnia.      Current Medications (verified) Outpatient Encounter Prescriptions as of 03/04/2015  Medication Sig  . amLODipine (NORVASC) 10 MG tablet TAKE ONE TABLET BY MOUTH ONE TIME DAILY  . aspirin 81 MG tablet Take 81 mg by mouth daily.    . cholecalciferol (VITAMIN D) 1000 UNITS tablet Take 1,000 Units by mouth daily.  . fish oil-omega-3 fatty acids 1000 MG capsule Take 2 g by mouth daily.  . Garlic 123XX123 MG CAPS Take by mouth.  . rosuvastatin (CRESTOR) 10 MG tablet Take 1 tablet (10  mg total) by mouth daily.  . [DISCONTINUED] amLODipine (NORVASC) 10 MG tablet TAKE ONE TABLET BY MOUTH ONE TIME DAILY  . [DISCONTINUED] rosuvastatin (CRESTOR) 10 MG tablet Take 1 tablet (10 mg total) by mouth daily.   No facility-administered encounter medications on file as of 03/04/2015.    Allergies (verified) Cephalexin; Pravastatin; Sulfa antibiotics; and Zetia   History: Past Medical History  Diagnosis Date  . Hx of appendectomy   . FH: TAH-BSO (total abdominal hysterectomy and bilateral salpingo-oophorectomy)   . Paget's disease   . Osteopenia   . Essential hypertension, benign   . Hyperlipidemia   . GERD (gastroesophageal reflux disease)   . Kidney disease   . Gout   . Cholesteatoma   . Allergy   . Vitamin D deficiency    Past Surgical History  Procedure Laterality Date  . Appendectomy    . Carotid endoectomy Left   . Abdominal hysterectomy    . Cataract Bilateral 02/27/14   Family History  Problem Relation Age of Onset  . Heart disease Mother   . Heart disease Father    Social History   Occupational History  . Not on file.   Social History Main Topics  . Smoking status: Former Smoker -- 1.00 packs/day    Types: Cigarettes    Quit date: 08/25/2009  . Smokeless tobacco: Not on file  . Alcohol Use: No  . Drug Use: No  . Sexual Activity: Not on file    Do you feel safe at home?  Yes  Dietary issues and exercise activities:    Current Dietary habits:  Follows a careful low-fat, calorie controlled diet. Appetite is good   Objective:    Today's Vitals   03/04/15 1023  BP: 134/71  Pulse: 96  Temp: 96.9 F (36.1 C)  TempSrc: Oral  Height: 5\' 3"  (1.6 m)  Weight: 110 lb 3.2 oz (49.986 kg)  SpO2: 98%   Body mass index is 19.53 kg/(m^2).  Activities of Daily Living In your present state of health, do you have any difficulty performing the following activities: 03/04/2015  Hearing? N  Vision? N  Difficulty concentrating or making decisions? Y    Walking or climbing stairs? N  Dressing or bathing? N  Doing errands, shopping? N    Are there smokers in your home (other than you)? No      Depression Screen PHQ 2/9 Scores 03/04/2015 08/31/2014 04/27/2014 12/21/2013  PHQ - 2 Score 0 0 0 0    Fall Risk Fall Risk  03/04/2015 08/31/2014 04/27/2014 12/21/2013 09/18/2013  Falls in the past year? No No No No No    Cognitive Function: No flowsheet data found.  Immunizations and Health Maintenance Immunization History  Administered Date(s) Administered  . Influenza Whole 12/28/2009  . Influenza,inj,Quad PF,36+ Mos 01/15/2014, 01/11/2015  . Pneumococcal Conjugate-13 08/31/2014  . Pneumococcal Polysaccharide-23 08/25/2012  . Tdap 09/04/2010  . Zoster 03/10/2013   There are no preventive care reminders to display for this patient.  Patient Care Team: Claretta Fraise, MD as PCP - General (Family Medicine)  Indicate any recent Medical Services you may have received from other than [facility] providers in the past year (date may be approximate).    Assessment:    Annual Wellness Visit    Screening Tests Health Maintenance  Topic Date Due  . Hepatitis C Screening  09/02/2015 (Originally 24-Dec-1945)  . MAMMOGRAM  10/24/2015  . INFLUENZA VACCINE  10/29/2015  . DEXA SCAN  05/09/2016  . TETANUS/TDAP  09/03/2020  . COLONOSCOPY  12/28/2021  . ZOSTAVAX  Completed  . PNA vac Low Risk Adult  Completed        Plan:   During the course of the visit Diane Carlson was educated and counseled about the following appropriate screening and preventive services:   Vaccines to include Pneumoccal, Influenza, Hepatitis B, Td, Zostavax,  Colorectal cancer screening  Cardiovascular disease screening  Diabetes screening  Bone Denisty / Osteoporosis Screening  Mammogram  PAP  Glaucoma screening / Diabetic Eye Exam  Nutrition counseling  Smoking cessation counseling  Advanced Directives   Goals    . HDL > 40    . HDL > 40     Continue low-fat  diet and Crestor as well has your fish oil capsules.      Reduce fall risk by removing rugs from the home as much as possible. Wear rubber soled slippers and remove obstacles from pathways such as Warden/ranger.  Patient Instructions (the written plan) were given to the patient.   Claretta Fraise, MD   03/04/2015

## 2015-03-04 NOTE — Addendum Note (Signed)
Addended by: Earlene Plater on: 03/04/2015 04:58 PM   Modules accepted: Orders, SmartSet

## 2015-03-05 ENCOUNTER — Other Ambulatory Visit: Payer: Commercial Managed Care - HMO

## 2015-03-05 DIAGNOSIS — Z1212 Encounter for screening for malignant neoplasm of rectum: Secondary | ICD-10-CM

## 2015-03-05 LAB — HEPATITIS C ANTIBODY

## 2015-03-05 NOTE — Progress Notes (Signed)
Lab only 

## 2015-03-08 LAB — FECAL OCCULT BLOOD, IMMUNOCHEMICAL: FECAL OCCULT BLD: NEGATIVE

## 2015-04-09 ENCOUNTER — Telehealth: Payer: Self-pay | Admitting: Family Medicine

## 2015-04-09 NOTE — Telephone Encounter (Signed)
Spoke with patient, we have changed her pharmacy from [place] to [facility]

## 2015-04-12 DIAGNOSIS — Z961 Presence of intraocular lens: Secondary | ICD-10-CM | POA: Diagnosis not present

## 2015-04-12 DIAGNOSIS — H31091 Other chorioretinal scars, right eye: Secondary | ICD-10-CM | POA: Diagnosis not present

## 2015-04-12 DIAGNOSIS — H35411 Lattice degeneration of retina, right eye: Secondary | ICD-10-CM | POA: Diagnosis not present

## 2015-04-12 DIAGNOSIS — H10413 Chronic giant papillary conjunctivitis, bilateral: Secondary | ICD-10-CM | POA: Diagnosis not present

## 2015-04-17 ENCOUNTER — Other Ambulatory Visit: Payer: Self-pay

## 2015-04-17 DIAGNOSIS — I10 Essential (primary) hypertension: Secondary | ICD-10-CM

## 2015-04-17 DIAGNOSIS — E785 Hyperlipidemia, unspecified: Secondary | ICD-10-CM

## 2015-04-17 MED ORDER — ROSUVASTATIN CALCIUM 10 MG PO TABS: 10.0000 mg | ORAL_TABLET | Freq: Every day | ORAL | Status: DC

## 2015-04-17 MED ORDER — AMLODIPINE BESYLATE 10 MG PO TABS: ORAL_TABLET | ORAL | Status: DC

## 2015-04-24 ENCOUNTER — Other Ambulatory Visit: Payer: Commercial Managed Care - HMO

## 2015-04-24 ENCOUNTER — Telehealth: Payer: Self-pay | Admitting: Family Medicine

## 2015-04-24 DIAGNOSIS — N183 Chronic kidney disease, stage 3 unspecified: Secondary | ICD-10-CM

## 2015-04-24 DIAGNOSIS — L989 Disorder of the skin and subcutaneous tissue, unspecified: Secondary | ICD-10-CM

## 2015-04-24 NOTE — Progress Notes (Signed)
Lab only 

## 2015-04-24 NOTE — Progress Notes (Signed)
Labs for dr. Joelyn Oms cbc renal panel intact pth dx. N18.3

## 2015-04-25 LAB — CBC WITH DIFFERENTIAL/PLATELET
BASOS ABS: 0.1 10*3/uL (ref 0.0–0.2)
Basos: 1 %
EOS (ABSOLUTE): 0.5 10*3/uL — ABNORMAL HIGH (ref 0.0–0.4)
EOS: 8 %
HEMATOCRIT: 34.3 % (ref 34.0–46.6)
HEMOGLOBIN: 11.4 g/dL (ref 11.1–15.9)
IMMATURE GRANS (ABS): 0 10*3/uL (ref 0.0–0.1)
Immature Granulocytes: 0 %
LYMPHS ABS: 1.8 10*3/uL (ref 0.7–3.1)
LYMPHS: 26 %
MCH: 29.6 pg (ref 26.6–33.0)
MCHC: 33.2 g/dL (ref 31.5–35.7)
MCV: 89 fL (ref 79–97)
MONOCYTES: 10 %
Monocytes Absolute: 0.7 10*3/uL (ref 0.1–0.9)
NEUTROS ABS: 3.8 10*3/uL (ref 1.4–7.0)
Neutrophils: 55 %
Platelets: 262 10*3/uL (ref 150–379)
RBC: 3.85 x10E6/uL (ref 3.77–5.28)
RDW: 13.4 % (ref 12.3–15.4)
WBC: 6.8 10*3/uL (ref 3.4–10.8)

## 2015-04-25 LAB — RENAL FUNCTION PANEL
ALBUMIN: 4.4 g/dL (ref 3.6–4.8)
BUN/Creatinine Ratio: 12 (ref 11–26)
BUN: 20 mg/dL (ref 8–27)
CHLORIDE: 101 mmol/L (ref 96–106)
CO2: 22 mmol/L (ref 18–29)
Calcium: 9 mg/dL (ref 8.7–10.3)
Creatinine, Ser: 1.66 mg/dL — ABNORMAL HIGH (ref 0.57–1.00)
GFR, EST AFRICAN AMERICAN: 36 mL/min/{1.73_m2} — AB (ref >59)
GFR, EST NON AFRICAN AMERICAN: 31 mL/min/{1.73_m2} — AB (ref >59)
GLUCOSE: 99 mg/dL (ref 65–99)
PHOSPHORUS: 4.7 mg/dL — AB (ref 2.5–4.5)
POTASSIUM: 4 mmol/L (ref 3.5–5.2)
Sodium: 142 mmol/L (ref 134–144)

## 2015-04-25 LAB — PARATHYROID HORMONE, INTACT (NO CA): PTH: 119 pg/mL — ABNORMAL HIGH (ref 15–65)

## 2015-04-25 NOTE — Telephone Encounter (Signed)
Dermatologist in [place]. That take [employer] are Tafe[facility] and Allyson Sabal but she will need a referral for this

## 2015-05-01 ENCOUNTER — Ambulatory Visit (INDEPENDENT_AMBULATORY_CARE_PROVIDER_SITE_OTHER): Payer: Commercial Managed Care - HMO | Admitting: Family Medicine

## 2015-05-01 ENCOUNTER — Encounter: Payer: Self-pay | Admitting: Family Medicine

## 2015-05-01 VITALS — BP 138/77 | HR 96 | Temp 97.0°F | Ht 63.0 in | Wt 110.2 lb

## 2015-05-01 DIAGNOSIS — J0111 Acute recurrent frontal sinusitis: Secondary | ICD-10-CM | POA: Diagnosis not present

## 2015-05-01 MED ORDER — FLUTICASONE PROPIONATE 50 MCG/ACT NA SUSP: 2.0000 | Freq: Every day | NASAL | Status: DC

## 2015-05-01 MED ORDER — AMOXICILLIN 500 MG PO CAPS: 500.0000 mg | ORAL_CAPSULE | Freq: Three times a day (TID) | ORAL | Status: DC

## 2015-05-01 NOTE — Progress Notes (Signed)
   HPI  Patient presents today with concern for sinus infection.  Patient explains that over the last 4 days she's had nasal congestion, frontal sinus pain and pressure, and intermittent headache. She also notes malaise, subjective fever, and feeling unwell in general.  She is breathing easily, no chest pain, no problems tolerating fluids or fluids.  She had mini similar episodes of sinusitis and has always done well with amoxicillin. She cannot what her allergy to Keflex is.  PMH: Smoking status noted ROS: Per HPI  Objective: BP 138/77 mmHg  Pulse 96  Temp(Src) 97 F (36.1 C) (Oral)  Ht 5\' 3"  (1.6 m)  Wt 110 lb 3.2 oz (49.986 kg)  BMI 19.53 kg/m2 Gen: NAD, alert, cooperative with exam HEENT: NCAT, frontal sinus tenderness to palpation, Sinuses okay, oropharynx clear, nasal mucosa with swelling of the turbinates bilaterally CV: RRR, good S1/S2, no murmur Resp: CTABL, no wheezes, non-labored Ext: No edema, warm Neuro: Alert and oriented, No gross deficits  Assessment and plan:  # Acute frontal sinusitis Treating with amoxicillin, I usually use Augmentin or Omnicef, however considering the patient's age and that she has resolved with this several courses in the past I will go ahead and use amoxicillin this time. Her last course was one year ago. Recommended Flonase Discussed the possibility of resolution without antibiotics, however given her severe symptoms I understand her desire to start today. Return to clinic for worsening symptoms or failure to improve as expected   Meds ordered this encounter  Medications  . amoxicillin (AMOXIL) 500 MG capsule    Sig: Take 1 capsule (500 mg total) by mouth 3 (three) times daily.    Dispense:  30 capsule    Refill:  0  . fluticasone (FLONASE) 50 MCG/ACT nasal spray    Sig: Place 2 sprays into both nostrils daily.    Dispense:  16 g    Refill:  [place], MD [facility] 05/01/2015, 9:46  AM

## 2015-05-01 NOTE — Patient Instructions (Signed)
Great to meet you!  Like I said, if you can take flonase twice daily for 5 days I think it will really help.   You can wait 3 days to start antibiotics if you try this but given your severe symptoms I understand if you start right away.   Sinusitis, Adult Sinusitis is redness, soreness, and inflammation of the paranasal sinuses. Paranasal sinuses are air pockets within the bones of your face. They are located beneath your eyes, in the middle of your forehead, and above your eyes. In healthy paranasal sinuses, mucus is able to drain out, and air is able to circulate through them by way of your nose. However, when your paranasal sinuses are inflamed, mucus and air can become trapped. This can allow bacteria and other germs to grow and cause infection. Sinusitis can develop quickly and last only a short time (acute) or continue over a long period (chronic). Sinusitis that lasts for more than 12 weeks is considered chronic. CAUSES Causes of sinusitis include:  Allergies.  Structural abnormalities, such as displacement of the cartilage that separates your nostrils (deviated septum), which can decrease the air flow through your nose and sinuses and affect sinus drainage.  Functional abnormalities, such as when the small hairs (cilia) that line your sinuses and help remove mucus do not work properly or are not present. SIGNS AND SYMPTOMS Symptoms of acute and chronic sinusitis are the same. The primary symptoms are pain and pressure around the affected sinuses. Other symptoms include:  Upper toothache.  Earache.  Headache.  Bad breath.  Decreased sense of smell and taste.  A cough, which worsens when you are lying flat.  Fatigue.  Fever.  Thick drainage from your nose, which often is green and may contain pus (purulent).  Swelling and warmth over the affected sinuses. DIAGNOSIS Your health care provider will perform a physical exam. During your exam, your health care provider may  perform any of the following to help determine if you have acute sinusitis or chronic sinusitis:  Look in your nose for signs of abnormal growths in your nostrils (nasal polyps).  Tap over the affected sinus to check for signs of infection.  View the inside of your sinuses using an imaging device that has a light attached (endoscope). If your health care provider suspects that you have chronic sinusitis, one or more of the following tests may be recommended:  Allergy tests.  Nasal culture. A sample of mucus is taken from your nose, sent to a lab, and screened for bacteria.  Nasal cytology. A sample of mucus is taken from your nose and examined by your health care provider to determine if your sinusitis is related to an allergy. TREATMENT Most cases of acute sinusitis are related to a viral infection and will resolve on their own within 10 days. Sometimes, medicines are prescribed to help relieve symptoms of both acute and chronic sinusitis. These may include pain medicines, decongestants, nasal steroid sprays, or saline sprays. However, for sinusitis related to a bacterial infection, your health care provider will prescribe antibiotic medicines. These are medicines that will help kill the bacteria causing the infection. Rarely, sinusitis is caused by a fungal infection. In these cases, your health care provider will prescribe antifungal medicine. For some cases of chronic sinusitis, surgery is needed. Generally, these are cases in which sinusitis recurs more than 3 times per year, despite other treatments. HOME CARE INSTRUCTIONS  Drink plenty of water. Water helps thin the mucus so your sinuses can  drain more easily.  Use a humidifier.  Inhale steam 3-4 times a day (for example, sit in the bathroom with the shower running).  Apply a warm, moist washcloth to your face 3-4 times a day, or as directed by your health care provider.  Use saline nasal sprays to help moisten and clean your  sinuses.  Take medicines only as directed by your health care provider.  If you were prescribed either an antibiotic or antifungal medicine, finish it all even if you start to feel better. SEEK IMMEDIATE MEDICAL CARE IF:  You have increasing pain or severe headaches.  You have nausea, vomiting, or drowsiness.  You have swelling around your face.  You have vision problems.  You have a stiff neck.  You have difficulty breathing.   This information is not intended to replace advice given to you by your health care provider. Make sure you discuss any questions you have with your health care provider.   Document Released: 03/16/2005 Document Revised: 04/06/2014 Document Reviewed: 03/31/2011 Elsevier Interactive Patient Education [employer].

## 2015-05-02 DIAGNOSIS — N2581 Secondary hyperparathyroidism of renal origin: Secondary | ICD-10-CM | POA: Diagnosis not present

## 2015-05-02 DIAGNOSIS — N184 Chronic kidney disease, stage 4 (severe): Secondary | ICD-10-CM | POA: Diagnosis not present

## 2015-05-02 DIAGNOSIS — E875 Hyperkalemia: Secondary | ICD-10-CM | POA: Diagnosis not present

## 2015-05-02 DIAGNOSIS — I10 Essential (primary) hypertension: Secondary | ICD-10-CM | POA: Diagnosis not present

## 2015-05-14 ENCOUNTER — Telehealth: Payer: Self-pay | Admitting: Family Medicine

## 2015-05-14 NOTE — Telephone Encounter (Signed)
Stp and advised that although she is finished with the antibiotic it is still in her system for about a week so she doesn't need a refill. Pt voiced understanding.

## 2015-05-16 ENCOUNTER — Encounter: Payer: Self-pay | Admitting: Family Medicine

## 2015-05-16 ENCOUNTER — Ambulatory Visit (INDEPENDENT_AMBULATORY_CARE_PROVIDER_SITE_OTHER): Payer: Commercial Managed Care - HMO | Admitting: Family Medicine

## 2015-05-16 VITALS — BP 140/75 | HR 103 | Temp 97.1°F | Ht 63.0 in | Wt 109.2 lb

## 2015-05-16 DIAGNOSIS — J0100 Acute maxillary sinusitis, unspecified: Secondary | ICD-10-CM | POA: Diagnosis not present

## 2015-05-16 MED ORDER — AMOXICILLIN 500 MG PO CAPS: 500.0000 mg | ORAL_CAPSULE | Freq: Three times a day (TID) | ORAL | Status: DC

## 2015-05-16 NOTE — Addendum Note (Signed)
Addended by: Wardell Honour on: 05/16/2015 11:31 AM   Modules accepted: Orders

## 2015-05-16 NOTE — Progress Notes (Signed)
   Subjective:    Patient ID: Diane Carlson, female    DOB: February 20, 1946, 70 y.o.   MRN: [id number]   HPI 70 year old female here to follow-up congestion. She has been treated with amoxicillin for presumed sinus infection but symptoms persist. Congestion is more in the head and face rather than in her chest. She has improved somewhat with the amoxicillin but does not feel infection is totally treated.  Patient Active Problem List   Diagnosis Date Noted  . Vitamin D deficiency   . Seasonal allergic rhinitis 11/29/2012  . Anemia of chronic disease 08/25/2012  . Hyperkalemia 08/25/2012  . FH: TAH-BSO (total abdominal hysterectomy and bilateral salpingo-oophorectomy)   . Osteitis deformans   . Osteopenia   . Essential hypertension, benign   . Hyperlipidemia   . Kidney disease   . Gout   . Cholesteatoma    Outpatient Encounter Prescriptions as of 05/16/2015  Medication Sig  . amLODipine (NORVASC) 10 MG tablet TAKE ONE TABLET BY MOUTH ONE TIME DAILY  . aspirin 81 MG tablet Take 81 mg by mouth daily.    . calcitRIOL (ROCALTROL) 0.25 MCG capsule Take 0.25 mcg by mouth daily.  . cholecalciferol (VITAMIN D) 1000 UNITS tablet Take 1,000 Units by mouth daily.  . fish oil-omega-3 fatty acids 1000 MG capsule Take 2 g by mouth daily.  . fluticasone (FLONASE) 50 MCG/ACT nasal spray Place 2 sprays into both nostrils daily.  . Garlic 123XX123 MG CAPS Take by mouth.  . rosuvastatin (CRESTOR) 10 MG tablet Take 1 tablet (10 mg total) by mouth daily.  . [DISCONTINUED] amoxicillin (AMOXIL) 500 MG capsule Take 1 capsule (500 mg total) by mouth 3 (three) times daily.   No facility-administered encounter medications on file as of 05/16/2015.      Review of Systems  Constitutional: Negative.   HENT: Positive for congestion.   Respiratory: Positive for cough.   Cardiovascular: Negative.   Neurological: Negative.   Psychiatric/Behavioral: Negative.        Objective:   Physical Exam  Constitutional: She  appears well-developed and well-nourished.  HENT:  Right Ear: External ear normal.  Left Ear: External ear normal.  Mouth/Throat: Oropharynx is clear and moist.  Maxillary sinuses are tender to percussion  Cardiovascular: Normal rate and regular rhythm.   Pulmonary/Chest: Effort normal and breath sounds normal.          Assessment & Plan:  1. Acute maxillary sinusitis, recurrence not specified Persistent symptoms. I believe there are some folks in the day 20 day course to effectively treat so I will renew the amoxicillin 500 3 times a day.  Wardell Honour MD

## 2015-05-17 ENCOUNTER — Telehealth: Payer: Self-pay | Admitting: Family Medicine

## 2015-05-17 NOTE — Telephone Encounter (Signed)
Pt aware Dr. Durene Cal office is closed today, but we will change her appointment from Dr. Allyson Sabal to Dr. Nevada Crane

## 2015-05-21 ENCOUNTER — Telehealth: Payer: Self-pay | Admitting: Family Medicine

## 2015-05-21 ENCOUNTER — Other Ambulatory Visit (INDEPENDENT_AMBULATORY_CARE_PROVIDER_SITE_OTHER): Payer: Commercial Managed Care - HMO

## 2015-05-21 DIAGNOSIS — N184 Chronic kidney disease, stage 4 (severe): Secondary | ICD-10-CM

## 2015-05-21 MED ORDER — DESLORATADINE 5 MG PO TABS: 5.0000 mg | ORAL_TABLET | Freq: Every day | ORAL | Status: DC

## 2015-05-21 NOTE — Telephone Encounter (Signed)
Patient aware.

## 2015-05-21 NOTE — Addendum Note (Signed)
Addended by: Timmothy Euler on: 05/21/2015 02:41 PM   Modules accepted: Orders

## 2015-05-21 NOTE — Telephone Encounter (Signed)
Patient is still coughing, has nasal congestion, and would like for clarinex to be sent to her pharmacy.  She states that Dr. Jacelyn Grip prescribed this medication in 2015 and it seemed to help her

## 2015-05-21 NOTE — Telephone Encounter (Signed)
clarinex sent  I cannot guarantee coverage.   Laroy Apple, MD [facility] 05/21/2015, 2:41 PM

## 2015-05-21 NOTE — Progress Notes (Signed)
Labs for Dr Pearson Grippe  BMP N18.4

## 2015-05-22 LAB — BMP8+EGFR
BUN / CREAT RATIO: 14 (ref 11–26)
BUN: 22 mg/dL (ref 8–27)
CO2: 24 mmol/L (ref 18–29)
Calcium: 9.1 mg/dL (ref 8.7–10.3)
Chloride: 101 mmol/L (ref 96–106)
Creatinine, Ser: 1.61 mg/dL — ABNORMAL HIGH (ref 0.57–1.00)
GFR calc non Af Amer: 33 mL/min/{1.73_m2} — ABNORMAL LOW (ref >59)
GFR, EST AFRICAN AMERICAN: 38 mL/min/{1.73_m2} — AB (ref >59)
GLUCOSE: 87 mg/dL (ref 65–99)
POTASSIUM: 5.9 mmol/L — AB (ref 3.5–5.2)
Sodium: 140 mmol/L (ref 134–144)

## 2015-05-23 DIAGNOSIS — H61009 Unspecified perichondritis of external ear, unspecified ear: Secondary | ICD-10-CM | POA: Diagnosis not present

## 2015-05-23 DIAGNOSIS — E875 Hyperkalemia: Secondary | ICD-10-CM | POA: Diagnosis not present

## 2015-06-17 ENCOUNTER — Other Ambulatory Visit: Payer: Self-pay | Admitting: Family Medicine

## 2015-06-27 DIAGNOSIS — H61009 Unspecified perichondritis of external ear, unspecified ear: Secondary | ICD-10-CM | POA: Diagnosis not present

## 2015-07-29 ENCOUNTER — Telehealth: Payer: Self-pay

## 2015-07-29 NOTE — Telephone Encounter (Signed)
I want a referral to Dr Ubaldo Glassing  Dermatology  Ear lesion

## 2015-07-29 NOTE — Telephone Encounter (Signed)
Okay to refer to Dr. Ubaldo Glassing

## 2015-07-30 ENCOUNTER — Other Ambulatory Visit: Payer: Self-pay

## 2015-07-30 ENCOUNTER — Ambulatory Visit: Payer: Commercial Managed Care - HMO | Admitting: Family Medicine

## 2015-07-30 DIAGNOSIS — H9390 Unspecified disorder of ear, unspecified ear: Secondary | ICD-10-CM

## 2015-08-06 ENCOUNTER — Other Ambulatory Visit: Payer: Self-pay | Admitting: Family Medicine

## 2015-09-02 ENCOUNTER — Encounter: Payer: Self-pay | Admitting: Family Medicine

## 2015-09-02 ENCOUNTER — Ambulatory Visit (INDEPENDENT_AMBULATORY_CARE_PROVIDER_SITE_OTHER): Payer: Commercial Managed Care - HMO | Admitting: Family Medicine

## 2015-09-02 VITALS — BP 129/73 | HR 75 | Temp 96.7°F | Ht 63.0 in | Wt 109.0 lb

## 2015-09-02 DIAGNOSIS — J302 Other seasonal allergic rhinitis: Secondary | ICD-10-CM | POA: Diagnosis not present

## 2015-09-02 DIAGNOSIS — D638 Anemia in other chronic diseases classified elsewhere: Secondary | ICD-10-CM

## 2015-09-02 DIAGNOSIS — S40862A Insect bite (nonvenomous) of left upper arm, initial encounter: Secondary | ICD-10-CM | POA: Diagnosis not present

## 2015-09-02 DIAGNOSIS — E785 Hyperlipidemia, unspecified: Secondary | ICD-10-CM | POA: Diagnosis not present

## 2015-09-02 DIAGNOSIS — I10 Essential (primary) hypertension: Secondary | ICD-10-CM | POA: Diagnosis not present

## 2015-09-02 DIAGNOSIS — W57XXXA Bitten or stung by nonvenomous insect and other nonvenomous arthropods, initial encounter: Secondary | ICD-10-CM | POA: Diagnosis not present

## 2015-09-02 DIAGNOSIS — T148 Other injury of unspecified body region: Secondary | ICD-10-CM | POA: Diagnosis not present

## 2015-09-02 MED ORDER — DOXYCYCLINE HYCLATE 100 MG PO CAPS: 100.0000 mg | ORAL_CAPSULE | Freq: Two times a day (BID) | ORAL | Status: DC

## 2015-09-02 MED ORDER — AMLODIPINE BESYLATE 10 MG PO TABS: 10.0000 mg | ORAL_TABLET | Freq: Every day | ORAL | Status: DC

## 2015-09-02 MED ORDER — ROSUVASTATIN CALCIUM 10 MG PO TABS: 10.0000 mg | ORAL_TABLET | Freq: Every day | ORAL | Status: DC

## 2015-09-02 NOTE — Addendum Note (Signed)
Addended by: Marin Olp on: 09/02/2015 08:41 AM   Modules accepted: Orders, Medications

## 2015-09-02 NOTE — Progress Notes (Addendum)
Subjective:  Patient ID: Diane Carlson, female    DOB: 1945/06/06  Age: 70 y.o. MRN: [id number]  CC: No chief complaint on file.   HPI Diane Carlson presents for  follow-up of hypertension. Patient has no history of headache chest pain or shortness of breath or recent cough. Patient also denies symptoms of TIA such as numbness weakness lateralizing. Patient checks  blood pressure at home and has not had any elevated readings recently. Patient denies side effects from his medication. States taking it regularly.  Patient also  in for follow-up of elevated cholesterol. Doing well without complaints on current medication. Denies side effects of statin including myalgia and arthralgia and nausea. Also in today for liver function testing. Currently no chest pain, shortness of breath or other cardiovascular related symptoms noted.  Bit by a tiny tick. Found it yesterdayon dorsal aspect of left upper arm.     History Diane Carlson has a past medical history of appendectomy; FH: TAH-BSO (total abdominal hysterectomy and bilateral salpingo-oophorectomy); Paget's disease; Osteopenia; Essential hypertension, benign; Hyperlipidemia; GERD (gastroesophageal reflux disease); Kidney disease; Gout; Cholesteatoma; Allergy; and Vitamin D deficiency.   She has past surgical history that includes Appendectomy; carotid endoectomy (Left); Abdominal hysterectomy; and cataract (Bilateral, 02/27/14).   Her family history includes Heart disease in her father and mother.She reports that she quit smoking about 6 years ago. Her smoking use included Cigarettes. She smoked 1.00 pack per day. She does not have any smokeless tobacco history on file. She reports that she does not drink alcohol or use illicit drugs.  Current Outpatient Prescriptions on File Prior to Visit  Medication Sig Dispense Refill  . aspirin 81 MG tablet Take 81 mg by mouth daily.      . calcitRIOL (ROCALTROL) 0.25 MCG capsule Take 0.25 mcg by mouth daily.    .  cholecalciferol (VITAMIN D) 1000 UNITS tablet Take 1,000 Units by mouth daily.    [name] desloratadine (CLARINEX) 5 MG tablet Take 1 tablet (5 mg total) by mouth daily. 30 tablet 11  . fish oil-omega-3 fatty acids 1000 MG capsule Take 2 g by mouth daily.    . fluticasone (FLONASE) 50 MCG/ACT nasal spray Place 2 sprays into both nostrils daily. 16 g 6  . Garlic 8264 MG CAPS Take by mouth.     No current facility-administered medications on file prior to visit.    ROS Review of Systems  Constitutional: Negative for fever, activity change and appetite change.  HENT: Negative for congestion, rhinorrhea and sore throat.   Eyes: Negative for visual disturbance.  Respiratory: Negative for cough and shortness of breath.   Cardiovascular: Negative for chest pain and palpitations.  Gastrointestinal: Negative for nausea, abdominal pain and diarrhea.  Genitourinary: Negative for dysuria.  Musculoskeletal: Negative for myalgias and arthralgias.    Objective:  BP 129/73 mmHg  Pulse 75  Temp(Src) 96.7 F (35.9 C) (Oral)  Ht 5' 3"  (1.6 m)  Wt 109 lb (49.442 kg)  BMI 19.31 kg/m2  SpO2 99%  BP Readings from Last 3 Encounters:  09/02/15 129/73  05/16/15 140/75  05/01/15 138/77    Wt Readings from Last 3 Encounters:  09/02/15 109 lb (49.442 kg)  05/16/15 109 lb 3.2 oz (49.533 kg)  05/01/15 110 lb 3.2 oz (49.986 kg)     Physical Exam  Constitutional: She is oriented to person, place, and time. She appears well-developed and well-nourished. No distress.  HENT:  Head: Normocephalic and atraumatic.  Right Ear: External ear normal.  Left Ear: External ear normal.  Nose: Nose normal.  Mouth/Throat: Oropharynx is clear and moist.  Eyes: Conjunctivae and EOM are normal. Pupils are equal, round, and reactive to light.  Neck: Normal range of motion. Neck supple. No thyromegaly present.  Cardiovascular: Normal rate, regular rhythm and normal heart sounds.   No murmur heard. Pulmonary/Chest:  Effort normal and breath sounds normal. No respiratory distress. She has no wheezes. She has no rales.  Abdominal: Soft. Bowel sounds are normal. She exhibits no distension. There is no tenderness.  Lymphadenopathy:    She has no cervical adenopathy.  Neurological: She is alert and oriented to person, place, and time. She has normal reflexes.  Skin: Skin is warm and dry.  Psychiatric: She has a normal mood and affect. Her behavior is normal. Judgment and thought content normal.    No results found for: HGBA1C  Lab Results  Component Value Date   WBC 6.8 04/24/2015   HGB 12.8 08/31/2014   HCT 34.3 04/24/2015   PLT 262 04/24/2015   GLUCOSE 87 05/21/2015   CHOL 130 02/26/2015   TRIG 133 02/26/2015   HDL 52 02/26/2015   LDLCALC 51 02/26/2015   ALT 13 02/26/2015   AST 22 02/26/2015   NA 140 05/21/2015   K 5.9* 05/21/2015   CL 101 05/21/2015   CREATININE 1.61* 05/21/2015   BUN 22 05/21/2015   CO2 24 05/21/2015   TSH 1.210 08/31/2014    No results found.  Assessment & Plan:   Diagnoses and all orders for this visit:  Essential hypertension, benign -     CMP14+EGFR  Hyperlipidemia -     CMP14+EGFR -     Lipid panel -     Lipid panel  Seasonal allergic rhinitis  Anemia of chronic disease -     CBC with Differential/Platelet  Tick bite of left upper arm, initial encounter -     CBC with Differential/Platelet  Other orders -     doxycycline (VIBRAMYCIN) 100 MG capsule; Take 1 capsule (100 mg total) by mouth 2 (two) times daily. -     amLODipine (NORVASC) 10 MG tablet; Take 1 tablet (10 mg total) by mouth daily. -     rosuvastatin (CRESTOR) 10 MG tablet; Take 1 tablet (10 mg total) by mouth daily.   I have discontinued Diane Carlson's amoxicillin. I have also changed her amLODipine and rosuvastatin. Additionally, I am having her start on doxycycline. Lastly, I am having her maintain her aspirin, fish oil-omega-3 fatty acids, Garlic, cholecalciferol, fluticasone,  calcitRIOL, and desloratadine.  Meds ordered this encounter  Medications  . doxycycline (VIBRAMYCIN) 100 MG capsule    Sig: Take 1 capsule (100 mg total) by mouth 2 (two) times daily.    Dispense:  10 capsule    Refill:  0  . amLODipine (NORVASC) 10 MG tablet    Sig: Take 1 tablet (10 mg total) by mouth daily.    Dispense:  90 tablet    Refill:  1  . rosuvastatin (CRESTOR) 10 MG tablet    Sig: Take 1 tablet (10 mg total) by mouth daily.    Dispense:  90 tablet    Refill:  1     Follow-up: Return in about 6 months (around 03/03/2016) for CPE, hypertension, cholesterol.  Claretta Fraise, M.D.

## 2015-09-09 ENCOUNTER — Telehealth: Payer: Self-pay | Admitting: Family Medicine

## 2015-09-09 DIAGNOSIS — H61002 Unspecified perichondritis of left external ear: Secondary | ICD-10-CM | POA: Diagnosis not present

## 2015-09-09 NOTE — Telephone Encounter (Signed)
Her labs are normal with the exception of kidney function which is mildly weakened but stable.

## 2015-09-10 ENCOUNTER — Telehealth: Payer: Self-pay | Admitting: Family Medicine

## 2015-09-10 NOTE — Telephone Encounter (Signed)
Patient aware of lab results.

## 2015-09-10 NOTE — Telephone Encounter (Signed)
Patient informed of results.  

## 2015-10-09 LAB — CMP14+EGFR
A/G RATIO: 1.6 (ref 1.2–2.2)
ALK PHOS: 192 IU/L — AB (ref 39–117)
ALT: 11 IU/L (ref 0–32)
AST: 18 IU/L (ref 0–40)
Albumin: 4.5 g/dL (ref 3.6–4.8)
BUN/Creatinine Ratio: 11 — ABNORMAL LOW (ref 12–28)
BUN: 20 mg/dL (ref 8–27)
Bilirubin Total: 0.3 mg/dL (ref 0.0–1.2)
CALCIUM: 9.8 mg/dL (ref 8.7–10.3)
CO2: 21 mmol/L (ref 18–29)
Chloride: 103 mmol/L (ref 96–106)
Creatinine, Ser: 1.85 mg/dL — ABNORMAL HIGH (ref 0.57–1.00)
GFR calc Af Amer: 32 mL/min/{1.73_m2} — ABNORMAL LOW (ref >59)
GFR, EST NON AFRICAN AMERICAN: 28 mL/min/{1.73_m2} — AB (ref >59)
GLOBULIN, TOTAL: 2.9 g/dL (ref 1.5–4.5)
Glucose: 92 mg/dL (ref 65–99)
POTASSIUM: 4.6 mmol/L (ref 3.5–5.2)
SODIUM: 142 mmol/L (ref 134–144)
Total Protein: 7.4 g/dL (ref 6.0–8.5)

## 2015-10-09 LAB — CBC WITH DIFFERENTIAL/PLATELET
BASOS: 1 %
Basophils Absolute: 0.1 10*3/uL (ref 0.0–0.2)
EOS (ABSOLUTE): 0.4 10*3/uL (ref 0.0–0.4)
EOS: 6 %
HEMATOCRIT: 36.5 % (ref 34.0–46.6)
HEMOGLOBIN: 12 g/dL (ref 11.1–15.9)
Immature Grans (Abs): 0 10*3/uL (ref 0.0–0.1)
Immature Granulocytes: 0 %
Lymphocytes Absolute: 1.7 10*3/uL (ref 0.7–3.1)
Lymphs: 23 %
MCH: 29.6 pg (ref 26.6–33.0)
MCHC: 32.9 g/dL (ref 31.5–35.7)
MCV: 90 fL (ref 79–97)
MONOS ABS: 0.5 10*3/uL (ref 0.1–0.9)
Monocytes: 7 %
NEUTROS ABS: 4.8 10*3/uL (ref 1.4–7.0)
NEUTROS PCT: 63 %
Platelets: 294 10*3/uL (ref 150–379)
RBC: 4.05 x10E6/uL (ref 3.77–5.28)
RDW: 14.2 % (ref 12.3–15.4)
WBC: 7.6 10*3/uL (ref 3.4–10.8)

## 2015-10-09 LAB — LIPID PANEL
CHOL/HDL RATIO: 2.5 ratio (ref 0.0–4.4)
CHOLESTEROL TOTAL: 139 mg/dL (ref 100–199)
HDL: 56 mg/dL (ref >39)
LDL Calculated: 55 mg/dL (ref 0–99)
TRIGLYCERIDES: 140 mg/dL (ref 0–149)
VLDL Cholesterol Cal: 28 mg/dL (ref 5–40)

## 2015-10-31 DIAGNOSIS — N184 Chronic kidney disease, stage 4 (severe): Secondary | ICD-10-CM | POA: Diagnosis not present

## 2015-10-31 DIAGNOSIS — N2581 Secondary hyperparathyroidism of renal origin: Secondary | ICD-10-CM | POA: Diagnosis not present

## 2015-11-15 DIAGNOSIS — N184 Chronic kidney disease, stage 4 (severe): Secondary | ICD-10-CM | POA: Diagnosis not present

## 2015-11-15 DIAGNOSIS — N2581 Secondary hyperparathyroidism of renal origin: Secondary | ICD-10-CM | POA: Diagnosis not present

## 2015-11-15 DIAGNOSIS — E875 Hyperkalemia: Secondary | ICD-10-CM | POA: Diagnosis not present

## 2015-11-15 DIAGNOSIS — I10 Essential (primary) hypertension: Secondary | ICD-10-CM | POA: Diagnosis not present

## 2015-11-15 DIAGNOSIS — D2239 Melanocytic nevi of other parts of face: Secondary | ICD-10-CM | POA: Diagnosis not present

## 2015-11-15 DIAGNOSIS — D631 Anemia in chronic kidney disease: Secondary | ICD-10-CM | POA: Diagnosis not present

## 2015-11-15 DIAGNOSIS — D225 Melanocytic nevi of trunk: Secondary | ICD-10-CM | POA: Diagnosis not present

## 2015-12-04 ENCOUNTER — Other Ambulatory Visit: Payer: Self-pay | Admitting: Family Medicine

## 2015-12-23 ENCOUNTER — Encounter: Payer: Commercial Managed Care - HMO | Admitting: *Deleted

## 2015-12-23 DIAGNOSIS — Z1231 Encounter for screening mammogram for malignant neoplasm of breast: Secondary | ICD-10-CM | POA: Diagnosis not present

## 2016-01-07 ENCOUNTER — Ambulatory Visit (INDEPENDENT_AMBULATORY_CARE_PROVIDER_SITE_OTHER): Payer: Commercial Managed Care - HMO

## 2016-01-07 DIAGNOSIS — Z23 Encounter for immunization: Secondary | ICD-10-CM

## 2016-01-20 ENCOUNTER — Telehealth: Payer: Self-pay | Admitting: Family Medicine

## 2016-01-20 ENCOUNTER — Ambulatory Visit (INDEPENDENT_AMBULATORY_CARE_PROVIDER_SITE_OTHER): Payer: Commercial Managed Care - HMO

## 2016-01-20 ENCOUNTER — Ambulatory Visit (HOSPITAL_COMMUNITY)
Admission: RE | Admit: 2016-01-20 | Discharge: 2016-01-20 | Disposition: A | Discharge status: Home / Self Care | Payer: Commercial Managed Care - HMO | Source: Ambulatory Visit | Attending: Family Medicine | Admitting: Family Medicine

## 2016-01-20 ENCOUNTER — Encounter: Payer: Self-pay | Admitting: Family Medicine

## 2016-01-20 ENCOUNTER — Ambulatory Visit (INDEPENDENT_AMBULATORY_CARE_PROVIDER_SITE_OTHER): Payer: Commercial Managed Care - HMO | Admitting: Family Medicine

## 2016-01-20 VITALS — BP 118/65 | HR 79 | Temp 98.1°F | Ht 63.0 in | Wt 109.2 lb

## 2016-01-20 DIAGNOSIS — I700 Atherosclerosis of aorta: Secondary | ICD-10-CM | POA: Diagnosis not present

## 2016-01-20 DIAGNOSIS — N23 Unspecified renal colic: Secondary | ICD-10-CM

## 2016-01-20 DIAGNOSIS — R3129 Other microscopic hematuria: Secondary | ICD-10-CM | POA: Diagnosis not present

## 2016-01-20 DIAGNOSIS — R399 Unspecified symptoms and signs involving the genitourinary system: Secondary | ICD-10-CM | POA: Diagnosis not present

## 2016-01-20 DIAGNOSIS — K5730 Diverticulosis of large intestine without perforation or abscess without bleeding: Secondary | ICD-10-CM | POA: Diagnosis not present

## 2016-01-20 LAB — URINALYSIS, COMPLETE
Bilirubin, UA: NEGATIVE
GLUCOSE, UA: NEGATIVE
KETONES UA: NEGATIVE
Leukocytes, UA: NEGATIVE
NITRITE UA: NEGATIVE
SPEC GRAV UA: 1.015 (ref 1.005–1.030)
UUROB: 0.2 mg/dL (ref 0.2–1.0)
pH, UA: 6.5 (ref 5.0–7.5)

## 2016-01-20 LAB — MICROSCOPIC EXAMINATION

## 2016-01-20 NOTE — Progress Notes (Signed)
Subjective:  Patient ID: Diane Carlson, female    DOB: 04-20-1945  Age: 70 y.o. MRN: [id number]  CC: Flank Pain (pt here today c/o flank pain for the past 2 days. Had the same pain about 2 weeks ago that stopped abruptly and then started again 2 days ago. She is worried about kidney stone/UTI. Urine collected.)   HPI Diane Carlson for Onset 2 weeks ago of severe left flank pain. Described as a deep ache. It is made worse by twisting to the right. It occurred 2 weeks ago lasted a couple of days and moderate. 2 days ago it recurred. It is 8-10/10 waxing and waning. It is intermittent and not affected by activities other than the one twisting maneuver noted. She has not had any dysuria. She has not had any frequency or urgency. There is no cough or shortness of breath. There is no abdominal pain nausea or vomiting. No fever chills or sweats. Patient denies history of back problems or kidney stones.   History Diane Carlson has a past medical history of Allergy; Cholesteatoma; Essential hypertension, benign; FH: TAH-BSO (total abdominal hysterectomy and bilateral salpingo-oophorectomy); GERD (gastroesophageal reflux disease); Gout; appendectomy; Hyperlipidemia; Kidney disease; Osteopenia; Paget's disease; and Vitamin D deficiency.   She has a past surgical history that includes Appendectomy; carotid endoectomy (Left); Abdominal hysterectomy; and cataract (Bilateral, 02/27/14).   Her family history includes Heart disease in her father and mother.She reports that she quit smoking about 6 years ago. Her smoking use included Cigarettes. She smoked 1.00 pack per day. She has never used smokeless tobacco. She reports that she does not drink alcohol or use drugs.    ROS Review of Systems  Constitutional: Negative for activity change, appetite change and fever.  HENT: Negative for congestion, rhinorrhea and sore throat.   Eyes: Negative for visual disturbance.  Respiratory: Negative for cough and  shortness of breath.   Cardiovascular: Negative for chest pain and palpitations.  Gastrointestinal: Negative for abdominal pain, diarrhea and nausea.  Genitourinary: Positive for flank pain. Negative for dysuria.  Musculoskeletal: Positive for myalgias. Negative for arthralgias.    Objective:  BP 118/65   Pulse 79   Temp 98.1 F (36.7 C) (Oral)   Ht 5\' 3"  (1.6 m)   Wt 109 lb 4 oz (49.6 kg)   BMI 19.35 kg/m   BP Readings from Last 3 Encounters:  01/20/16 118/65  09/02/15 129/73  05/16/15 140/75    Wt Readings from Last 3 Encounters:  01/20/16 109 lb 4 oz (49.6 kg)  09/02/15 109 lb (49.4 kg)  05/16/15 109 lb 3.2 oz (49.5 kg)     Physical Exam  Constitutional: She is oriented to person, place, and time. She appears well-developed and well-nourished. No distress.  HENT:  Head: Normocephalic and atraumatic.  Eyes: Conjunctivae are normal. Pupils are equal, round, and reactive to light.  Neck: Normal range of motion. Neck supple. No thyromegaly present.  Cardiovascular: Normal rate, regular rhythm and normal heart sounds.   No murmur heard. Pulmonary/Chest: Effort normal and breath sounds normal. No respiratory distress. She has no wheezes. She has no rales.  Abdominal: Soft. Bowel sounds are normal. She exhibits no distension. There is no tenderness.  Musculoskeletal: Normal range of motion. She exhibits tenderness (at left flank at about the 10th rib, posterior axillary line).  Lymphadenopathy:    She has no cervical adenopathy.  Neurological: She is alert and oriented to person, place, and time.  Skin: Skin is warm and dry.  Psychiatric: She has a normal mood and affect. Her behavior is normal. Judgment and thought content normal.     Lab Results  Component Value Date   WBC 7.6 09/02/2015   HGB 12.8 08/31/2014   HCT 36.5 09/02/2015   PLT 294 09/02/2015   GLUCOSE 92 09/02/2015   CHOL 139 09/02/2015   TRIG 140 09/02/2015   HDL 56 09/02/2015   LDLCALC 55  09/02/2015   ALT 11 09/02/2015   AST 18 09/02/2015   NA 142 09/02/2015   K 4.6 09/02/2015   CL 103 09/02/2015   CREATININE 1.85 (H) 09/02/2015   BUN 20 09/02/2015   CO2 21 09/02/2015   TSH 1.210 08/31/2014    No results found.  Assessment & Plan:   Diane Carlson was seen today for flank pain.  Diagnoses and all orders for this visit:  Renal colic -     DG Chest 2 View; Future -     DG Abd 1 View; Future -     CT RENAL STONE STUDY; Future  UTI symptoms -     Urinalysis, Complete    Urinalysis showed 2+ blood  I have discontinued Diane Carlson's fluticasone and doxycycline. I am also having her maintain her aspirin, fish oil-omega-3 fatty acids, Garlic, cholecalciferol, calcitRIOL, desloratadine, rosuvastatin, and amLODipine.  No orders of the defined types were placed in this encounter.    Follow-up: Return in about 4 days (around 01/24/2016).  Claretta Fraise, M.D.

## 2016-01-21 ENCOUNTER — Encounter: Payer: Self-pay | Admitting: Family Medicine

## 2016-01-21 NOTE — Telephone Encounter (Signed)
Dr. Livia Snellen,  Ms. Cabal is calling to find out the results of her CT scan from yesterday.  It does not look like you have had the opportunity to review these yet but I wanted to let you know she has called.

## 2016-01-29 ENCOUNTER — Telehealth: Payer: Self-pay | Admitting: Family Medicine

## 2016-01-29 NOTE — Telephone Encounter (Signed)
Please advise on lab orders prior to visit .   Last labs done on 09-02-15 with normal lipids and CBC. CMP had abnormal results.

## 2016-01-30 ENCOUNTER — Other Ambulatory Visit: Payer: Self-pay | Admitting: Family Medicine

## 2016-01-30 DIAGNOSIS — N183 Chronic kidney disease, stage 3 unspecified: Secondary | ICD-10-CM

## 2016-01-30 DIAGNOSIS — Z961 Presence of intraocular lens: Secondary | ICD-10-CM | POA: Diagnosis not present

## 2016-01-30 DIAGNOSIS — H26493 Other secondary cataract, bilateral: Secondary | ICD-10-CM | POA: Diagnosis not present

## 2016-01-30 DIAGNOSIS — H35411 Lattice degeneration of retina, right eye: Secondary | ICD-10-CM | POA: Diagnosis not present

## 2016-01-30 DIAGNOSIS — H31091 Other chorioretinal scars, right eye: Secondary | ICD-10-CM | POA: Diagnosis not present

## 2016-01-30 NOTE — Telephone Encounter (Signed)
Lab ordered.

## 2016-01-30 NOTE — Telephone Encounter (Signed)
Patient aware.

## 2016-02-05 ENCOUNTER — Other Ambulatory Visit (INDEPENDENT_AMBULATORY_CARE_PROVIDER_SITE_OTHER): Payer: Commercial Managed Care - HMO

## 2016-02-05 DIAGNOSIS — N183 Chronic kidney disease, stage 3 unspecified: Secondary | ICD-10-CM

## 2016-02-06 DIAGNOSIS — H26491 Other secondary cataract, right eye: Secondary | ICD-10-CM | POA: Diagnosis not present

## 2016-02-06 DIAGNOSIS — Z961 Presence of intraocular lens: Secondary | ICD-10-CM | POA: Diagnosis not present

## 2016-02-06 LAB — CMP14+EGFR
A/G RATIO: 1.6 (ref 1.2–2.2)
ALT: 15 IU/L (ref 0–32)
AST: 19 IU/L (ref 0–40)
Albumin: 4.3 g/dL (ref 3.6–4.8)
Alkaline Phosphatase: 151 IU/L — ABNORMAL HIGH (ref 39–117)
BILIRUBIN TOTAL: 0.3 mg/dL (ref 0.0–1.2)
BUN/Creatinine Ratio: 11 — ABNORMAL LOW (ref 12–28)
BUN: 21 mg/dL (ref 8–27)
CHLORIDE: 105 mmol/L (ref 96–106)
CO2: 23 mmol/L (ref 18–29)
Calcium: 9.4 mg/dL (ref 8.7–10.3)
Creatinine, Ser: 1.96 mg/dL — ABNORMAL HIGH (ref 0.57–1.00)
GFR calc Af Amer: 30 mL/min/{1.73_m2} — ABNORMAL LOW (ref >59)
GFR calc non Af Amer: 26 mL/min/{1.73_m2} — ABNORMAL LOW (ref >59)
GLUCOSE: 92 mg/dL (ref 65–99)
Globulin, Total: 2.7 g/dL (ref 1.5–4.5)
POTASSIUM: 5 mmol/L (ref 3.5–5.2)
Sodium: 144 mmol/L (ref 134–144)
Total Protein: 7 g/dL (ref 6.0–8.5)

## 2016-02-07 ENCOUNTER — Other Ambulatory Visit: Payer: Self-pay | Admitting: Family Medicine

## 2016-02-10 ENCOUNTER — Encounter: Payer: Commercial Managed Care - HMO | Admitting: Family Medicine

## 2016-02-18 ENCOUNTER — Encounter: Payer: Commercial Managed Care - HMO | Admitting: Family Medicine

## 2016-02-21 ENCOUNTER — Encounter: Payer: Self-pay | Admitting: Family Medicine

## 2016-02-21 ENCOUNTER — Encounter (INDEPENDENT_AMBULATORY_CARE_PROVIDER_SITE_OTHER): Payer: Commercial Managed Care - HMO | Admitting: Family Medicine

## 2016-02-21 NOTE — Progress Notes (Signed)
Erroneous encounter

## 2016-02-28 ENCOUNTER — Other Ambulatory Visit: Payer: Commercial Managed Care - HMO

## 2016-02-28 DIAGNOSIS — E7849 Other hyperlipidemia: Secondary | ICD-10-CM

## 2016-02-28 DIAGNOSIS — E784 Other hyperlipidemia: Secondary | ICD-10-CM | POA: Diagnosis not present

## 2016-02-29 LAB — LIPID PANEL
CHOL/HDL RATIO: 2.8 ratio (ref 0.0–4.4)
Cholesterol, Total: 146 mg/dL (ref 100–199)
HDL: 53 mg/dL (ref >39)
LDL Calculated: 63 mg/dL (ref 0–99)
TRIGLYCERIDES: 150 mg/dL — AB (ref 0–149)
VLDL CHOLESTEROL CAL: 30 mg/dL (ref 5–40)

## 2016-03-02 ENCOUNTER — Encounter: Payer: Commercial Managed Care - HMO | Admitting: Family Medicine

## 2016-03-03 ENCOUNTER — Encounter: Payer: Commercial Managed Care - HMO | Admitting: Family Medicine

## 2016-03-03 NOTE — Progress Notes (Signed)
Patient aware.

## 2016-03-04 ENCOUNTER — Ambulatory Visit (INDEPENDENT_AMBULATORY_CARE_PROVIDER_SITE_OTHER): Payer: Commercial Managed Care - HMO | Admitting: Family Medicine

## 2016-03-04 ENCOUNTER — Encounter: Payer: Commercial Managed Care - HMO | Admitting: Family Medicine

## 2016-03-04 ENCOUNTER — Encounter: Payer: Self-pay | Admitting: Family Medicine

## 2016-03-04 VITALS — BP 133/72 | HR 81 | Temp 97.4°F | Ht 63.0 in | Wt 109.6 lb

## 2016-03-04 DIAGNOSIS — D638 Anemia in other chronic diseases classified elsewhere: Secondary | ICD-10-CM

## 2016-03-04 DIAGNOSIS — E559 Vitamin D deficiency, unspecified: Secondary | ICD-10-CM | POA: Diagnosis not present

## 2016-03-04 DIAGNOSIS — I10 Essential (primary) hypertension: Secondary | ICD-10-CM | POA: Diagnosis not present

## 2016-03-04 DIAGNOSIS — M889 Osteitis deformans of unspecified bone: Secondary | ICD-10-CM | POA: Diagnosis not present

## 2016-03-04 DIAGNOSIS — E782 Mixed hyperlipidemia: Secondary | ICD-10-CM

## 2016-03-04 LAB — URINALYSIS
BILIRUBIN UA: NEGATIVE
Glucose, UA: NEGATIVE
Ketones, UA: NEGATIVE
Leukocytes, UA: NEGATIVE
NITRITE UA: NEGATIVE
PH UA: 6 (ref 5.0–7.5)
SPEC GRAV UA: 1.015 (ref 1.005–1.030)
UUROB: 0.2 mg/dL (ref 0.2–1.0)

## 2016-03-04 NOTE — Patient Instructions (Signed)
Gluten-Free Diet for Celiac Disease, Adult The gluten-free diet includes all foods that do not contain gluten. Gluten is a protein that is found in wheat, rye, barley, and some other grains. Following the gluten-free diet is the only treatment for people with celiac disease. It helps to prevent damage to the intestines and improves or eliminates the symptoms of celiac disease. Following the gluten-free diet requires some planning. It can be challenging at first, but it gets easier with time and practice. There are more gluten-free options available today than ever before. If you need help finding gluten-free foods or if you have questions, talk with your diet and nutrition specialist (registered dietitian) or your health care provider. What do I need to know about a gluten-free diet?  All fruits, vegetables, and meats are safe to eat and do not contain gluten.  When grocery shopping, start by shopping in the produce, meat, and dairy sections. These sections are more likely to contain gluten-free foods. Then move to the aisles that contain packaged foods if you need to.  Read all food labels. Gluten is often added to foods. Always check the ingredient list and look for warnings, such as "may contain gluten."  Talk with your dietitian or health care provider before taking a gluten-free multivitamin or mineral supplement.  Be aware of gluten-free foods having contact with foods that contain gluten (cross-contamination). This can happen at home and with any processed foods.  Talk with your health care provider or dietitian about how to reduce the risk of cross-contamination in your home.  If you have questions about how a food is processed, ask the manufacturer. What key words help to identify gluten? Foods that list any of these key words on the label usually contain gluten:  Wheat, flour, enriched flour, bromated flour, white flour, durum flour, graham flour, phosphated flour, self-rising flour,  semolina, farina, barley (malt), rye, and oats.  Starch, dextrin, modified food starch, or cereal.  Thickening, fillers, or emulsifiers.  Malt flavoring, malt extract, or malt syrup.  Hydrolyzed vegetable protein. In the [place] packaged foods that are gluten-free are required to be labeled "GF." These foods should be easy to identify and are safe to eat. In the [place] food companies are also required to list common food allergens, including wheat, on their labels. Recommended foods Grains  Amaranth, bean flours, 100% buckwheat flour, corn, millet, nut flours or nut meals, GF oats, quinoa, rice, sorghum, teff, rice wafers, pure cornmeal tortillas, popcorn, and hot cereals made from cornmeal. Hominy, rice, wild rice. Some Asian rice noodles or bean noodles. Arrowroot starch, corn bran, corn flour, corn germ, cornmeal, corn starch, potato flour, potato starch flour, and rice bran. Plain, brown, and sweet rice flours. Rice polish, soy flour, and tapioca starch. Vegetables  All plain fresh, frozen, and canned vegetables. Fruits  All plain fresh, frozen, canned, and dried fruits, and 100% fruit juices. Meats and other protein foods  All fresh beef, pork, poultry, fish, seafood, and eggs. Fish canned in water, oil, brine, or vegetable broth. Plain nuts and seeds, peanut butter. Some lunch meat and some frankfurters. Dried beans, dried peas, and lentils. Dairy  Fresh plain, dry, evaporated, or condensed milk. Cream, butter, sour cream, whipping cream, and most yogurts. Unprocessed cheese, most processed cheeses, some cottage cheese, some cream cheeses. Beverages  Coffee, tea, most herbal teas. Carbonated beverages and some root beers. Wine, sake, and distilled spirits, such as gin, vodka, and whiskey. Most hard ciders. Fats and oils  Butter, margarine,   vegetable oil, hydrogenated butter, olive oil, shortening, lard, cream, and some mayonnaise. Some commercial salad dressings. Olives. Sweets and  desserts  Sugar, honey, some syrups, molasses, jelly, and jam. Plain hard candy, marshmallows, and gumdrops. Pure cocoa powder. Plain chocolate. Custard and some pudding mixes. Gelatin desserts, sorbets, frozen ice pops, and sherbet. Cake, cookies, and other desserts prepared with allowed flours. Some commercial ice creams. Cornstarch, tapioca, and rice puddings. Seasoning and other foods  Some canned or frozen soups. Monosodium glutamate (MSG). Cider, rice, and wine vinegar. Baking soda and baking powder. Cream of tartar. Baking and nutritional yeast. Certain soy sauces made without wheat (ask your dietitian about specific brands that are allowed). Nuts, coconut, and chocolate. Salt, pepper, herbs, spices, flavoring extracts, imitation or artificial flavorings, natural flavorings, and food colorings. Some medicines and supplements. Some lip glosses and other cosmetics. Rice syrups. The items listed may not be a complete list. Talk with your dietitian about what dietary choices are best for you. Foods to avoid Grains  Barley, bran, bulgur, couscous, cracked wheat, [place], farro, graham, malt, matzo, semolina, wheat germ, and all wheat and rye cereals including spelt and kamut. Cereals containing malt as a flavoring, such as rice cereal. Noodles, spaghetti, macaroni, most packaged rice mixes, and all mixes containing wheat, rye, barley, or triticale. Vegetables  Most creamed vegetables and most vegetables canned in sauces. Some commercially prepared vegetables and salads. Fruits  Thickened or prepared fruits and some pie fillings. Some fruit snacks and fruit roll-ups. Meats and other protein foods  Any meat or meat alternative containing wheat, rye, barley, or gluten stabilizers. These are often marinated or packaged meats and lunch meats. Bread-containing products, such as Swiss steak, croquettes, meatballs, and meatloaf. Most tuna canned in vegetable broth and turkey with hydrolyzed vegetable  protein (HVP) injected as part of the basting. Seitan. Imitation fish. Eggs in sauces made from ingredients to avoid. Dairy  Commercial chocolate milk drinks and malted milk. Some non-dairy creamers. Any cheese product containing ingredients to avoid. Beverages  Certain cereal beverages. Beer, ale, malted milk, and some root beers. Some hard ciders. Some instant flavored coffees. Some herbal teas made with barley or with barley malt added. Fats and oils  Some commercial salad dressings. Sour cream containing modified food starch. Sweets and desserts  Some toffees. Chocolate-coated nuts (may be rolled in wheat flour) and some commercial candies and candy bars. Most cakes, cookies, donuts, pastries, and other baked goods. Some commercial ice cream. Ice cream cones. Commercially prepared mixes for cakes, cookies, and other desserts. Bread pudding and other puddings thickened with flour. Products containing brown rice syrup made with barley malt enzyme. Desserts and sweets made with malt flavoring. Seasoning and other foods  Some curry powders, some dry seasoning mixes, some gravy extracts, some meat sauces, some ketchups, some prepared mustards, and horseradish. Certain soy sauces. Malt vinegar. Bouillon and bouillon cubes that contain HVP. Some chip dips, and some chewing gum. Yeast extract. Brewer's yeast. Caramel color. Some medicines and supplements. Some lip glosses and other cosmetics. The items listed may not be a complete list. Talk with your dietitian about what dietary choices are best for you. Summary  Gluten is a protein that is found in wheat, rye, barley, and some other grains. The gluten-free diet includes all foods that do not contain gluten.  If you need help finding gluten-free foods or if you have questions, talk with your diet and nutrition specialist (registered dietitian) or your health care provider.  Read all   food labels. Gluten is often added to foods. Always check the  ingredient list and look for warnings, such as "may contain gluten." This information is not intended to replace advice given to you by your health care provider. Make sure you discuss any questions you have with your health care provider. Document Released: 03/16/2005 Document Revised: 12/30/2015 Document Reviewed: 12/30/2015 Elsevier Interactive Patient Education  2017 [employer]  

## 2016-03-04 NOTE — Progress Notes (Signed)
Subjective:  Patient ID: Diane Carlson, female    DOB: 02-16-1946  Age: 70 y.o. MRN: [id number]  CC: Annual Exam   HPI Diane Carlson presents for  follow-up of hypertension. Patient has no history of headache chest pain or shortness of breath or recent cough. Patient also denies symptoms of TIA such as numbness weakness lateralizing. Patient checks  blood pressure at home and has not had any elevated readings recently. Patient denies side effects from his medication. States taking it regularly. Patient in for follow-up of elevated cholesterol. Doing well without complaints on current medication. Denies side effects of statin including myalgia and arthralgia and nausea. Also in today for liver function testing. Currently no chest pain, shortness of breath or other cardiovascular related symptoms noted.  Taking vitamin D 4 deficiency.   History Diane Carlson has a past medical history of Allergy; Cholesteatoma; Essential hypertension, benign; FH: TAH-BSO (total abdominal hysterectomy and bilateral salpingo-oophorectomy); GERD (gastroesophageal reflux disease); Gout; appendectomy; Hyperlipidemia; Kidney disease; Osteopenia; Paget's disease; and Vitamin D deficiency.   She has a past surgical history that includes Appendectomy; carotid endoectomy (Left); Abdominal hysterectomy; and cataract (Bilateral, 02/27/14).   Her family history includes Heart disease in her father and mother.She reports that she quit smoking about 6 years ago. Her smoking use included Cigarettes. She smoked 1.00 pack per day. She has never used smokeless tobacco. She reports that she does not drink alcohol or use drugs.    ROS Review of Systems  Constitutional: Negative for appetite change, chills, diaphoresis, fatigue, fever and unexpected weight change.  HENT: Negative for congestion, ear pain, hearing loss, postnasal drip, rhinorrhea, sneezing, sore throat and trouble swallowing.   Eyes: Negative for pain.  Respiratory:  Negative for cough, chest tightness and shortness of breath.   Cardiovascular: Negative for chest pain and palpitations.  Gastrointestinal: Negative for abdominal pain, constipation, diarrhea, nausea and vomiting.  Endocrine: Negative for cold intolerance, heat intolerance, polydipsia, polyphagia and polyuria.  Genitourinary: Negative for dysuria, frequency and menstrual problem.  Musculoskeletal: Negative for arthralgias and joint swelling.  Skin: Negative for rash.  Allergic/Immunologic: Negative for environmental allergies.  Neurological: Negative for dizziness, weakness, numbness and headaches.  Psychiatric/Behavioral: Negative for agitation and dysphoric mood.    Objective:  BP 133/72   Pulse 81   Temp 97.4 F (36.3 C) (Oral)   Ht 5' 3" (1.6 m)   Wt 109 lb 9.6 oz (49.7 kg)   BMI 19.41 kg/m   BP Readings from Last 3 Encounters:  03/04/16 133/72  01/20/16 118/65  09/02/15 129/73    Wt Readings from Last 3 Encounters:  03/04/16 109 lb 9.6 oz (49.7 kg)  02/21/16 109 lb 4 oz (49.6 kg)  01/20/16 109 lb 4 oz (49.6 kg)     Physical Exam  Constitutional: She is oriented to person, place, and time. She appears well-developed and well-nourished. No distress.  HENT:  Head: Normocephalic and atraumatic.  Right Ear: External ear normal.  Left Ear: External ear normal.  Nose: Nose normal.  Mouth/Throat: Oropharynx is clear and moist.  Eyes: Conjunctivae and EOM are normal. Pupils are equal, round, and reactive to light.  Neck: Normal range of motion. Neck supple. No thyromegaly present.  Cardiovascular: Normal rate, regular rhythm and normal heart sounds.   No murmur heard. Pulmonary/Chest: Effort normal and breath sounds normal. No respiratory distress. She has no wheezes. She has no rales. Right breast exhibits no inverted nipple, no mass and no tenderness. Left breast exhibits no inverted  nipple, no mass and no tenderness. Breasts are symmetrical.  Abdominal: Soft. Normal  appearance and bowel sounds are normal. She exhibits no distension, no abdominal bruit and no mass. There is no splenomegaly or hepatomegaly. There is no tenderness. There is no tenderness at McBurney's point and negative Murphy's sign.  Genitourinary: Rectum normal. Rectal exam shows no external hemorrhoid, no internal hemorrhoid, no mass and no tenderness.  Musculoskeletal: Normal range of motion. She exhibits no edema or tenderness.  Lymphadenopathy:    She has no cervical adenopathy.  Neurological: She is alert and oriented to person, place, and time. She has normal reflexes.  Skin: Skin is warm and dry. No rash noted.  Psychiatric: She has a normal mood and affect. Her behavior is normal. Judgment and thought content normal.     Lab Results  Component Value Date   WBC 7.6 09/02/2015   HGB 12.8 08/31/2014   HCT 36.5 09/02/2015   PLT 294 09/02/2015   GLUCOSE 92 02/05/2016   CHOL 146 02/28/2016   TRIG 150 (H) 02/28/2016   HDL 53 02/28/2016   LDLCALC 63 02/28/2016   ALT 15 02/05/2016   AST 19 02/05/2016   NA 144 02/05/2016   K 5.0 02/05/2016   CL 105 02/05/2016   CREATININE 1.96 (H) 02/05/2016   BUN 21 02/05/2016   CO2 23 02/05/2016   TSH 1.210 08/31/2014    Dg Chest 2 View  Result Date: 01/20/2016 CLINICAL DATA:  Renal colic EXAM: CHEST  2 VIEW COMPARISON:  01/30/2009 FINDINGS: Biapical scarring. Heart is normal size. No confluent airspace opacities or effusions. No acute bony abnormality. IMPRESSION: Biapical scarring.  No active disease. Electronically Signed   By: Rolm Baptise M.D.   On: 01/20/2016 09:12   Dg Abd 1 View  Result Date: 01/20/2016 CLINICAL DATA:  Left-sided flank pain EXAM: ABDOMEN - 1 VIEW COMPARISON:  03/04/2015 FINDINGS: Fecal material is noted throughout the colon consistent with a degree of constipation. Fleck like calcifications are noted over the lower pole of the left kidney consistent with nonobstructing renal calculi. No definitive ureteral stones  are seen. IMPRESSION: Constipation. Tiny nonobstructing left renal calculi. Electronically Signed   By: Inez Catalina M.D.   On: 01/20/2016 09:13   Ct Renal Stone Study  Result Date: 01/20/2016 CLINICAL DATA:  Left flank pain for 2 weeks.  Micro hematuria. EXAM: CT ABDOMEN AND PELVIS WITHOUT CONTRAST TECHNIQUE: Multidetector CT imaging of the abdomen and pelvis was performed following the standard protocol without IV contrast. COMPARISON:  None. FINDINGS: Lower chest: Visualized lung bases are unremarkable. Hepatobiliary: No gallstones are noted. No focal abnormality is seen in the liver on these unenhanced images. Pancreas: Normal. Spleen: Normal. Adrenals/Urinary Tract: Adrenal glands and kidneys appear normal. No hydronephrosis or renal obstruction is noted. No renal or ureteral calculi are noted. Urinary bladder is decompressed. Stomach/Bowel: Sigmoid diverticulosis is noted without inflammation. There is no evidence of bowel obstruction. Vascular/Lymphatic: Atherosclerosis of abdominal aorta is noted without aneurysm formation. No significant adenopathy is noted. Reproductive: Status post hysterectomy. Ovaries are not well visualized. Other: No abnormal fluid collection is noted. Musculoskeletal: Multilevel degenerative disc disease is noted in the upper lumbar spine. Cortical thickening and coarse trabeculae are noted in the right iliac bone, as well as the acetabulum and right pubic bone. This is concerning for possible Paget's disease. 14 mm lucency seen in right sided pubic symphysis. IMPRESSION: No hydronephrosis or renal obstruction is noted. No renal or ureteral calculi are noted. Aortic atherosclerosis. Sigmoid  diverticulosis without inflammation. Findings concerning for Paget's disease involving the right iliac and pubic bone and acetabulum. Also noted is 14 mm lucency seen in right side of pubic symphysis concerning for possible lytic lesion. MRI is recommended for further evaluation. These  results will be called to the ordering clinician or representative by the Radiologist Assistant, and communication documented in the PACS or zVision Dashboard. Electronically Signed   By: Marijo Conception, M.D.   On: 01/20/2016 10:57    Assessment & Plan:   Diane Carlson was seen today for annual exam.  Diagnoses and all orders for this visit:  Essential hypertension, benign -     CBC with Differential/Platelet -     CMP14+EGFR -     Urinalysis  Mixed hyperlipidemia -     CBC with Differential/Platelet -     CMP14+EGFR -     Urinalysis  Paget disease of bone -     CBC with Differential/Platelet -     CMP14+EGFR -     Urinalysis -     PTH, Intact and Calcium  Anemia of chronic disease -     CBC with Differential/Platelet -     CMP14+EGFR -     Urinalysis  Vitamin D deficiency -     VITAMIN D 25 Hydroxy (Vit-D Deficiency, Fractures)   I am having Ms. Gerbino maintain her aspirin, fish oil-omega-3 fatty acids, Garlic, cholecalciferol, calcitRIOL, desloratadine, rosuvastatin, and amLODipine.  No orders of the defined types were placed in this encounter.    Follow-up: Return in about 6 months (around 09/02/2016).  Claretta Fraise, M.D.

## 2016-03-05 LAB — CMP14+EGFR
A/G RATIO: 1.6 (ref 1.2–2.2)
ALT: 15 IU/L (ref 0–32)
AST: 22 IU/L (ref 0–40)
Albumin: 4.7 g/dL (ref 3.6–4.8)
Alkaline Phosphatase: 157 IU/L — ABNORMAL HIGH (ref 39–117)
BILIRUBIN TOTAL: 0.2 mg/dL (ref 0.0–1.2)
BUN / CREAT RATIO: 13 (ref 12–28)
BUN: 25 mg/dL (ref 8–27)
CO2: 23 mmol/L (ref 18–29)
Calcium: 9.8 mg/dL (ref 8.7–10.3)
Chloride: 102 mmol/L (ref 96–106)
Creatinine, Ser: 1.92 mg/dL — ABNORMAL HIGH (ref 0.57–1.00)
GFR calc Af Amer: 30 mL/min/{1.73_m2} — ABNORMAL LOW (ref >59)
GFR, EST NON AFRICAN AMERICAN: 26 mL/min/{1.73_m2} — AB (ref >59)
GLUCOSE: 100 mg/dL — AB (ref 65–99)
Globulin, Total: 2.9 g/dL (ref 1.5–4.5)
Potassium: 5 mmol/L (ref 3.5–5.2)
SODIUM: 142 mmol/L (ref 134–144)
TOTAL PROTEIN: 7.6 g/dL (ref 6.0–8.5)

## 2016-03-05 LAB — CBC WITH DIFFERENTIAL/PLATELET
BASOS ABS: 0 10*3/uL (ref 0.0–0.2)
Basos: 1 %
EOS (ABSOLUTE): 0.6 10*3/uL — AB (ref 0.0–0.4)
Eos: 8 %
Hematocrit: 35.1 % (ref 34.0–46.6)
Hemoglobin: 11.7 g/dL (ref 11.1–15.9)
IMMATURE GRANS (ABS): 0 10*3/uL (ref 0.0–0.1)
IMMATURE GRANULOCYTES: 0 %
LYMPHS: 24 %
Lymphocytes Absolute: 1.8 10*3/uL (ref 0.7–3.1)
MCH: 29.5 pg (ref 26.6–33.0)
MCHC: 33.3 g/dL (ref 31.5–35.7)
MCV: 89 fL (ref 79–97)
MONOS ABS: 0.8 10*3/uL (ref 0.1–0.9)
Monocytes: 11 %
NEUTROS PCT: 56 %
Neutrophils Absolute: 4.2 10*3/uL (ref 1.4–7.0)
PLATELETS: 315 10*3/uL (ref 150–379)
RBC: 3.96 x10E6/uL (ref 3.77–5.28)
RDW: 14.2 % (ref 12.3–15.4)
WBC: 7.3 10*3/uL (ref 3.4–10.8)

## 2016-03-05 LAB — PTH, INTACT AND CALCIUM
Calcium: 9.5 mg/dL (ref 8.7–10.3)
PTH: 71 pg/mL — ABNORMAL HIGH (ref 15–65)

## 2016-03-05 LAB — VITAMIN D 25 HYDROXY (VIT D DEFICIENCY, FRACTURES): Vit D, 25-Hydroxy: 29.6 ng/mL — ABNORMAL LOW (ref 30.0–100.0)

## 2016-03-06 ENCOUNTER — Encounter: Payer: Commercial Managed Care - HMO | Admitting: Family Medicine

## 2016-03-09 ENCOUNTER — Telehealth: Payer: Self-pay | Admitting: Family Medicine

## 2016-03-11 NOTE — Telephone Encounter (Signed)
Attempted to contact patient back. No answer.

## 2016-04-14 ENCOUNTER — Other Ambulatory Visit: Payer: Self-pay | Admitting: Family Medicine

## 2016-04-14 DIAGNOSIS — H04123 Dry eye syndrome of bilateral lacrimal glands: Secondary | ICD-10-CM | POA: Diagnosis not present

## 2016-04-14 DIAGNOSIS — H40033 Anatomical narrow angle, bilateral: Secondary | ICD-10-CM | POA: Diagnosis not present

## 2016-04-20 DIAGNOSIS — N184 Chronic kidney disease, stage 4 (severe): Secondary | ICD-10-CM | POA: Diagnosis not present

## 2016-04-21 DIAGNOSIS — H578 Other specified disorders of eye and adnexa: Secondary | ICD-10-CM | POA: Diagnosis not present

## 2016-04-30 ENCOUNTER — Other Ambulatory Visit: Payer: Self-pay | Admitting: Family Medicine

## 2016-04-30 MED ORDER — AMLODIPINE BESYLATE 10 MG PO TABS: 10.0000 mg | ORAL_TABLET | Freq: Every day | ORAL | 1 refills | Status: DC

## 2016-04-30 NOTE — Telephone Encounter (Signed)
done

## 2016-05-01 DIAGNOSIS — I10 Essential (primary) hypertension: Secondary | ICD-10-CM | POA: Diagnosis not present

## 2016-05-01 DIAGNOSIS — N2581 Secondary hyperparathyroidism of renal origin: Secondary | ICD-10-CM | POA: Diagnosis not present

## 2016-05-01 DIAGNOSIS — N184 Chronic kidney disease, stage 4 (severe): Secondary | ICD-10-CM | POA: Diagnosis not present

## 2016-05-01 DIAGNOSIS — D631 Anemia in chronic kidney disease: Secondary | ICD-10-CM | POA: Diagnosis not present

## 2016-05-04 ENCOUNTER — Telehealth: Payer: Self-pay | Admitting: Family Medicine

## 2016-05-04 NOTE — Telephone Encounter (Signed)
In process 

## 2016-05-05 ENCOUNTER — Other Ambulatory Visit: Payer: Self-pay | Admitting: Family Medicine

## 2016-05-06 NOTE — Telephone Encounter (Signed)
[email or web address]

## 2016-05-07 ENCOUNTER — Telehealth: Payer: Self-pay

## 2016-05-07 ENCOUNTER — Other Ambulatory Visit: Payer: Self-pay | Admitting: Family Medicine

## 2016-05-07 MED ORDER — LEVOCETIRIZINE DIHYDROCHLORIDE 5 MG PO TABS
5.0000 mg | ORAL_TABLET | Freq: Every evening | ORAL | 5 refills | Status: DC
Start: 2016-05-07 — End: 2016-12-01

## 2016-05-07 NOTE — Telephone Encounter (Signed)
I sent in the requested prescription 

## 2016-05-08 NOTE — Telephone Encounter (Signed)
Left message for patient , desloratine not covered so claritin sent in per Dr. Livia Snellen.

## 2016-05-12 MED ORDER — DESLORATADINE 5 MG PO TABS: 5.0000 mg | ORAL_TABLET | Freq: Every day | ORAL | 3 refills | Status: DC

## 2016-05-12 NOTE — Telephone Encounter (Signed)
Advised pt that she would ntbs and evaluated prior to anything being called in and pt voiced understanding. rx for clarinex sent in as requested.

## 2016-05-13 ENCOUNTER — Ambulatory Visit: Payer: Commercial Managed Care - HMO | Admitting: Family Medicine

## 2016-05-13 DIAGNOSIS — J0100 Acute maxillary sinusitis, unspecified: Secondary | ICD-10-CM | POA: Diagnosis not present

## 2016-05-25 ENCOUNTER — Other Ambulatory Visit: Payer: Self-pay | Admitting: Family Medicine

## 2016-07-29 ENCOUNTER — Ambulatory Visit (INDEPENDENT_AMBULATORY_CARE_PROVIDER_SITE_OTHER): Payer: Medicare PPO | Admitting: *Deleted

## 2016-07-29 VITALS — BP 133/73 | HR 68 | Temp 97.2°F | Ht 63.0 in | Wt 109.0 lb

## 2016-07-29 DIAGNOSIS — Z0000 Encounter for general adult medical examination without abnormal findings: Secondary | ICD-10-CM | POA: Diagnosis not present

## 2016-07-29 NOTE — Patient Instructions (Signed)
  Diane Carlson , Thank you for taking time to come for your Medicare Wellness Visit. I appreciate your ongoing commitment to your health goals. Please review the following plan we discussed and let me know if I can assist you in the future.   These are the goals we discussed: Goals    . Decrease the likelihood of falling          Remove rugs and small obstacles such as baskets etc. from walkways.    . Eat more fruits and vegetables    . HDL > 40    . HDL > 40          Continue low-fat diet and Crestor as well has your fish oil capsules.       This is a list of the screening recommended for you and due dates:  Health Maintenance  Topic Date Due  . DEXA scan (bone density measurement)  05/09/2016  . Flu Shot  10/28/2016  . Mammogram  12/22/2016  . Tetanus Vaccine  09/03/2020  . Colon Cancer Screening  01/03/2022  .  Hepatitis C: One time screening is recommended by Center for Disease Control  (CDC) for  adults born from [birth year] through 1964.   Completed  . Pneumonia vaccines  Completed

## 2016-07-29 NOTE — Progress Notes (Signed)
Subjective:   Diane Carlson is a 71 y.o. female who presents for an Initial Medicare Annual Wellness Visit  Very pleasant healthy lady. 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 She gets a lot of exercise from her hobbies.  She goes to dances twice a week, walks 2 miles on treadmill everyday and goes to gym daily. Her diet is good with 3 healthy meals daily. Her one goal is to eat more fresh fruits. She has been attending the same church for 40 years. She has 2 sons, one lives across the street and the other about 20 miles away. She has 4 grandchildren and 5 great grandchildren. Her health is the same as last year per patient.        Objective:    Today's Vitals   07/29/16 0839  BP: 133/73  Pulse: 68  Temp: 97.2 F (36.2 C)  TempSrc: Oral  Weight: 109 lb (49.4 kg)  Height: 5\' 3"  (1.6 m)   Body mass index is 19.31 kg/m.   Current Medications (verified) Outpatient Encounter Prescriptions as of 07/29/2016  Medication Sig  . amLODipine (NORVASC) 10 MG tablet Take 1 tablet (10 mg total) by mouth daily.  [name] aspirin 81 MG tablet Take 81 mg by mouth daily.    . calcitRIOL (ROCALTROL) 0.25 MCG capsule Take 0.25 mcg by mouth daily.  . cholecalciferol (VITAMIN D) 1000 UNITS tablet Take 1,000 Units by mouth daily.  . fish oil-omega-3 fatty acids 1000 MG capsule Take 2 g by mouth daily.  . Garlic 1696 MG CAPS Take by mouth.  . levocetirizine (XYZAL) 5 MG tablet Take 1 tablet (5 mg total) by mouth every evening.  . rosuvastatin (CRESTOR) 10 MG tablet TAKE 1 TABLET EVERY DAY  . [DISCONTINUED] desloratadine (CLARINEX) 5 MG tablet Take 1 Tablet by mouth once daily   No facility-administered encounter medications on file as of 07/29/2016.     Allergies (verified) Cephalexin; Pravastatin; Sulfa antibiotics; and Zetia [ezetimibe]   History: Past Medical History:  Diagnosis Date  . Allergy   . Cholesteatoma   . Essential hypertension, benign   . FH: TAH-BSO (total abdominal  hysterectomy and bilateral salpingo-oophorectomy)   . Gout   . Hx of appendectomy   . Hyperlipidemia   . Kidney disease   . Osteopenia   . Paget's disease   . Vitamin D deficiency    Past Surgical History:  Procedure Laterality Date  . ABDOMINAL HYSTERECTOMY    . APPENDECTOMY    . carotid endoectomy Left   . cataract Bilateral 02/27/14  . EYE SURGERY     catarat surgery bilaterally   Family History  Problem Relation Age of Onset  . Heart disease Mother   . Heart disease Father   . Hypertension Son   . Hypertension Son    Social History   Occupational History  . Not on file.   Social History Main Topics  . Smoking status: Former Smoker    Packs/day: 1.00    Types: Cigarettes    Quit date: 08/25/2009  . Smokeless tobacco: Never Used  . Alcohol use No  . Drug use: No  . Sexual activity: Not on file    Tobacco Counseling Counseling given: Not Answered she quit smoking in 2011  Activities of Daily Living In your present state of health, do you have any difficulty performing the following activities: 07/29/2016 09/02/2015  Hearing? N N  Vision? N N  Difficulty concentrating or making decisions? N N  Walking or climbing stairs? N N  Dressing or bathing? N N  Doing errands, shopping? N N  Some recent data might be hidden    Immunizations and Health Maintenance Immunization History  Administered Date(s) Administered  . Influenza Whole 12/28/2009  . Influenza,inj,Quad PF,36+ Mos 01/15/2014, 01/11/2015, 01/07/2016  . Pneumococcal Conjugate-13 08/31/2014  . Pneumococcal Polysaccharide-23 08/25/2012  . Tdap 09/04/2010  . Zoster 03/10/2013   Health Maintenance Due  Topic Date Due  . DEXA SCAN  05/09/2016    Patient Care Team: Timmothy Euler, MD as PCP - General (Family Medicine) Rexene Agent, MD as Attending Physician (Nephrology)  Indicate any recent Medical Services you may have received from other than [facility] providers in the past year (date may be  approximate).     Assessment:   This is a routine wellness examination for Diane Carlson.  Hearing/Vision screen No exam data present No hearing or vision problems30/30 Dietary issues and exercise activities discussed:    Goals    . Decrease the likelihood of falling          Remove rugs and small obstacles such as baskets etc. from walkways.    . Eat more fruits and vegetables    . HDL > 40    . HDL > 40          Continue low-fat diet and Crestor as well has your fish oil capsules.      Depression Screen PHQ 2/9 Scores 07/29/2016 03/04/2016 02/21/2016 01/20/2016 09/02/2015 05/16/2015 05/01/2015  PHQ - 2 Score 0 0 0 0 0 0 0    Fall Risk Fall Risk  07/29/2016 03/04/2016 02/21/2016 01/20/2016 09/02/2015  Falls in the past year? No No No No No    Cognitive Function: MMSE - Mini Mental State Exam 07/29/2016  Orientation to time 5  Orientation to Place 5  Registration 3  Attention/ Calculation 5  Recall 3  Language- name 2 objects 2  Language- repeat 1  Language- follow 3 step command 3  Language- read & follow direction 1  Write a sentence 1  Copy design 1  Total score 30    30/30    Screening Tests Health Maintenance  Topic Date Due  . DEXA SCAN  05/09/2016  . INFLUENZA VACCINE  10/28/2016  . MAMMOGRAM  12/22/2016  . TETANUS/TDAP  09/03/2020  . COLONOSCOPY  01/03/2022  . Hepatitis C Screening  Completed  . PNA vac Low Risk Adult  Completed      Plan:   Pt has a follow up appt with Dr. Wendi Snipes for her 6 mo. Check up. She will get an EKG and Dexa on this day. Dr. Wendi Snipes will also discuss a pap at this time.  I have personally reviewed and noted the following in the patient's chart:   . Medical and social history . Use of alcohol, tobacco or illicit drugs  . Current medications and supplements . Functional ability and status . Nutritional status . Physical activity . Advanced directives . List of other physicians . Hospitalizations, surgeries, and ER visits in  previous 12 months . Vitals . Screenings to include cognitive, depression, and falls . Referrals and appointments  In addition, I have reviewed and discussed with patient certain preventive protocols, quality metrics, and best practice recommendations. A written personalized care plan for preventive services as well as general preventive health recommendations were provided to patient.     Rana Snare, LPN   08/01/7320   I have reviewed and agree with  the above AWV documentation.   Terald Sleeper PA-C [facility] [address]  [place], [place] [address] [phone]

## 2016-08-13 ENCOUNTER — Encounter: Payer: Self-pay | Admitting: *Deleted

## 2016-08-25 ENCOUNTER — Other Ambulatory Visit: Payer: Self-pay | Admitting: Family Medicine

## 2016-09-02 DIAGNOSIS — Z9841 Cataract extraction status, right eye: Secondary | ICD-10-CM | POA: Diagnosis not present

## 2016-09-02 DIAGNOSIS — I129 Hypertensive chronic kidney disease with stage 1 through stage 4 chronic kidney disease, or unspecified chronic kidney disease: Secondary | ICD-10-CM | POA: Diagnosis not present

## 2016-09-02 DIAGNOSIS — E785 Hyperlipidemia, unspecified: Secondary | ICD-10-CM | POA: Diagnosis not present

## 2016-09-02 DIAGNOSIS — Z9842 Cataract extraction status, left eye: Secondary | ICD-10-CM | POA: Diagnosis not present

## 2016-09-02 DIAGNOSIS — Z90710 Acquired absence of both cervix and uterus: Secondary | ICD-10-CM | POA: Diagnosis not present

## 2016-09-02 DIAGNOSIS — N183 Chronic kidney disease, stage 3 (moderate): Secondary | ICD-10-CM | POA: Diagnosis not present

## 2016-09-02 DIAGNOSIS — Z681 Body mass index (BMI) 19 or less, adult: Secondary | ICD-10-CM | POA: Diagnosis not present

## 2016-09-03 ENCOUNTER — Ambulatory Visit: Payer: Medicare PPO | Admitting: Family Medicine

## 2016-09-03 DIAGNOSIS — M7742 Metatarsalgia, left foot: Secondary | ICD-10-CM | POA: Diagnosis not present

## 2016-09-03 DIAGNOSIS — M79671 Pain in right foot: Secondary | ICD-10-CM | POA: Diagnosis not present

## 2016-09-04 ENCOUNTER — Ambulatory Visit: Payer: Medicare PPO | Admitting: Family Medicine

## 2016-09-08 ENCOUNTER — Ambulatory Visit (INDEPENDENT_AMBULATORY_CARE_PROVIDER_SITE_OTHER): Payer: Medicare PPO | Admitting: Family Medicine

## 2016-09-08 ENCOUNTER — Encounter: Payer: Self-pay | Admitting: Family Medicine

## 2016-09-08 ENCOUNTER — Ambulatory Visit (INDEPENDENT_AMBULATORY_CARE_PROVIDER_SITE_OTHER): Payer: Medicare PPO

## 2016-09-08 VITALS — BP 127/78 | HR 86 | Temp 96.4°F | Ht 63.0 in | Wt 107.2 lb

## 2016-09-08 DIAGNOSIS — E782 Mixed hyperlipidemia: Secondary | ICD-10-CM | POA: Diagnosis not present

## 2016-09-08 DIAGNOSIS — M8580 Other specified disorders of bone density and structure, unspecified site: Secondary | ICD-10-CM

## 2016-09-08 DIAGNOSIS — N184 Chronic kidney disease, stage 4 (severe): Secondary | ICD-10-CM

## 2016-09-08 DIAGNOSIS — N289 Disorder of kidney and ureter, unspecified: Secondary | ICD-10-CM | POA: Diagnosis not present

## 2016-09-08 DIAGNOSIS — M8588 Other specified disorders of bone density and structure, other site: Secondary | ICD-10-CM | POA: Diagnosis not present

## 2016-09-08 NOTE — Progress Notes (Signed)
   HPI  Patient presents today here for follow-up chronic medical conditions.  Patient would like to get a DEXA scan. She has a history of osteopenia  She has kidney disease, she sees Dr. Joelyn Oms for this. She is on calcitriol and vitamin D.  Patient exercises regularly, walking 2 miles a day, doing Silver sneakers daily, and doing Zinda 4 times a week. She does not watch her diet.  She states that her energy and appetite are good.  PMH: Smoking status noted ROS: Per HPI  Objective: BP 127/78   Pulse 86   Temp (!) 96.4 F (35.8 C) (Oral)   Ht '5\' 3"'$  (1.6 m)   Wt 107 lb 3.2 oz (48.6 kg)   BMI 18.99 kg/m  Gen: NAD, alert, cooperative with exam HEENT: NCAT CV: RRR, good S1/S2, no murmur Resp: CTABL, no wheezes, non-labored Abd: SNTND, BS present, no guarding or organomegaly Ext: No edema, warm Neuro: Alert and oriented, No gross deficits  Assessment and plan:  # Kidney disease, chronic kidney disease stage 4 Patient has good follow-up with nephrology, asymptomatic clinically. Repeat labs today, previous GFR was 26  # Hyperlipidemia Clinically stable Continue Crestor, repeat labs. Fasting today  # Osteopenia Patient replacing vitamin D appropriately, also watching PTH with renal disease. Repeat DEXA scan, ordered today   Orders Placed This Encounter  Procedures  . DG [facility] DEXA    Order Specific Question:   Reason for Exam (SYMPTOM  OR DIAGNOSIS REQUIRED)    Answer:   opsteopenia follow up  . Lipid panel  . CMP14+EGFR  . CBC with Differential/Platelet  . PTH, Intact and Calcium  . VITAMIN D 25 Hydroxy (Vit-D Deficiency, Fractures)    No orders of the defined types were placed in this encounter.   Laroy Apple, MD [facility] 09/08/2016, 8:42 AM

## 2016-09-08 NOTE — Patient Instructions (Signed)
Great to see you!  Come back in 6 months unless you need [place] sooner.   We will call with labs, write a letter, or send a mychart message within 1 week to communicate our findings.

## 2016-09-09 LAB — CBC WITH DIFFERENTIAL/PLATELET
BASOS ABS: 0 10*3/uL (ref 0.0–0.2)
Basos: 1 %
EOS (ABSOLUTE): 0.4 10*3/uL (ref 0.0–0.4)
EOS: 5 %
HEMATOCRIT: 37.3 % (ref 34.0–46.6)
Hemoglobin: 12.6 g/dL (ref 11.1–15.9)
IMMATURE GRANULOCYTES: 1 %
Immature Grans (Abs): 0.1 10*3/uL (ref 0.0–0.1)
Lymphocytes Absolute: 1.5 10*3/uL (ref 0.7–3.1)
Lymphs: 18 %
MCH: 29.4 pg (ref 26.6–33.0)
MCHC: 33.8 g/dL (ref 31.5–35.7)
MCV: 87 fL (ref 79–97)
MONOS ABS: 0.9 10*3/uL (ref 0.1–0.9)
Monocytes: 11 %
NEUTROS PCT: 64 %
Neutrophils Absolute: 5.2 10*3/uL (ref 1.4–7.0)
Platelets: 324 10*3/uL (ref 150–379)
RBC: 4.28 x10E6/uL (ref 3.77–5.28)
RDW: 13.9 % (ref 12.3–15.4)
WBC: 8.1 10*3/uL (ref 3.4–10.8)

## 2016-09-09 LAB — CMP14+EGFR
ALK PHOS: 160 IU/L — AB (ref 39–117)
ALT: 13 IU/L (ref 0–32)
AST: 22 IU/L (ref 0–40)
Albumin/Globulin Ratio: 1.7 (ref 1.2–2.2)
Albumin: 4.6 g/dL (ref 3.6–4.8)
BILIRUBIN TOTAL: 0.3 mg/dL (ref 0.0–1.2)
BUN/Creatinine Ratio: 13 (ref 12–28)
BUN: 22 mg/dL (ref 8–27)
CHLORIDE: 103 mmol/L (ref 96–106)
CO2: 24 mmol/L (ref 20–29)
CREATININE: 1.73 mg/dL — AB (ref 0.57–1.00)
Calcium: 9.9 mg/dL (ref 8.7–10.3)
GFR calc Af Amer: 34 mL/min/{1.73_m2} — ABNORMAL LOW (ref >59)
GFR calc non Af Amer: 30 mL/min/{1.73_m2} — ABNORMAL LOW (ref >59)
GLUCOSE: 94 mg/dL (ref 65–99)
Globulin, Total: 2.7 g/dL (ref 1.5–4.5)
Potassium: 5.8 mmol/L — ABNORMAL HIGH (ref 3.5–5.2)
SODIUM: 142 mmol/L (ref 134–144)
Total Protein: 7.3 g/dL (ref 6.0–8.5)

## 2016-09-09 LAB — LIPID PANEL
CHOL/HDL RATIO: 2.5 ratio (ref 0.0–4.4)
Cholesterol, Total: 143 mg/dL (ref 100–199)
HDL: 58 mg/dL (ref >39)
LDL CALC: 60 mg/dL (ref 0–99)
Triglycerides: 123 mg/dL (ref 0–149)
VLDL Cholesterol Cal: 25 mg/dL (ref 5–40)

## 2016-09-09 LAB — PTH, INTACT AND CALCIUM
Calcium: 9.6 mg/dL (ref 8.7–10.3)
PTH: 45 pg/mL (ref 15–65)

## 2016-09-09 LAB — VITAMIN D 25 HYDROXY (VIT D DEFICIENCY, FRACTURES): VIT D 25 HYDROXY: 39.5 ng/mL (ref 30.0–100.0)

## 2016-09-10 ENCOUNTER — Other Ambulatory Visit: Payer: Medicare PPO

## 2016-09-10 ENCOUNTER — Other Ambulatory Visit: Payer: Self-pay | Admitting: *Deleted

## 2016-09-10 DIAGNOSIS — E875 Hyperkalemia: Secondary | ICD-10-CM

## 2016-09-10 LAB — BMP8+EGFR
BUN/Creatinine Ratio: 10 — ABNORMAL LOW (ref 12–28)
BUN: 18 mg/dL (ref 8–27)
CO2: 22 mmol/L (ref 20–29)
CREATININE: 1.85 mg/dL — AB (ref 0.57–1.00)
Calcium: 9.5 mg/dL (ref 8.7–10.3)
Chloride: 104 mmol/L (ref 96–106)
GFR calc Af Amer: 32 mL/min/{1.73_m2} — ABNORMAL LOW (ref >59)
GFR, EST NON AFRICAN AMERICAN: 27 mL/min/{1.73_m2} — AB (ref >59)
Glucose: 88 mg/dL (ref 65–99)
Potassium: 5.5 mmol/L — ABNORMAL HIGH (ref 3.5–5.2)
SODIUM: 139 mmol/L (ref 134–144)

## 2016-09-11 ENCOUNTER — Telehealth: Payer: Self-pay | Admitting: Family Medicine

## 2016-09-11 DIAGNOSIS — E875 Hyperkalemia: Secondary | ICD-10-CM

## 2016-09-11 NOTE — Addendum Note (Signed)
Addended by: Nigel Berthold C on: 09/11/2016 02:52 PM   Modules accepted: Orders

## 2016-09-11 NOTE — Telephone Encounter (Signed)
Asking nursing to inform patient of ellevated potassium.   I would like to make sure she has a nephrologist  Her chronic kidney disease.  Her creatinine is stable compared to previous checks. Her potassium is down slightly.   I would recommend repeat BMP in 1 week. Please be seen for any palpitations.   Laroy Apple, MD [facility] 09/11/2016, 12:20 PM

## 2016-09-11 NOTE — Telephone Encounter (Signed)
Patient aware.

## 2016-09-11 NOTE — Telephone Encounter (Signed)
Patient aware and verbalizes understanding- does have nephrologist. Lab placed.

## 2016-09-18 ENCOUNTER — Other Ambulatory Visit: Payer: Medicare PPO

## 2016-09-18 DIAGNOSIS — E875 Hyperkalemia: Secondary | ICD-10-CM | POA: Diagnosis not present

## 2016-09-19 LAB — BMP8+EGFR
BUN / CREAT RATIO: 9 — AB (ref 12–28)
BUN: 17 mg/dL (ref 8–27)
CALCIUM: 9.8 mg/dL (ref 8.7–10.3)
CO2: 22 mmol/L (ref 20–29)
CREATININE: 1.97 mg/dL — AB (ref 0.57–1.00)
Chloride: 105 mmol/L (ref 96–106)
GFR calc Af Amer: 29 mL/min/{1.73_m2} — ABNORMAL LOW (ref >59)
GFR calc non Af Amer: 25 mL/min/{1.73_m2} — ABNORMAL LOW (ref >59)
GLUCOSE: 91 mg/dL (ref 65–99)
Potassium: 4.3 mmol/L (ref 3.5–5.2)
Sodium: 143 mmol/L (ref 134–144)

## 2016-10-19 ENCOUNTER — Other Ambulatory Visit: Payer: Medicare PPO

## 2016-10-19 DIAGNOSIS — N184 Chronic kidney disease, stage 4 (severe): Secondary | ICD-10-CM | POA: Diagnosis not present

## 2016-11-06 DIAGNOSIS — I10 Essential (primary) hypertension: Secondary | ICD-10-CM | POA: Diagnosis not present

## 2016-11-06 DIAGNOSIS — N2581 Secondary hyperparathyroidism of renal origin: Secondary | ICD-10-CM | POA: Diagnosis not present

## 2016-11-06 DIAGNOSIS — D631 Anemia in chronic kidney disease: Secondary | ICD-10-CM | POA: Diagnosis not present

## 2016-11-06 DIAGNOSIS — N184 Chronic kidney disease, stage 4 (severe): Secondary | ICD-10-CM | POA: Diagnosis not present

## 2016-11-10 ENCOUNTER — Other Ambulatory Visit: Payer: Self-pay | Admitting: Family Medicine

## 2016-12-01 ENCOUNTER — Other Ambulatory Visit: Payer: Self-pay | Admitting: Family Medicine

## 2016-12-02 IMAGING — CT CT RENAL STONE PROTOCOL
2 of 4 series · 16 of 46 positions shown, 18 images · non-contrast
Comparison: None.

CLINICAL DATA: Left flank pain for 2 weeks.  Micro hematuria.

EXAM:
CT ABDOMEN AND PELVIS WITHOUT CONTRAST
TECHNIQUE: Multidetector CT imaging of the abdomen and pelvis was performed
following the standard protocol without IV contrast.

[Series 2: axial st · axial · 0.65mm/px · z∈[+1059,+1419]mm · 13 of 80 slices shown, 15 images]
[im 4/80  soft-tissue]
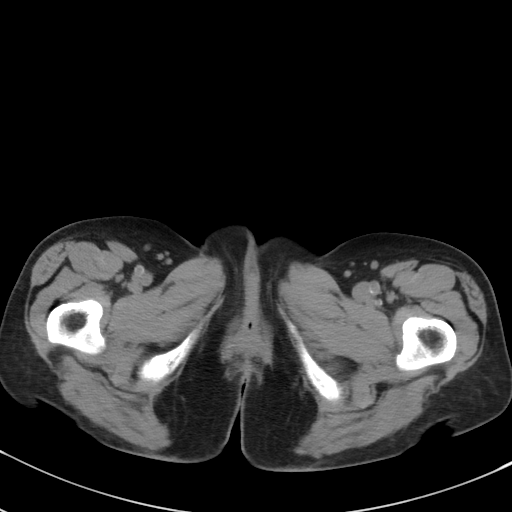
[im 4/80  bone]
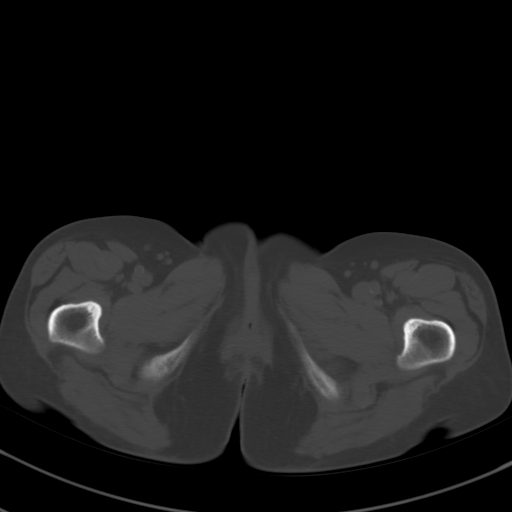
[im 11/80  soft-tissue]
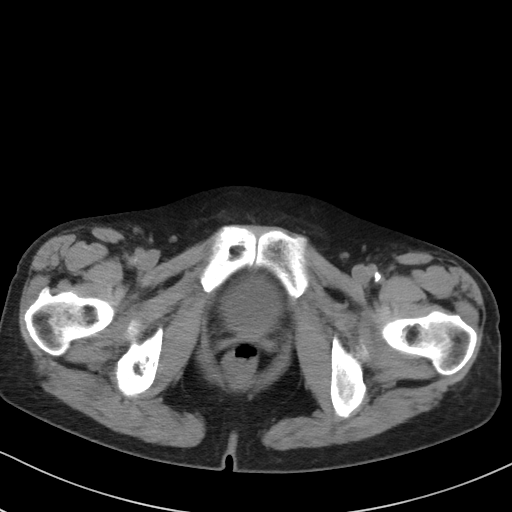
[im 18/80  soft-tissue]
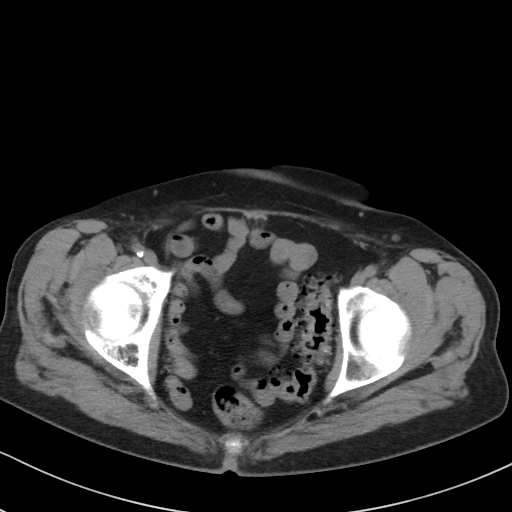
[im 21/80  soft-tissue]
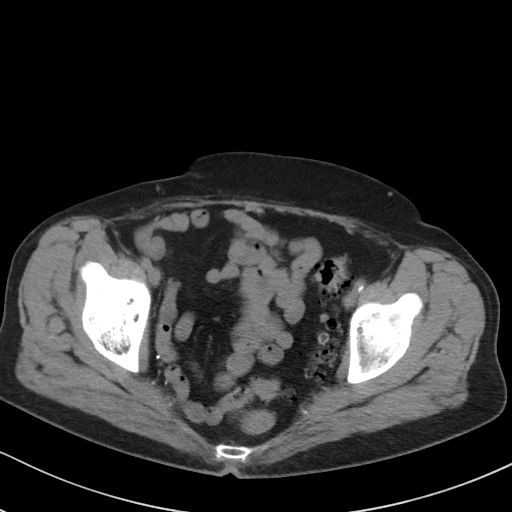
[im 28/80  soft-tissue]
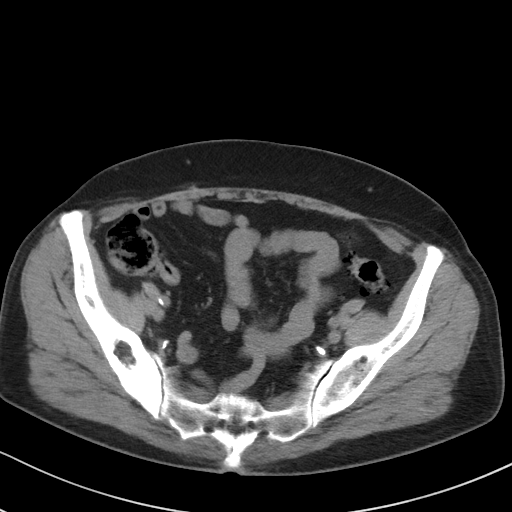
[im 35/80  soft-tissue]
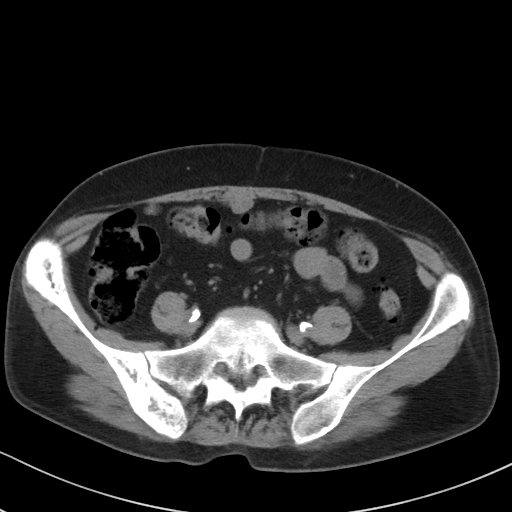
[im 42/80  soft-tissue]
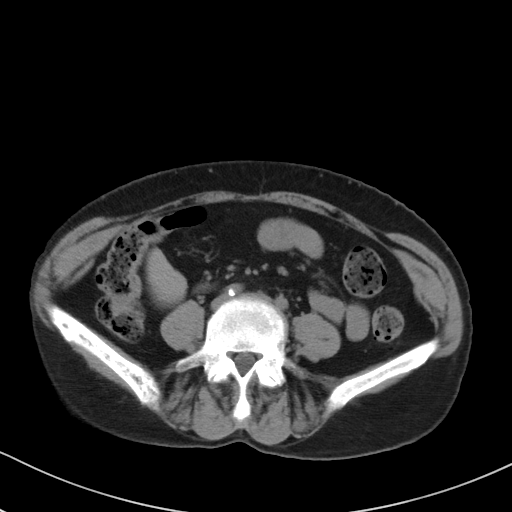
[im 45/80  soft-tissue]
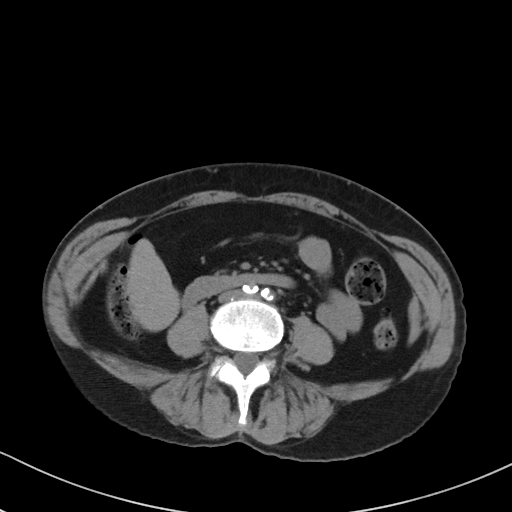
[im 52/80  soft-tissue]
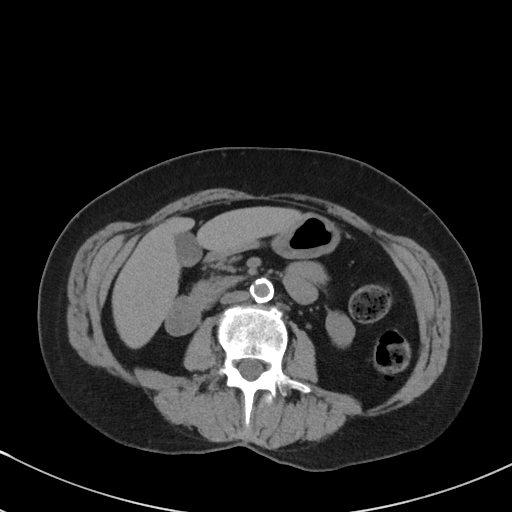
[im 52/80  bone]
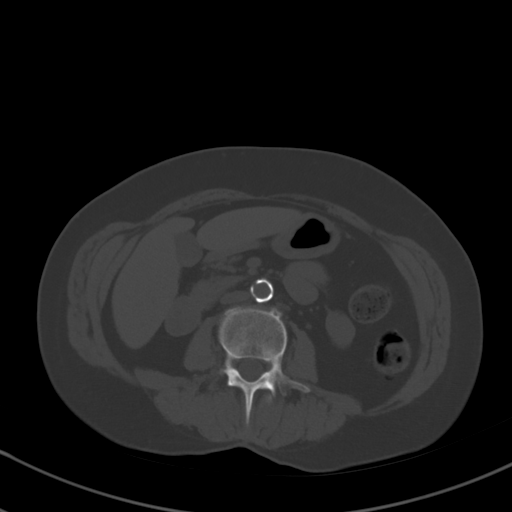
[im 59/80  soft-tissue]
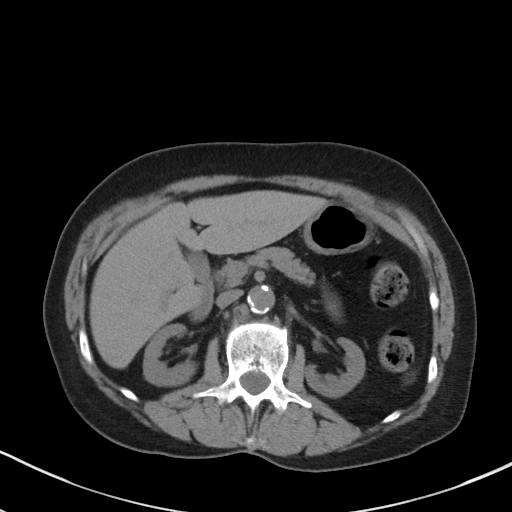
[im 62/80  soft-tissue]
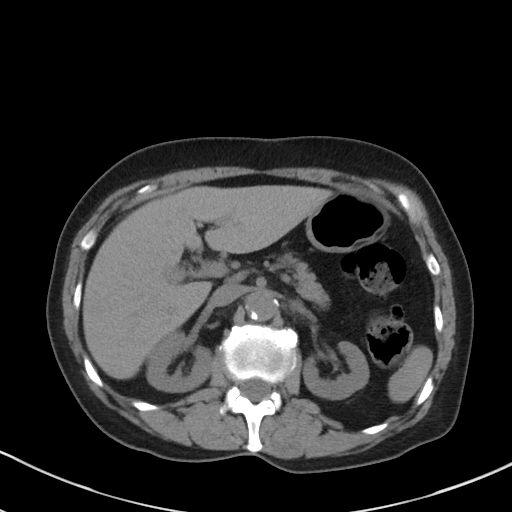
[im 69/80  soft-tissue]
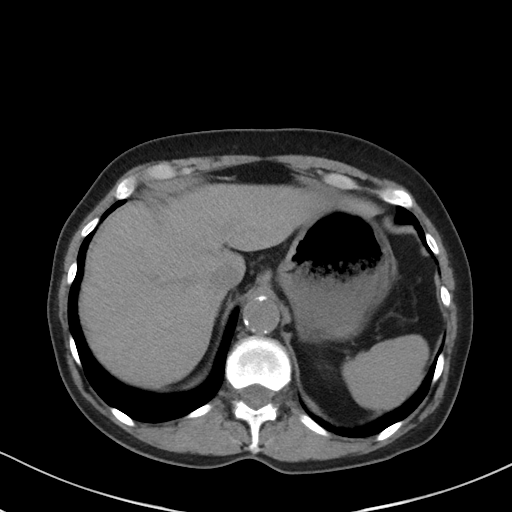
[im 76/80  soft-tissue]
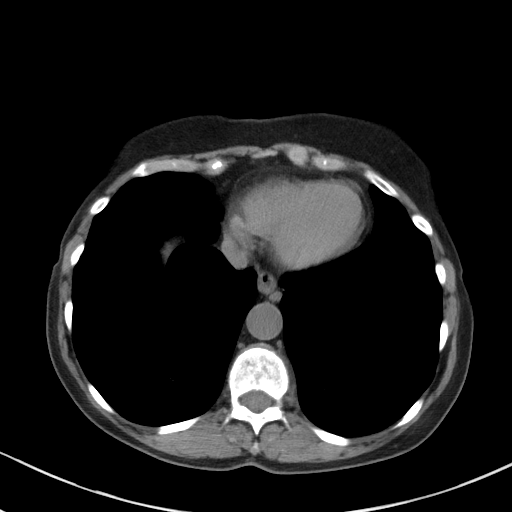

[Series 3: coronal st · coronal · 0.60mm/px · 3 of 77 slices shown]
[im 26/77  soft-tissue]
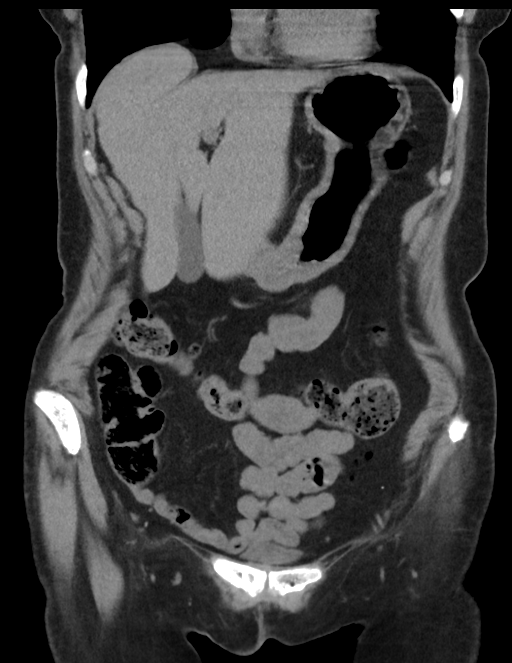
[im 34/77  soft-tissue]
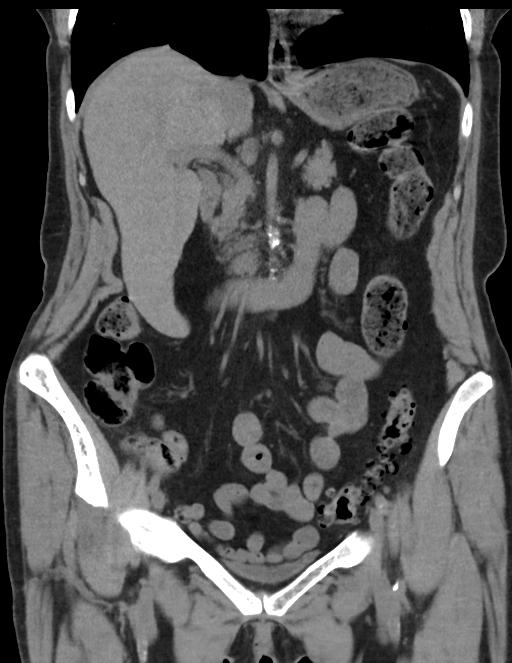
[im 43/77  soft-tissue]
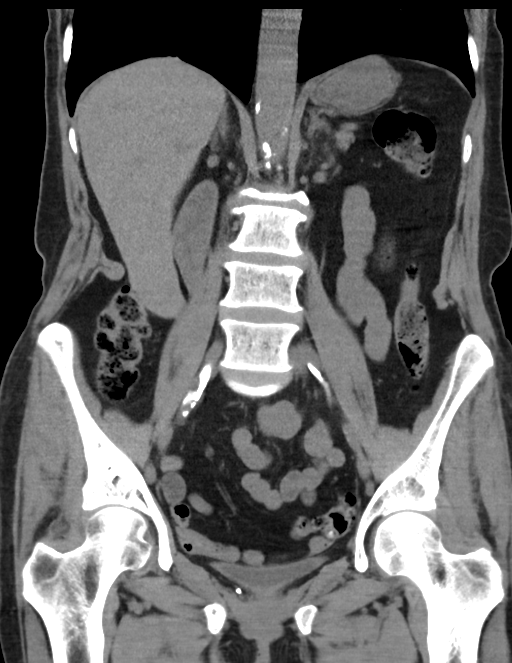

[16 of 46 positions shown; findings below may reference images not displayed]

FINDINGS: Lower chest: Visualized lung bases are unremarkable.

Hepatobiliary: No gallstones are noted. No focal abnormality is seen
in the liver on these unenhanced images.

Pancreas: Normal.

Spleen: Normal.

Adrenals/Urinary Tract: Adrenal glands and kidneys appear normal. No
hydronephrosis or renal obstruction is noted. No renal or ureteral
calculi are noted. Urinary bladder is decompressed.

Stomach/Bowel: Sigmoid diverticulosis is noted without inflammation.
There is no evidence of bowel obstruction.

Vascular/Lymphatic: Atherosclerosis of abdominal aorta is noted
without aneurysm formation. No significant adenopathy is noted.

Reproductive: Status post hysterectomy. Ovaries are not well
visualized.

Other: No abnormal fluid collection is noted.

Musculoskeletal: Multilevel degenerative disc disease is noted in
the upper lumbar spine. Cortical thickening and coarse trabeculae
are noted in the right iliac bone, as well as the acetabulum and
right pubic bone. This is concerning for possible Paget's disease.
14 mm lucency seen in right sided pubic symphysis.
IMPRESSION: No hydronephrosis or renal obstruction is noted. No renal or
ureteral calculi are noted.

Aortic atherosclerosis.

Sigmoid diverticulosis without inflammation.

Findings concerning for Paget's disease involving the right iliac
and pubic bone and acetabulum. Also noted is 14 mm lucency seen in
right side of pubic symphysis concerning for possible lytic lesion.
MRI is recommended for further evaluation. These results will be
called to the ordering clinician or representative by the
Radiologist Assistant, and communication documented in the PACS or
zVision Dashboard.

## 2016-12-11 ENCOUNTER — Telehealth: Payer: Self-pay | Admitting: Family Medicine

## 2016-12-11 NOTE — Telephone Encounter (Signed)
Left message to schedule mammo

## 2017-01-11 ENCOUNTER — Other Ambulatory Visit: Payer: Self-pay | Admitting: Family Medicine

## 2017-01-13 DIAGNOSIS — H10013 Acute follicular conjunctivitis, bilateral: Secondary | ICD-10-CM | POA: Diagnosis not present

## 2017-02-05 ENCOUNTER — Encounter: Payer: Self-pay | Admitting: Family Medicine

## 2017-02-05 ENCOUNTER — Ambulatory Visit: Payer: Medicare PPO | Admitting: Family Medicine

## 2017-02-05 VITALS — BP 136/75 | HR 86 | Temp 97.7°F | Ht 63.0 in | Wt 107.0 lb

## 2017-02-05 DIAGNOSIS — J0111 Acute recurrent frontal sinusitis: Secondary | ICD-10-CM | POA: Diagnosis not present

## 2017-02-05 MED ORDER — FLUTICASONE PROPIONATE 50 MCG/ACT NA SUSP: 1.0000 | Freq: Two times a day (BID) | NASAL | 6 refills | Status: DC | PRN

## 2017-02-05 MED ORDER — DOXYCYCLINE HYCLATE 100 MG PO TABS: 100.0000 mg | ORAL_TABLET | Freq: Two times a day (BID) | ORAL | 0 refills | Status: DC

## 2017-02-05 NOTE — Progress Notes (Signed)
BP 136/75   Pulse 86   Temp 97.7 F (36.5 C) (Oral)   Ht 5\' 3"  (1.6 m)   Wt 107 lb (48.5 kg)   BMI 18.95 kg/m    Subjective:    Patient ID: Diane Carlson, female    DOB: 1946-02-20, 71 y.o.   MRN: [id number]  HPI: Diane Carlson is a 71 y.o. female presenting on 02/05/2017 for Sinus Drainage, hoarseness and Left eye swelling (x 2 weeks, saw eye doctor who prescribed antibiotics by mouth, completed a week ago)   HPI Cough and some hoarseness and sinus drainage Patient comes in with complaints of cough and some hoarseness and sinus drainage that is been going on for 2 weeks.  She saw an eye doctor just prior to that and was prescribed amoxicillin for which she took but it did not seem to help much.  Seems to have gotten worse since that time.  She is not using anything else for it at this point.  She denies any fevers or chills but does have some achiness.  She has been having a cough that is productive of phlegm that is yellow and green.  She has a lot of postnasal drainage and sinus pressure.  Relevant past medical, surgical, family and social history reviewed and updated as indicated. Interim medical history since our last visit reviewed. Allergies and medications reviewed and updated.  Review of Systems  Constitutional: Negative for chills and fever.  HENT: Positive for congestion, postnasal drip, rhinorrhea, sinus pressure, sneezing and sore throat. Negative for ear discharge and ear pain.   Eyes: Negative for pain, redness and visual disturbance.  Respiratory: Positive for cough. Negative for chest tightness and shortness of breath.   Cardiovascular: Negative for chest pain and leg swelling.  Genitourinary: Negative for difficulty urinating and dysuria.  Musculoskeletal: Negative for back pain and gait problem.  Skin: Negative for rash.  Neurological: Negative for light-headedness and headaches.  Psychiatric/Behavioral: Negative for agitation and behavioral problems.  All other  systems reviewed and are negative.   Per HPI unless specifically indicated above        Objective:    BP 136/75   Pulse 86   Temp 97.7 F (36.5 C) (Oral)   Ht 5\' 3"  (1.6 m)   Wt 107 lb (48.5 kg)   BMI 18.95 kg/m   Wt Readings from Last 3 Encounters:  02/05/17 107 lb (48.5 kg)  09/08/16 107 lb 3.2 oz (48.6 kg)  07/29/16 109 lb (49.4 kg)    Physical Exam  Constitutional: She is oriented to person, place, and time. She appears well-developed and well-nourished. No distress.  HENT:  Right Ear: Tympanic membrane, external ear and ear canal normal.  Left Ear: Tympanic membrane, external ear and ear canal normal.  Nose: Mucosal edema and rhinorrhea present. No epistaxis. Right sinus exhibits no maxillary sinus tenderness and no frontal sinus tenderness. Left sinus exhibits no maxillary sinus tenderness and no frontal sinus tenderness.  Mouth/Throat: Uvula is midline and mucous membranes are normal. Posterior oropharyngeal edema and posterior oropharyngeal erythema present. No oropharyngeal exudate or tonsillar abscesses.  Eyes: Conjunctivae and EOM are normal.  Neck: Neck supple. No thyromegaly present.  Cardiovascular: Normal rate, regular rhythm, normal heart sounds and intact distal pulses.  No murmur heard. Pulmonary/Chest: Effort normal and breath sounds normal. No respiratory distress. She has no wheezes. She has no rales.  Musculoskeletal: Normal range of motion.  Lymphadenopathy:    She has no cervical adenopathy.  Neurological: She is alert and oriented to person, place, and time. Coordination normal.  Skin: Skin is warm and dry. No rash noted. She is not diaphoretic.  Psychiatric: She has a normal mood and affect. Her behavior is normal.  Vitals reviewed.     Assessment & Plan:   Problem List Items Addressed This Visit    None    Visit Diagnoses    Acute recurrent frontal sinusitis    -  Primary   Relevant Medications   doxycycline (VIBRA-TABS) 100 MG tablet    fluticasone (FLONASE) 50 MCG/ACT nasal spray       Follow up plan: Return if symptoms worsen or fail to improve.  Counseling provided for all of the vaccine components No orders of the defined types were placed in this encounter.   Caryl Pina, MD [facility] 02/05/2017, 11:36 AM

## 2017-02-25 DIAGNOSIS — Z86010 Personal history of colonic polyps: Secondary | ICD-10-CM | POA: Diagnosis not present

## 2017-02-25 DIAGNOSIS — K5730 Diverticulosis of large intestine without perforation or abscess without bleeding: Secondary | ICD-10-CM | POA: Diagnosis not present

## 2017-02-25 DIAGNOSIS — D126 Benign neoplasm of colon, unspecified: Secondary | ICD-10-CM | POA: Diagnosis not present

## 2017-03-02 ENCOUNTER — Ambulatory Visit (INDEPENDENT_AMBULATORY_CARE_PROVIDER_SITE_OTHER): Payer: Medicare PPO | Admitting: *Deleted

## 2017-03-02 DIAGNOSIS — Z23 Encounter for immunization: Secondary | ICD-10-CM

## 2017-03-02 DIAGNOSIS — D126 Benign neoplasm of colon, unspecified: Secondary | ICD-10-CM | POA: Diagnosis not present

## 2017-03-11 ENCOUNTER — Ambulatory Visit: Payer: Medicare PPO | Admitting: Family Medicine

## 2017-03-11 ENCOUNTER — Encounter: Payer: Self-pay | Admitting: Family Medicine

## 2017-03-11 VITALS — BP 126/87 | HR 87 | Temp 95.0°F | Ht 63.0 in | Wt 108.4 lb

## 2017-03-11 DIAGNOSIS — E782 Mixed hyperlipidemia: Secondary | ICD-10-CM | POA: Diagnosis not present

## 2017-03-11 DIAGNOSIS — I10 Essential (primary) hypertension: Secondary | ICD-10-CM

## 2017-03-11 DIAGNOSIS — N184 Chronic kidney disease, stage 4 (severe): Secondary | ICD-10-CM | POA: Diagnosis not present

## 2017-03-11 DIAGNOSIS — R899 Unspecified abnormal finding in specimens from other organs, systems and tissues: Secondary | ICD-10-CM

## 2017-03-11 NOTE — Progress Notes (Signed)
   HPI  Patient presents today here for routine follow-up chronic medical problems.  Hypertension Good medication compliance, no headache or chest pain.  Hyperlipidemia. Good medication compliance and tolerance. Not watching diet. Patient exercises daily walking 2 or more miles a day, also does Zumba twice weekly.    PMH: Smoking status noted ROS: Per HPI  Objective: BP 126/87   Pulse 87   Temp (!) 95 F (35 C) (Oral)   Ht 5\' 3"  (1.6 m)   Wt 108 lb 6.4 oz (49.2 kg)   BMI 19.20 kg/m  Gen: NAD, alert, cooperative with exam HEENT: NCAT,  CV: RRR, good S1/S2 Resp: CTABL, no wheezes, non-labored Ext: No edema, warm Neuro: Alert and oriented, No gross deficits  Assessment and plan:  #Hypertension Well-controlled on current medications, no changes Repeat labs.  #Chronic kidney disease stage IV Repeat labs Previously elevated potassium, continue to monitor  #Hyperlipidemia Previously well controlled, repeat labs, continue [place], MD [facility] 03/11/2017, 8:17 AM

## 2017-03-11 NOTE — Patient Instructions (Signed)
Great to see you!  Have fun in [place]!  Come back in 6 months

## 2017-03-12 ENCOUNTER — Telehealth: Payer: Self-pay | Admitting: Family Medicine

## 2017-03-12 LAB — CMP14+EGFR
ALBUMIN: 4.4 g/dL (ref 3.5–4.8)
ALK PHOS: 199 IU/L — AB (ref 39–117)
ALT: 17 IU/L (ref 0–32)
AST: 23 IU/L (ref 0–40)
Albumin/Globulin Ratio: 1.6 (ref 1.2–2.2)
BILIRUBIN TOTAL: 0.2 mg/dL (ref 0.0–1.2)
BUN / CREAT RATIO: 10 — AB (ref 12–28)
BUN: 17 mg/dL (ref 8–27)
CHLORIDE: 105 mmol/L (ref 96–106)
CO2: 24 mmol/L (ref 20–29)
Calcium: 9.7 mg/dL (ref 8.7–10.3)
Creatinine, Ser: 1.71 mg/dL — ABNORMAL HIGH (ref 0.57–1.00)
GFR calc Af Amer: 34 mL/min/{1.73_m2} — ABNORMAL LOW (ref >59)
GFR calc non Af Amer: 30 mL/min/{1.73_m2} — ABNORMAL LOW (ref >59)
GLUCOSE: 93 mg/dL (ref 65–99)
Globulin, Total: 2.8 g/dL (ref 1.5–4.5)
Potassium: 5 mmol/L (ref 3.5–5.2)
Sodium: 141 mmol/L (ref 134–144)
Total Protein: 7.2 g/dL (ref 6.0–8.5)

## 2017-03-12 LAB — CBC WITH DIFFERENTIAL/PLATELET
BASOS ABS: 0 10*3/uL (ref 0.0–0.2)
Basos: 0 %
EOS (ABSOLUTE): 0.7 10*3/uL — AB (ref 0.0–0.4)
Eos: 7 %
HEMOGLOBIN: 12.4 g/dL (ref 11.1–15.9)
Hematocrit: 36.6 % (ref 34.0–46.6)
Immature Grans (Abs): 0 10*3/uL (ref 0.0–0.1)
Immature Granulocytes: 0 %
LYMPHS ABS: 1.8 10*3/uL (ref 0.7–3.1)
Lymphs: 19 %
MCH: 29.7 pg (ref 26.6–33.0)
MCHC: 33.9 g/dL (ref 31.5–35.7)
MCV: 88 fL (ref 79–97)
MONOCYTES: 7 %
Monocytes Absolute: 0.7 10*3/uL (ref 0.1–0.9)
Neutrophils Absolute: 6.2 10*3/uL (ref 1.4–7.0)
Neutrophils: 67 %
PLATELETS: 320 10*3/uL (ref 150–379)
RBC: 4.18 x10E6/uL (ref 3.77–5.28)
RDW: 14.6 % (ref 12.3–15.4)
WBC: 9.4 10*3/uL (ref 3.4–10.8)

## 2017-03-12 LAB — LIPID PANEL
CHOLESTEROL TOTAL: 152 mg/dL (ref 100–199)
Chol/HDL Ratio: 2.8 ratio (ref 0.0–4.4)
HDL: 55 mg/dL (ref >39)
LDL CALC: 67 mg/dL (ref 0–99)
TRIGLYCERIDES: 148 mg/dL (ref 0–149)
VLDL Cholesterol Cal: 30 mg/dL (ref 5–40)

## 2017-03-12 NOTE — Telephone Encounter (Signed)
Refer to lab notes 

## 2017-03-12 NOTE — Addendum Note (Signed)
Addended by: Nigel Berthold C on: 03/12/2017 03:07 PM   Modules accepted: Orders

## 2017-03-16 ENCOUNTER — Other Ambulatory Visit: Payer: Medicare PPO

## 2017-03-16 DIAGNOSIS — R899 Unspecified abnormal finding in specimens from other organs, systems and tissues: Secondary | ICD-10-CM

## 2017-03-17 LAB — CMP14+EGFR
ALBUMIN: 4.3 g/dL (ref 3.5–4.8)
ALT: 18 IU/L (ref 0–32)
AST: 25 IU/L (ref 0–40)
Albumin/Globulin Ratio: 1.7 (ref 1.2–2.2)
Alkaline Phosphatase: 195 IU/L — ABNORMAL HIGH (ref 39–117)
BUN / CREAT RATIO: 12 (ref 12–28)
BUN: 23 mg/dL (ref 8–27)
Bilirubin Total: 0.3 mg/dL (ref 0.0–1.2)
CALCIUM: 9.1 mg/dL (ref 8.7–10.3)
CO2: 23 mmol/L (ref 20–29)
CREATININE: 1.96 mg/dL — AB (ref 0.57–1.00)
Chloride: 107 mmol/L — ABNORMAL HIGH (ref 96–106)
GFR, EST AFRICAN AMERICAN: 29 mL/min/{1.73_m2} — AB (ref >59)
GFR, EST NON AFRICAN AMERICAN: 25 mL/min/{1.73_m2} — AB (ref >59)
GLUCOSE: 92 mg/dL (ref 65–99)
Globulin, Total: 2.6 g/dL (ref 1.5–4.5)
Potassium: 4.9 mmol/L (ref 3.5–5.2)
Sodium: 143 mmol/L (ref 134–144)
TOTAL PROTEIN: 6.9 g/dL (ref 6.0–8.5)

## 2017-03-29 ENCOUNTER — Other Ambulatory Visit: Payer: Self-pay | Admitting: Family Medicine

## 2017-04-01 DIAGNOSIS — L03031 Cellulitis of right toe: Secondary | ICD-10-CM | POA: Diagnosis not present

## 2017-04-01 DIAGNOSIS — M79674 Pain in right toe(s): Secondary | ICD-10-CM | POA: Diagnosis not present

## 2017-04-05 ENCOUNTER — Encounter: Payer: Self-pay | Admitting: Family Medicine

## 2017-04-05 DIAGNOSIS — Z1231 Encounter for screening mammogram for malignant neoplasm of breast: Secondary | ICD-10-CM | POA: Diagnosis not present

## 2017-04-06 ENCOUNTER — Other Ambulatory Visit: Payer: Self-pay | Admitting: Family Medicine

## 2017-04-15 DIAGNOSIS — M79676 Pain in unspecified toe(s): Secondary | ICD-10-CM | POA: Diagnosis not present

## 2017-04-15 DIAGNOSIS — L03031 Cellulitis of right toe: Secondary | ICD-10-CM | POA: Diagnosis not present

## 2017-04-23 DIAGNOSIS — J301 Allergic rhinitis due to pollen: Secondary | ICD-10-CM | POA: Diagnosis not present

## 2017-04-23 DIAGNOSIS — Z681 Body mass index (BMI) 19 or less, adult: Secondary | ICD-10-CM | POA: Diagnosis not present

## 2017-04-23 DIAGNOSIS — E785 Hyperlipidemia, unspecified: Secondary | ICD-10-CM | POA: Diagnosis not present

## 2017-04-23 DIAGNOSIS — I10 Essential (primary) hypertension: Secondary | ICD-10-CM | POA: Diagnosis not present

## 2017-04-23 DIAGNOSIS — Z87891 Personal history of nicotine dependence: Secondary | ICD-10-CM | POA: Diagnosis not present

## 2017-06-12 ENCOUNTER — Other Ambulatory Visit: Payer: Self-pay | Admitting: Family Medicine

## 2017-06-21 DIAGNOSIS — N189 Chronic kidney disease, unspecified: Secondary | ICD-10-CM | POA: Diagnosis not present

## 2017-06-21 DIAGNOSIS — N2581 Secondary hyperparathyroidism of renal origin: Secondary | ICD-10-CM | POA: Diagnosis not present

## 2017-06-21 DIAGNOSIS — N184 Chronic kidney disease, stage 4 (severe): Secondary | ICD-10-CM | POA: Diagnosis not present

## 2017-07-06 DIAGNOSIS — N184 Chronic kidney disease, stage 4 (severe): Secondary | ICD-10-CM | POA: Diagnosis not present

## 2017-07-06 DIAGNOSIS — D631 Anemia in chronic kidney disease: Secondary | ICD-10-CM | POA: Diagnosis not present

## 2017-07-06 DIAGNOSIS — N2581 Secondary hyperparathyroidism of renal origin: Secondary | ICD-10-CM | POA: Diagnosis not present

## 2017-07-06 DIAGNOSIS — I129 Hypertensive chronic kidney disease with stage 1 through stage 4 chronic kidney disease, or unspecified chronic kidney disease: Secondary | ICD-10-CM | POA: Diagnosis not present

## 2017-08-02 ENCOUNTER — Ambulatory Visit: Payer: Medicare PPO | Admitting: *Deleted

## 2017-08-04 ENCOUNTER — Ambulatory Visit (INDEPENDENT_AMBULATORY_CARE_PROVIDER_SITE_OTHER): Payer: Medicare PPO | Admitting: *Deleted

## 2017-08-04 ENCOUNTER — Encounter: Payer: Self-pay | Admitting: *Deleted

## 2017-08-04 VITALS — BP 131/68 | HR 77 | Ht 62.0 in | Wt 107.0 lb

## 2017-08-04 DIAGNOSIS — Z0000 Encounter for general adult medical examination without abnormal findings: Secondary | ICD-10-CM

## 2017-08-04 NOTE — Progress Notes (Addendum)
Subjective:   Diane Carlson is a 73 y.o. female who presents for a Medicare Annual Wellness Visit. Diane Carlson lives at home alone. Her husband passed away 6 years ago. She retired from [employer] when her husband got sick. She has 2 adult sons. One lives across the street. Both have 2 children.    Review of Systems    2reports that her health is unchanged compared to last year.  Cardiac Risk Factors include: hypertension;dyslipidemia;advanced age (>21men, >64 women);family history of premature cardiovascular disease   Other systems negative unless otherwise noted in other area.       Current Medications (verified) Outpatient Encounter Medications as of 08/04/2017  Medication Sig  . aspirin 81 MG tablet Take 81 mg by mouth daily.    . calcitRIOL (ROCALTROL) 0.25 MCG capsule Take 0.25 mcg by mouth daily.  . cholecalciferol (VITAMIN D) 1000 UNITS tablet Take 1,000 Units by mouth daily.  . fish oil-omega-3 fatty acids 1000 MG capsule Take 2 g by mouth daily.  . fluticasone (FLONASE) 50 MCG/ACT nasal spray Place 1 spray 2 (two) times daily as needed into both nostrils for allergies or rhinitis.  . Garlic 6767 MG CAPS Take by mouth.  . levocetirizine (XYZAL) 5 MG tablet Take 1 Tablet by mouth every evening - Do not take with desloratadine unless directed by physician  . levocetirizine (XYZAL) 5 MG tablet TAKE 1 TABLET IN THE EVENING  . [DISCONTINUED] amLODipine (NORVASC) 10 MG tablet TAKE 1 TABLET EVERY DAY  . [DISCONTINUED] rosuvastatin (CRESTOR) 10 MG tablet TAKE 1 TABLET EVERY DAY   No facility-administered encounter medications on file as of 08/04/2017.     Allergies (verified) Cephalexin; Pravastatin; Sulfa antibiotics; and Zetia [ezetimibe]   History: Past Medical History:  Diagnosis Date  . Allergy   . Cholesteatoma   . Essential hypertension, benign   . FH: TAH-BSO (total abdominal hysterectomy and bilateral salpingo-oophorectomy)   . Gout   . Hx of appendectomy   .  Hyperlipidemia   . Kidney disease   . Osteopenia   . Paget's disease   . Vitamin D deficiency    Past Surgical History:  Procedure Laterality Date  . ABDOMINAL HYSTERECTOMY    . APPENDECTOMY    . carotid endoectomy Left   . cataract Bilateral 02/27/14  . EYE SURGERY     catarat surgery bilaterally   Family History  Problem Relation Age of Onset  . Heart disease Mother   . Stroke Mother 65  . Heart disease Father   . Heart attack Father   . Hypertension Son   . Heart attack Paternal Aunt   . Heart disease Paternal Aunt    Social History   Socioeconomic History  . Marital status: Widowed    Spouse name: Not on file  . Number of children: 2  . Years of education: cosemtology certificate  . Highest education level: Some college, no degree  Occupational History  . 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 Financial resource strain: Not hard at all  . Food insecurity:    Worry: Never true    Inability: Never true  . Transportation needs:    Medical: No    Non-medical: No  Tobacco Use  . Smoking status: Former Smoker    Packs/day: 1.00    Years: 20.00    Pack years: 20.00    Types: Cigarettes    Last attempt to quit: 08/25/2009    Years since  quitting: 8.0  . Smokeless tobacco: Never Used  Substance and Sexual Activity  . Alcohol use: No  . Drug use: No  . Sexual activity: Not Currently  Lifestyle  . Physical activity:    Days per week: 5 days    Minutes per session: 120 min  . Stress: Not at all  Relationships  . Social connections:    Talks on phone: More than three times a week    Gets together: More than three times a week    Attends religious service: More than 4 times per year    Active member of club or organization: Yes    Attends meetings of clubs or organizations: More than 4 times per year    Relationship status: Widowed  Other Topics Concern  . Not on file  Social History Narrative  . Not on file     Tobacco Use No.  Clinical Intake:     Pain : No/denies pain        What is the last grade level you completed in school?: High school graduate and then cosmetology school  Interpreter Needed?: No  Information entered by :: Chong Sicilian, RN   Activities of Daily Living In your present state of health, do you have any difficulty performing the following activities: 08/04/2017  Hearing? N  Vision? N  Comment Dr Katy Fitch yearly. had cataract surgery  Difficulty concentrating or making decisions? N  Walking or climbing stairs? N  Dressing or bathing? N  Doing errands, shopping? N  Preparing Food and eating ? N  Using the Toilet? N  In the past six months, have you accidently leaked urine? N  Do you have problems with loss of bowel control? N  Managing your Medications? N  Managing your Finances? N  Housekeeping or managing your Housekeeping? N  Some recent data might be hidden     Immunizations and Health Maintenance Immunization History  Administered Date(s) Administered  . Influenza Whole 12/28/2009  . Influenza, High Dose Seasonal PF 03/02/2017  . Influenza,inj,Quad PF,6+ Mos 01/15/2014, 01/11/2015, 01/07/2016  . Pneumococcal Conjugate-13 08/31/2014  . Pneumococcal Polysaccharide-23 08/25/2012  . Tdap 09/04/2010  . Zoster 03/10/2013   There are no preventive care reminders to display for this patient.  Diet 2 meals and snacks if she's hungry [employer] biscuit for breakfast and then cooks supper  Exercise Current Exercise Habits: Home exercise routine;Structured exercise class(walks two miles dailly and does zumba and other structured classes four days a week), Type of exercise: walking, Time (Minutes): 60, Frequency (Times/Week): 5, Weekly Exercise (Minutes/Week): 300, Intensity: Moderate   Depression Screen PHQ 2/9 Scores 08/04/2017 03/11/2017 09/08/2016 07/29/2016 03/04/2016 02/21/2016 01/20/2016  PHQ - 2 Score 0 0 0 0 0 0 0     Fall Risk Fall Risk   08/04/2017 03/11/2017 09/08/2016 07/29/2016 03/04/2016  Falls in the past year? No No No No No    Safety Is the patient's home free of loose throw rugs in walkways, pet beds, electrical cords, etc?   yes      Grab bars in the bathroom? no      Walkin shower? no      Shower Seat? no      Handrails on the stairs?   yes      Adequate lighting?   yes  Patient Care Team: Timmothy Euler, MD as PCP - General (Family Medicine) Rexene Agent, MD as Attending Physician (Nephrology)   No hospitalizations, ER visits, or surgeries this past year.  Objective:    Today's Vitals   08/04/17 1138  BP: 131/68  Pulse: 77  Weight: 107 lb (48.5 kg)  Height: 5\' 2"  (1.575 m)   Body mass index is 19.57 kg/m.  Advanced Directives 08/04/2017 07/29/2016  Does Patient Have a Medical Advance Directive? No No  Would patient like information on creating a medical advance directive? Yes (MAU/Ambulatory/Procedural Areas - Information given) Yes (MAU/Ambulatory/Procedural Areas - Information given)    Hearing/Vision  normal or No deficits noted during visit.  Cognitive Function: MMSE - Mini Mental State Exam 08/04/2017 07/29/2016  Orientation to time 5 5  Orientation to Place 5 5  Registration 3 3  Attention/ Calculation 4 5  Recall 3 3  Language- name 2 objects 2 2  Language- repeat 1 1  Language- follow 3 step command 3 3  Language- read & follow direction 1 1  Write a sentence 1 1  Copy design 0 1  Total score 28 30       Normal Cognitive Function Screening: Yes      Assessment:   This is a routine wellness examination for Diane Carlson.    Plan:    Goals    . Decrease the likelihood of falling     Remove rugs and small obstacles such as baskets etc. from walkways.    . HDL > 40    . HDL > 40     Continue low-fat diet and Crestor as well has your fish oil capsules.       Keep f/u with Timmothy Euler, MD and any other specialty appointments you may have Continue current  medications Move carefully to avoid falls. Use assistive devices like a can or walker if needed. Aim for at least 150 minutes of moderate activity a week. This can be done with chair exercises if necessary. Read or work on puzzles daily Stay connected with friends and family  Review and return a signed, witnessed, and notarized copy of Advance Directives if given.  Health Maintenance: Tdap Vaccine recommended: no Zostavax (Shingles vaccine) recommended:no Prevnar or Pneumovax (pneumonia vaccines) recommended:no  Cancer Screenings: Lung: Low Dose CT Chest recommended if Age 64-80 years, 30 pack-year currently smoking OR have quit w/in 15years. Patient does not qualify. Colon cancer screening recommended: no Mammogram recommended:yes Pap Smear recommended: no  Additional Screenings Hepatitis C Screening recommended: no Dexa Scan recommended: no Diabetic Eye Exam recommended: not applicable  No orders of the defined types were placed in this encounter.   I have personally reviewed and noted the following in the patient's chart:   . Medical and social history . Use of alcohol, tobacco or illicit drugs  . Current medications and supplements . Functional ability and status . Nutritional status . Physical activity . Advanced directives . List of other physicians . Hospitalizations, surgeries, and ER visits in previous 12 months . Vitals . Screenings to include cognitive, depression, and falls . Referrals and appointments  In addition, I have reviewed and discussed with patient certain preventive protocols, quality metrics, and best practice recommendations. A written personalized care plan for preventive services as well as general preventive health recommendations were provided to patient.     Chong Sicilian, RN   08/04/17    I have reviewed and agree with the above AWV documentation.   Laroy Apple, MD [facility] 08/27/2017, 11:40 AM

## 2017-08-04 NOTE — Patient Instructions (Addendum)
  Diane Carlson,  Thank you for taking time to come for your Medicare Wellness Visit. I appreciate your ongoing commitment to your health goals. Please review the following plan we discussed and let me know if I can assist you in the future.   These are the goals we discussed: Goals    . Decrease the likelihood of falling     Remove rugs and small obstacles such as baskets etc. from walkways.    . HDL > 40    . HDL > 40     Continue low-fat diet and Crestor as well has your fish oil capsules.       This is a list of the screening recommended for you and due dates:  Health Maintenance  Topic Date Due  . Flu Shot  10/28/2017  . Mammogram  04/05/2018  . DEXA scan (bone density measurement)  09/09/2018  . Tetanus Vaccine  09/03/2020  . Colon Cancer Screening  02/26/2027  .  Hepatitis C: One time screening is recommended by Center for Disease Control  (CDC) for  adults born from [birth year] through 1964.   Completed  . Pneumonia vaccines  Completed   ?

## 2017-08-10 ENCOUNTER — Other Ambulatory Visit: Payer: Self-pay | Admitting: Family Medicine

## 2017-08-16 ENCOUNTER — Other Ambulatory Visit: Payer: Self-pay | Admitting: Family Medicine

## 2017-08-17 NOTE — Telephone Encounter (Signed)
Last lipid 03/11/17

## 2017-09-09 ENCOUNTER — Ambulatory Visit: Payer: Medicare PPO | Admitting: Family Medicine

## 2017-09-13 ENCOUNTER — Encounter: Payer: Self-pay | Admitting: Family Medicine

## 2017-09-13 ENCOUNTER — Ambulatory Visit: Payer: Medicare PPO | Admitting: Family Medicine

## 2017-09-13 VITALS — BP 132/72 | HR 87 | Temp 97.5°F | Ht 62.0 in | Wt 105.0 lb

## 2017-09-13 DIAGNOSIS — J0101 Acute recurrent maxillary sinusitis: Secondary | ICD-10-CM

## 2017-09-13 MED ORDER — AZITHROMYCIN 250 MG PO TABS: ORAL_TABLET | ORAL | 0 refills | Status: DC

## 2017-09-13 NOTE — Progress Notes (Signed)
BP 132/72   Pulse 87   Temp (!) 97.5 F (36.4 C) (Oral)   Ht 5\' 2"  (1.575 m)   Wt 105 lb (47.6 kg)   BMI 19.20 kg/m    Subjective:    Patient ID: Diane Carlson, female    DOB: [birth year], 72 y.o.   MRN: [id number]  HPI: Diane Carlson is a 72 y.o. female presenting on 09/13/2017 for Sinusitis (sinus congestion, cough x 3 days)   HPI Sinus congestion and cough Patient is coming in with sinus congestion and cough and headache that is been going on for the past 3 days.  She denies any fevers but says she is felt cold at times but not necessarily chills.  She denies any shortness of breath or wheezing.  She does have some sinus pressure under her eyes and postnasal drainage.  She has not really used anything more than her regiment that she normally takes which is eyes all.  She does have Flonase but admits that she has not used it or anything else to try and help with this.  Relevant past medical, surgical, family and social history reviewed and updated as indicated. Interim medical history since our last visit reviewed. Allergies and medications reviewed and updated.  Review of Systems  Constitutional: Negative for chills and fever.  HENT: Positive for congestion, postnasal drip, rhinorrhea, sinus pressure, sneezing and sore throat. Negative for ear discharge and ear pain.   Eyes: Negative for visual disturbance.  Respiratory: Positive for cough. Negative for chest tightness and shortness of breath.   Cardiovascular: Negative for chest pain and leg swelling.  Genitourinary: Negative for difficulty urinating and dysuria.  Musculoskeletal: Negative for back pain and gait problem.  Skin: Negative for rash.  Neurological: Negative for light-headedness and headaches.  Psychiatric/Behavioral: Negative for agitation and behavioral problems.  All other systems reviewed and are negative.   Per HPI unless specifically indicated above   Allergies as of 09/13/2017      Reactions   Cephalexin    Pravastatin    Muscle aches   Sulfa Antibiotics    Zetia [ezetimibe]    mylagias      Medication List        Accurate as of 09/13/17  8:09 AM. Always use your most recent med list.          amLODipine 10 MG tablet Commonly known as:  NORVASC TAKE 1 TABLET EVERY DAY   aspirin 81 MG tablet Take 81 mg by mouth daily.   azithromycin 250 MG tablet Commonly known as:  ZITHROMAX Take 2 the first day and then one each day after.   calcitRIOL 0.25 MCG capsule Commonly known as:  ROCALTROL Take 0.25 mcg by mouth daily.   cholecalciferol 1000 units tablet Commonly known as:  VITAMIN D Take 1,000 Units by mouth daily.   fish oil-omega-3 fatty acids 1000 MG capsule Take 2 g by mouth daily.   fluticasone 50 MCG/ACT nasal spray Commonly known as:  FLONASE Place 1 spray 2 (two) times daily as needed into both nostrils for allergies or rhinitis.   Garlic 2671 MG Caps Take by mouth.   levocetirizine 5 MG tablet Commonly known as:  XYZAL TAKE 1 TABLET IN THE EVENING   rosuvastatin 10 MG tablet Commonly known as:  CRESTOR TAKE 1 TABLET EVERY DAY          Objective:    BP 132/72   Pulse 87   Temp (!) 97.5 F (36.4 C) (  Oral)   Ht 5\' 2"  (1.575 m)   Wt 105 lb (47.6 kg)   BMI 19.20 kg/m   Wt Readings from Last 3 Encounters:  09/13/17 105 lb (47.6 kg)  08/04/17 107 lb (48.5 kg)  03/11/17 108 lb 6.4 oz (49.2 kg)    Physical Exam  Constitutional: She is oriented to person, place, and time. She appears well-developed and well-nourished. No distress.  HENT:  Right Ear: Tympanic membrane, external ear and ear canal normal.  Left Ear: Tympanic membrane, external ear and ear canal normal.  Nose: Mucosal edema and rhinorrhea present. No epistaxis. Right sinus exhibits no maxillary sinus tenderness and no frontal sinus tenderness. Left sinus exhibits no maxillary sinus tenderness and no frontal sinus tenderness.  Mouth/Throat: Uvula is midline and mucous  membranes are normal. Posterior oropharyngeal edema and posterior oropharyngeal erythema present. No oropharyngeal exudate or tonsillar abscesses.  Eyes: Conjunctivae and EOM are normal.  Neck: Neck supple. No thyromegaly present.  Cardiovascular: Normal rate, regular rhythm, normal heart sounds and intact distal pulses.  No murmur heard. Pulmonary/Chest: Effort normal and breath sounds normal. No respiratory distress. She has no wheezes.  Lymphadenopathy:    She has no cervical adenopathy.  Neurological: She is alert and oriented to person, place, and time. Coordination normal.  Skin: Skin is warm and dry. No rash noted. She is not diaphoretic.  Psychiatric: She has a normal mood and affect. Her behavior is normal.  Vitals reviewed.       Assessment & Plan:   Problem List Items Addressed This Visit    None    Visit Diagnoses    Acute recurrent maxillary sinusitis    -  Primary   Relevant Medications   azithromycin (ZITHROMAX) 250 MG tablet       Follow up plan: Return if symptoms worsen or fail to improve.  Counseling provided for all of the vaccine components No orders of the defined types were placed in this encounter.   Caryl Pina, MD [facility] 09/13/2017, 8:09 AM

## 2017-09-15 ENCOUNTER — Ambulatory Visit: Payer: Medicare PPO | Admitting: Family Medicine

## 2017-09-15 ENCOUNTER — Encounter: Payer: Self-pay | Admitting: Family Medicine

## 2017-09-15 VITALS — BP 125/69 | HR 88 | Temp 98.4°F | Ht 62.0 in | Wt 106.0 lb

## 2017-09-15 DIAGNOSIS — J40 Bronchitis, not specified as acute or chronic: Secondary | ICD-10-CM | POA: Diagnosis not present

## 2017-09-15 DIAGNOSIS — N184 Chronic kidney disease, stage 4 (severe): Secondary | ICD-10-CM | POA: Diagnosis not present

## 2017-09-15 DIAGNOSIS — I10 Essential (primary) hypertension: Secondary | ICD-10-CM | POA: Diagnosis not present

## 2017-09-15 DIAGNOSIS — E782 Mixed hyperlipidemia: Secondary | ICD-10-CM | POA: Diagnosis not present

## 2017-09-15 LAB — BASIC METABOLIC PANEL
BUN/Creatinine Ratio: 8 — ABNORMAL LOW (ref 12–28)
BUN: 15 mg/dL (ref 8–27)
CO2: 21 mmol/L (ref 20–29)
Calcium: 9.4 mg/dL (ref 8.7–10.3)
Chloride: 106 mmol/L (ref 96–106)
Creatinine, Ser: 1.92 mg/dL — ABNORMAL HIGH (ref 0.57–1.00)
GFR calc Af Amer: 30 mL/min/{1.73_m2} — ABNORMAL LOW (ref >59)
GFR calc non Af Amer: 26 mL/min/{1.73_m2} — ABNORMAL LOW (ref >59)
GLUCOSE: 107 mg/dL — AB (ref 65–99)
Potassium: 4.6 mmol/L (ref 3.5–5.2)
Sodium: 141 mmol/L (ref 134–144)

## 2017-09-15 MED ORDER — AMOXICILLIN 500 MG PO CAPS: 500.0000 mg | ORAL_CAPSULE | Freq: Two times a day (BID) | ORAL | 0 refills | Status: DC

## 2017-09-15 MED ORDER — BENZONATATE 100 MG PO CAPS: 100.0000 mg | ORAL_CAPSULE | Freq: Three times a day (TID) | ORAL | 0 refills | Status: DC | PRN

## 2017-09-15 MED ORDER — ROSUVASTATIN CALCIUM 10 MG PO TABS: 10.0000 mg | ORAL_TABLET | Freq: Every day | ORAL | 1 refills | Status: DC

## 2017-09-15 MED ORDER — METHYLPREDNISOLONE ACETATE 80 MG/ML IJ SUSP
40.0000 mg | Freq: Once | INTRAMUSCULAR | Status: AC
  Administered 2017-09-15: 40 mg via INTRAMUSCULAR

## 2017-09-15 MED ORDER — METHYLPREDNISOLONE ACETATE 40 MG/ML IJ SUSP: 40.0000 mg | Freq: Once | INTRAMUSCULAR | Status: DC

## 2017-09-15 MED ORDER — LEVOCETIRIZINE DIHYDROCHLORIDE 5 MG PO TABS: 5.0000 mg | ORAL_TABLET | Freq: Every evening | ORAL | 3 refills | Status: DC

## 2017-09-15 MED ORDER — AMLODIPINE BESYLATE 10 MG PO TABS: 10.0000 mg | ORAL_TABLET | Freq: Every day | ORAL | 1 refills | Status: DC

## 2017-09-15 NOTE — Patient Instructions (Signed)
See me in December for your physical.  Hold off on the Amoxicillin for a few days unless your symptoms get worse.  I have sent in a cough medication and you were given a steroid shot today.  - Get plenty of rest and drink plenty of fluids. - Try to breathe moist air. Use a cold mist humidifier. - Consume warm fluids (soup or tea) to provide relief for a stuffy nose and to loosen phlegm. - For nasal stuffiness, try saline nasal spray or a Neti Pot.  Afrin nasal spray can also be used but this product should not be used longer than 3 days or it will cause rebound nasal stuffiness (worsening nasal congestion). - For sore throat pain relief: use chloraseptic spray, suck on throat lozenges, hard candy or popsicles; gargle with warm salt water (1/4 tsp. salt per 8 oz. of water); and eat soft, bland foods. - Eat a well-balanced diet. If you cannot, ensure you are getting enough nutrients by taking a daily multivitamin. - Avoid dairy products, as they can thicken phlegm. - Avoid alcohol, as it impairs your body's immune system.  CONTACT YOUR DOCTOR IF YOU EXPERIENCE ANY OF THE FOLLOWING: - High fever - Ear pain - Sinus-type headache - Unusually severe cold symptoms - Cough that gets worse while other cold symptoms improve - Flare up of any chronic lung problem, such as asthma - Your symptoms persist longer than 2 weeks

## 2017-09-15 NOTE — Progress Notes (Signed)
Subjective: CC: HTN, HLD, CKD, URI PCP: Timmothy Euler, MD HCW:CBJS Diane Carlson is Diane 72 y.o. female presenting to clinic today for:  1. HTN Patient reports compliance with Norvasc 10 mg daily.  Denies chest pain, shortness of breath, dizziness, lower extremity edema  2.  Hyperlipidemia Patient reports compliance with Crestor 10 mg daily.  Denies any myalgia.  3.  CKD 4 Patient sees Dr. Joelyn Oms every 6 months for kidney check.  She is on vitamin D, calcitriol, Crestor.  She notes last visit was in December.  Urine output normal.  No concerns.  4.  URI Patient reports that she has had an ongoing productive cough since Saturday.  She notes associated hoarse voice.  Symptoms seem to be worse at night.  Denies any associated shortness of breath, fevers, nausea, vomiting, diarrhea or hemoptysis.  She has had chills at night.  She called the office in [place] Z-Pak was sent in but she did not started because she says that this typically does not help her.   ROS: Per HPI  Allergies  Allergen Reactions  . Cephalexin   . Pravastatin     Muscle aches  . Sulfa Antibiotics   . Zetia [Ezetimibe]     mylagias   Past Medical History:  Diagnosis Date  . Allergy   . Cholesteatoma   . Essential hypertension, benign   . FH: TAH-BSO (total abdominal hysterectomy and bilateral salpingo-oophorectomy)   . Gout   . Hx of appendectomy   . Hyperlipidemia   . Kidney disease   . Osteopenia   . Paget's disease   . Vitamin D deficiency     Current Outpatient Medications:  .  amLODipine (NORVASC) 10 MG tablet, TAKE 1 TABLET EVERY DAY, Disp: 90 tablet, Rfl: 1 .  aspirin 81 MG tablet, Take 81 mg by mouth daily.  , Disp: , Rfl:  .  azithromycin (ZITHROMAX) 250 MG tablet, Take 2 the first day and then one each day after., Disp: 6 tablet, Rfl: 0 .  cholecalciferol (VITAMIN D) 1000 UNITS tablet, Take 1,000 Units by mouth daily., Disp: , Rfl:  .  fish oil-omega-3 fatty acids 1000 MG capsule, Take 2 g by  mouth daily., Disp: , Rfl:  .  fluticasone (FLONASE) 50 MCG/ACT nasal spray, Place 1 spray 2 (two) times daily as needed into both nostrils for allergies or rhinitis., Disp: 16 g, Rfl: 6 .  Garlic 2831 MG CAPS, Take by mouth., Disp: , Rfl:  .  levocetirizine (XYZAL) 5 MG tablet, TAKE 1 TABLET IN THE EVENING, Disp: 30 tablet, Rfl: 2 .  rosuvastatin (CRESTOR) 10 MG tablet, TAKE 1 TABLET EVERY DAY, Disp: 90 tablet, Rfl: 1 .  calcitRIOL (ROCALTROL) 0.25 MCG capsule, Take 0.25 mcg by mouth daily., Disp: , Rfl:  Social History   Socioeconomic History  . Marital status: Widowed    Spouse name: Not on file  . Number of children: 2  . Years of education: cosemtology certificate  . Highest education level: Some college, no degree  Occupational History  . 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 Financial resource strain: Not hard at all  . Food insecurity:    Worry: Never true    Inability: Never true  . Transportation needs:    Medical: No    Non-medical: No  Tobacco Use  . Smoking status: Former Smoker    Packs/day: 1.00    Years: 20.00    Pack years: 20.00  Types: Cigarettes    Last attempt to quit: 08/25/2009    Years since quitting: 8.0  . Smokeless tobacco: Never Used  Substance and Sexual Activity  . Alcohol use: No  . Drug use: No  . Sexual activity: Not Currently  Lifestyle  . Physical activity:    Days per week: 5 days    Minutes per session: 120 min  . Stress: Not at all  Relationships  . Social connections:    Talks on phone: More than three times Diane week    Gets together: More than three times Diane week    Attends religious service: More than 4 times per year    Active member of club or organization: Yes    Attends meetings of clubs or organizations: More than 4 times per year    Relationship status: Widowed  . Intimate partner violence:    Fear of current or ex partner: Not on file    Emotionally abused: Not on file     Physically abused: Not on file    Forced sexual activity: Not on file  Other Topics Concern  . Not on file  Social History Narrative  . Not on file   Family History  Problem Relation Age of Onset  . Heart disease Mother   . Stroke Mother 7  . Heart disease Father   . Heart attack Father   . Hypertension Son   . Heart attack Paternal Aunt   . Heart disease Paternal Aunt     Objective: Office vital signs reviewed. BP 125/69   Pulse 88   Temp 98.4 F (36.9 C) (Oral)   Ht 5\' 2"  (1.575 m)   Wt 106 lb (48.1 kg)   SpO2 97%   BMI 19.39 kg/m   Physical Examination:  General: Awake, alert, elderly female, nontoxic, acute distress HEENT: Normal    Neck: No masses palpated. No lymphadenopathy    Ears: Tympanic membranes intact, normal light reflex, no erythema, no bulging    Eyes: PERRLA, extraocular membranes intact, sclera white    Nose: nasal turbinates moist, clear nasal discharge    Throat: moist mucus membranes, no erythema, no tonsillar exudate.  Airway is patent Cardio: regular rate and rhythm, S1S2 heard, no murmurs appreciated Pulm: clear to auscultation bilaterally, no wheezes, rhonchi or rales; normal work of breathing on room air  Assessment/ Plan: 72 y.o. female   1. Bronchitis Tessalon Perles, amoxicillin and Depo-Medrol given. Xyzal refilled.  Patient hold off on amoxicillin for the next couple of days unless she develops fevers or worsening symptoms.  Home care instructions were reviewed with the patient.  Reasons for return and emergent evaluation emergency department discussed.  Follow-up as needed - methylPREDNISolone acetate (DEPO-MEDROL) injection 40 mg  2. Essential hypertension, benign Blood pressure at goal.  Okay to continue Norvasc 10 mg daily.  This is been sent in.  She will follow-up in 6 months - Basic Metabolic Panel  3. Mixed hyperlipidemia Crestor refilled x6 months  4. CKD (chronic kidney disease) stage 4, GFR 15-29 ml/min (HCC) Check  BMP today.  Follow-up with Dr. Joelyn Oms as directed. - Basic Metabolic Panel   Orders Placed This Encounter  Procedures  . Basic Metabolic Panel   Meds ordered this encounter  Medications  . amLODipine (NORVASC) 10 MG tablet    Sig: Take 1 tablet (10 mg total) by mouth daily.    Dispense:  90 tablet    Refill:  1  . rosuvastatin (CRESTOR) 10 MG tablet  Sig: Take 1 tablet (10 mg total) by mouth daily.    Dispense:  90 tablet    Refill:  1  . amoxicillin (AMOXIL) 500 MG capsule    Sig: Take 1 capsule (500 mg total) by mouth 2 (two) times daily for 10 days.    Dispense:  20 capsule    Refill:  0  . DISCONTD: methylPREDNISolone acetate (DEPO-MEDROL) injection 40 mg  . benzonatate (TESSALON PERLES) 100 MG capsule    Sig: Take 1 capsule (100 mg total) by mouth 3 (three) times daily as needed for cough.    Dispense:  20 capsule    Refill:  0  . methylPREDNISolone acetate (DEPO-MEDROL) injection 40 mg  . levocetirizine (XYZAL) 5 MG tablet    Sig: Take 1 tablet (5 mg total) by mouth every evening.    Dispense:  90 tablet    Refill:  [place], [place] [phone]

## 2017-09-20 ENCOUNTER — Ambulatory Visit (INDEPENDENT_AMBULATORY_CARE_PROVIDER_SITE_OTHER): Payer: Medicare PPO

## 2017-09-20 ENCOUNTER — Ambulatory Visit: Payer: Medicare PPO | Admitting: Family Medicine

## 2017-09-20 ENCOUNTER — Telehealth: Payer: Self-pay | Admitting: Family Medicine

## 2017-09-20 ENCOUNTER — Encounter: Payer: Self-pay | Admitting: Family Medicine

## 2017-09-20 VITALS — BP 139/70 | HR 99 | Temp 97.5°F | Ht 62.0 in | Wt 105.0 lb

## 2017-09-20 DIAGNOSIS — J181 Lobar pneumonia, unspecified organism: Secondary | ICD-10-CM

## 2017-09-20 DIAGNOSIS — R0602 Shortness of breath: Secondary | ICD-10-CM | POA: Diagnosis not present

## 2017-09-20 DIAGNOSIS — R05 Cough: Secondary | ICD-10-CM | POA: Diagnosis not present

## 2017-09-20 DIAGNOSIS — J189 Pneumonia, unspecified organism: Secondary | ICD-10-CM

## 2017-09-20 DIAGNOSIS — R059 Cough, unspecified: Secondary | ICD-10-CM

## 2017-09-20 MED ORDER — AZITHROMYCIN 200 MG/5ML PO SUSR: ORAL | 0 refills | Status: DC

## 2017-09-20 NOTE — Progress Notes (Signed)
Subjective: CC: URI PCP: Timmothy Euler, MD ZHG:DJME [name] is a 72 y.o. female presenting to clinic today for:  1. URI Patient was seen on 6/17 and 6/19 for same.  At the 6/19 visit, she had noted ongoing productive cough since the preceding Saturday, hoarse voice that was worse at night.  She noted that the Z-Pak which was sent in 2 days prior she never picked up because the medication "typically did not work for her".  During our visit on 6/19, she was given Tessalon Perles, dose of Depo-Medrol IM and amoxicillin to use if symptoms were not improving.  Her symptoms did not appear to be bacterial during that visit.  Today she follows up and notes that she started taking the amoxicillin on Thursday but the cough has become worse and now she has associated shortness of breath over the last 2 days.  She describes the sputum as green and thick.  She has been using the cough medication with little improvement in symptoms.  Over the last 2 nights she also notes associated sweating.  No measured fevers.   ROS: Per HPI  Allergies  Allergen Reactions  . Cephalexin   . Pravastatin     Muscle aches  . Sulfa Antibiotics   . Zetia [Ezetimibe]     mylagias   Past Medical History:  Diagnosis Date  . Allergy   . Cholesteatoma   . Essential hypertension, benign   . FH: TAH-BSO (total abdominal hysterectomy and bilateral salpingo-oophorectomy)   . Gout   . Hx of appendectomy   . Hyperlipidemia   . Kidney disease   . Osteopenia   . Paget's disease   . Vitamin D deficiency     Current Outpatient Medications:  .  amLODipine (NORVASC) 10 MG tablet, Take 1 tablet (10 mg total) by mouth daily., Disp: 90 tablet, Rfl: 1 .  amoxicillin (AMOXIL) 500 MG capsule, Take 1 capsule (500 mg total) by mouth 2 (two) times daily for 10 days., Disp: 20 capsule, Rfl: 0 .  aspirin 81 MG tablet, Take 81 mg by mouth daily.  , Disp: , Rfl:  .  benzonatate (TESSALON PERLES) 100 MG capsule, Take 1 capsule (100  mg total) by mouth 3 (three) times daily as needed for cough., Disp: 20 capsule, Rfl: 0 .  calcitRIOL (ROCALTROL) 0.25 MCG capsule, Take 0.25 mcg by mouth daily., Disp: , Rfl:  .  cholecalciferol (VITAMIN D) 1000 UNITS tablet, Take 1,000 Units by mouth daily., Disp: , Rfl:  .  fish oil-omega-3 fatty acids 1000 MG capsule, Take 2 g by mouth daily., Disp: , Rfl:  .  fluticasone (FLONASE) 50 MCG/ACT nasal spray, Place 1 spray 2 (two) times daily as needed into both nostrils for allergies or rhinitis., Disp: 16 g, Rfl: 6 .  Garlic 2683 MG CAPS, Take by mouth., Disp: , Rfl:  .  levocetirizine (XYZAL) 5 MG tablet, Take 1 tablet (5 mg total) by mouth every evening., Disp: 90 tablet, Rfl: 3 .  rosuvastatin (CRESTOR) 10 MG tablet, Take 1 tablet (10 mg total) by mouth daily., Disp: 90 tablet, Rfl: 1 Social History   Socioeconomic History  . Marital status: Widowed    Spouse name: Not on file  . Number of children: 2  . Years of education: cosemtology certificate  . Highest education level: Some college, no degree  Occupational History  . 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 Financial resource strain: Not hard  at all  . Food insecurity:    Worry: Never true    Inability: Never true  . Transportation needs:    Medical: No    Non-medical: No  Tobacco Use  . Smoking status: Former Smoker    Packs/day: 1.00    Years: 20.00    Pack years: 20.00    Types: Cigarettes    Last attempt to quit: 08/25/2009    Years since quitting: 8.0  . Smokeless tobacco: Never Used  Substance and Sexual Activity  . Alcohol use: No  . Drug use: No  . Sexual activity: Not Currently  Lifestyle  . Physical activity:    Days per week: 5 days    Minutes per session: 120 min  . Stress: Not at all  Relationships  . Social connections:    Talks on phone: More than three times a week    Gets together: More than three times a week    Attends religious service: More than 4  times per year    Active member of club or organization: Yes    Attends meetings of clubs or organizations: More than 4 times per year    Relationship status: Widowed  . Intimate partner violence:    Fear of current or ex partner: Not on file    Emotionally abused: Not on file    Physically abused: Not on file    Forced sexual activity: Not on file  Other Topics Concern  . Not on file  Social History Narrative  . Not on file   Family History  Problem Relation Age of Onset  . Heart disease Mother   . Stroke Mother 38  . Heart disease Father   . Heart attack Father   . Hypertension Son   . Heart attack Paternal Aunt   . Heart disease Paternal Aunt     Objective: Office vital signs reviewed. BP 139/70   Pulse 99   Temp (!) 97.5 F (36.4 C) (Oral)   Ht 5\' 2"  (1.575 m)   Wt 105 lb (47.6 kg)   SpO2 95%   BMI 19.20 kg/m   Physical Examination:  General: Awake, alert, elderly female, nontoxic but appears tired, No acute distress HEENT: Normal    Neck: No masses palpated. No lymphadenopathy    Eyes: PERRLA, extraocular membranes intact, sclera white    Nose: nasal turbinates moist, no nasal discharge    Throat: moist mucus membranes, no erythema Cardio: regular rate and rhythm, S1S2 heard, no murmurs appreciated Pulm: globally decreased breath sounds. no wheezes, rhonchi or rales; normal work of breathing on room air Extremities: warm, well perfused, No edema, cyanosis or clubbing; +2 pulses bilaterally MSK: normal gait and normal station  Dg Chest 2 View  Result Date: 09/20/2017 CLINICAL DATA:  Cough, shortness of breath. EXAM: CHEST - 2 VIEW COMPARISON:  Radiographs of January 20, 2016. FINDINGS: The heart size and mediastinal contours are within normal limits. No pneumothorax or pleural effusion is noted. Stable biapical scarring is noted. No significant abnormality seen in the right lung. Increased lingular opacity is noted concerning for pneumonia or atelectasis. The  visualized skeletal structures are unremarkable. IMPRESSION: Increased left lingular opacity is noted concerning for pneumonia or atelectasis. Electronically Signed   By: Marijo Conception, M.D.   On: 09/20/2017 09:49    Assessment/ Plan: 72 y.o. female   1. Community acquired pneumonia of left lower lobe of lung ([place]) Patient with normal work of breathing and oxygen saturation on room  air.  She is now however having night sweats and shortness of breath over the last 2 days.  Concern now for progression to bacterial infection.  X-ray was obtained which upon personal review demonstrated what appears to be increased opacity in the left lower lobe.  Radiologist notes that this is a left lingular opacity that is concerning for pneumonia.  For this reason, I have instructed patient to discontinue the amoxicillin.  We discussed that azithromycin is the appropriate treatment for pneumonia.  Patient is amenable to taking this but cites that she needs it as a liquid because she has difficulty swallowing pills.  500 mg p.o. daily x1 day then take to 50 mg p.o. daily days 2 through 4.  Instructions were reviewed with the patient.  Reasons for emergent evaluation the emergency department discussed.  She voiced good understanding.  2. Cough - DG Chest 2 View; Future  3. Shortness of breath - DG Chest 2 View; Future    Orders Placed This Encounter  Procedures  . DG Chest 2 View    Standing Status:   Future    Standing Expiration Date:   11/21/2018    Order Specific Question:   Reason for Exam (SYMPTOM  OR DIAGNOSIS REQUIRED)    Answer:   worsening cough now w/ SOB x2 days    Order Specific Question:   Preferred imaging location?    Answer:   Internal    Order Specific Question:   Radiology Contrast Protocol - do NOT remove file path    Answer:   [email or web address]   Meds ordered this encounter  Medications  . azithromycin (ZITHROMAX) 200 MG/5ML suspension     Sig: Take 12.5 mLs (500 mg total) by mouth daily for 1 day, THEN 6.3 mLs (250 mg total) daily for 4 days.    Dispense:  22.5 mL    Refill:  0     Diane Molock Windell Moulding, DO [place] [phone]

## 2017-09-20 NOTE — Patient Instructions (Addendum)
Please schedule your annual wellness visit at checkout.   Community-Acquired Pneumonia, Adult Pneumonia is an infection of the lungs. One type of pneumonia can happen while a person is in a hospital. A different type can happen when a person is not in a hospital (community-acquired pneumonia). It is easy for this kind to spread from person to person. It can spread to you if you breathe near an infected person who coughs or sneezes. Some symptoms include:  A dry cough.  A wet (productive) cough.  Fever.  Sweating.  Chest pain.  Follow these instructions at home:  Take over-the-counter and prescription medicines only as told by your doctor. ? Only take cough medicine if you are losing sleep. ? If you were prescribed an antibiotic medicine, take it as told by your doctor. Do not stop taking the antibiotic even if you start to feel better.  Sleep with your head and neck raised (elevated). You can do this by putting a few pillows under your head, or you can sleep in a recliner.  Do not use tobacco products. These include cigarettes, chewing tobacco, and e-cigarettes. If you need help quitting, ask your doctor.  Drink enough water to keep your pee (urine) clear or pale yellow. A shot (vaccine) can help prevent pneumonia. Shots are often suggested for:  People older than 71 years of age.  People older than 71 years of age: ? Who are having cancer treatment. ? Who have long-term (chronic) lung disease. ? Who have problems with their body's defense system (immune system).  You may also prevent pneumonia if you take these actions:  Get the flu (influenza) shot every year.  Go to the dentist as often as told.  Wash your hands often. If soap and water are not available, use hand sanitizer.  Contact a doctor if:  You have a fever.  You lose sleep because your cough medicine does not help. Get help right away if:  You are short of breath and it gets worse.  You have more chest  pain.  Your sickness gets worse. This is very serious if: ? You are an older adult. ? Your body's defense system is weak.  You cough up blood. This information is not intended to replace advice given to you by your health care provider. Make sure you discuss any questions you have with your health care provider. Document Released: 09/02/2007 Document Revised: 08/22/2015 Document Reviewed: 07/11/2014 Elsevier Interactive Patient Education  [employer].

## 2017-09-20 NOTE — Telephone Encounter (Signed)
Pt aware.

## 2017-09-20 NOTE — Telephone Encounter (Signed)
Yes, she may.

## 2017-09-22 ENCOUNTER — Telehealth: Payer: Self-pay | Admitting: Family Medicine

## 2017-09-22 NOTE — Telephone Encounter (Signed)
Dr Lajuana Ripple saw her two days ago, so the medication has just been started. Can we pass this on to her and see if there is anything else recommended?

## 2017-09-22 NOTE — Telephone Encounter (Signed)
Aware of provider's advise.

## 2017-09-22 NOTE — Telephone Encounter (Signed)
Would recommend reevaluation if she does not feel like she is getting better.  Would recommend repeating CXR.  May need to escalate antibiotics.  However, she has not completed current antibiotics, so I do not expect her to feel 100% back to baseline at this point.

## 2017-09-25 ENCOUNTER — Emergency Department (HOSPITAL_COMMUNITY): Payer: Medicare PPO

## 2017-09-25 ENCOUNTER — Inpatient Hospital Stay (HOSPITAL_COMMUNITY)
Admission: EM | Admit: 2017-09-25 | Discharge: 2017-09-27 | DRG: 194 | Disposition: A | Discharge status: Home / Self Care | Payer: Medicare PPO | Attending: Internal Medicine | Admitting: Internal Medicine

## 2017-09-25 ENCOUNTER — Encounter (HOSPITAL_COMMUNITY): Payer: Self-pay | Admitting: Emergency Medicine

## 2017-09-25 ENCOUNTER — Other Ambulatory Visit: Payer: Self-pay

## 2017-09-25 DIAGNOSIS — Z789 Other specified health status: Secondary | ICD-10-CM | POA: Diagnosis not present

## 2017-09-25 DIAGNOSIS — D631 Anemia in chronic kidney disease: Secondary | ICD-10-CM | POA: Diagnosis present

## 2017-09-25 DIAGNOSIS — R918 Other nonspecific abnormal finding of lung field: Secondary | ICD-10-CM | POA: Diagnosis not present

## 2017-09-25 DIAGNOSIS — J441 Chronic obstructive pulmonary disease with (acute) exacerbation: Secondary | ICD-10-CM | POA: Diagnosis not present

## 2017-09-25 DIAGNOSIS — M109 Gout, unspecified: Secondary | ICD-10-CM | POA: Diagnosis not present

## 2017-09-25 DIAGNOSIS — J189 Pneumonia, unspecified organism: Secondary | ICD-10-CM | POA: Diagnosis not present

## 2017-09-25 DIAGNOSIS — I129 Hypertensive chronic kidney disease with stage 1 through stage 4 chronic kidney disease, or unspecified chronic kidney disease: Secondary | ICD-10-CM | POA: Diagnosis present

## 2017-09-25 DIAGNOSIS — N179 Acute kidney failure, unspecified: Secondary | ICD-10-CM | POA: Diagnosis not present

## 2017-09-25 DIAGNOSIS — Z7952 Long term (current) use of systemic steroids: Secondary | ICD-10-CM | POA: Diagnosis not present

## 2017-09-25 DIAGNOSIS — R0603 Acute respiratory distress: Secondary | ICD-10-CM | POA: Diagnosis not present

## 2017-09-25 DIAGNOSIS — R058 Other specified cough: Secondary | ICD-10-CM | POA: Diagnosis present

## 2017-09-25 DIAGNOSIS — D72829 Elevated white blood cell count, unspecified: Secondary | ICD-10-CM | POA: Diagnosis present

## 2017-09-25 DIAGNOSIS — R05 Cough: Secondary | ICD-10-CM | POA: Diagnosis present

## 2017-09-25 DIAGNOSIS — D638 Anemia in other chronic diseases classified elsewhere: Secondary | ICD-10-CM | POA: Diagnosis not present

## 2017-09-25 DIAGNOSIS — R0602 Shortness of breath: Secondary | ICD-10-CM | POA: Diagnosis not present

## 2017-09-25 DIAGNOSIS — E860 Dehydration: Secondary | ICD-10-CM | POA: Diagnosis present

## 2017-09-25 DIAGNOSIS — R0609 Other forms of dyspnea: Secondary | ICD-10-CM | POA: Diagnosis not present

## 2017-09-25 DIAGNOSIS — D899 Disorder involving the immune mechanism, unspecified: Secondary | ICD-10-CM | POA: Diagnosis present

## 2017-09-25 DIAGNOSIS — J440 Chronic obstructive pulmonary disease with acute lower respiratory infection: Secondary | ICD-10-CM | POA: Diagnosis not present

## 2017-09-25 DIAGNOSIS — R0600 Dyspnea, unspecified: Secondary | ICD-10-CM | POA: Diagnosis not present

## 2017-09-25 DIAGNOSIS — Z90710 Acquired absence of both cervix and uterus: Secondary | ICD-10-CM

## 2017-09-25 DIAGNOSIS — Z7982 Long term (current) use of aspirin: Secondary | ICD-10-CM

## 2017-09-25 DIAGNOSIS — N184 Chronic kidney disease, stage 4 (severe): Secondary | ICD-10-CM | POA: Diagnosis not present

## 2017-09-25 DIAGNOSIS — N189 Chronic kidney disease, unspecified: Secondary | ICD-10-CM | POA: Diagnosis present

## 2017-09-25 DIAGNOSIS — J181 Lobar pneumonia, unspecified organism: Secondary | ICD-10-CM | POA: Diagnosis not present

## 2017-09-25 DIAGNOSIS — R739 Hyperglycemia, unspecified: Secondary | ICD-10-CM | POA: Diagnosis present

## 2017-09-25 DIAGNOSIS — Z8249 Family history of ischemic heart disease and other diseases of the circulatory system: Secondary | ICD-10-CM | POA: Diagnosis not present

## 2017-09-25 DIAGNOSIS — Z87891 Personal history of nicotine dependence: Secondary | ICD-10-CM | POA: Diagnosis not present

## 2017-09-25 DIAGNOSIS — I10 Essential (primary) hypertension: Secondary | ICD-10-CM | POA: Diagnosis present

## 2017-09-25 HISTORY — DX: Pneumonia, unspecified organism: J18.9

## 2017-09-25 LAB — TROPONIN I: Troponin I: <0.03 ng/mL (ref <0.03)

## 2017-09-25 LAB — CBC WITH DIFFERENTIAL/PLATELET
BASOS ABS: 0 10*3/uL (ref 0.0–0.1)
Basophils Relative: 0 %
EOS PCT: 2 %
Eosinophils Absolute: 0.2 10*3/uL (ref 0.0–0.7)
HEMATOCRIT: 34.4 % — AB (ref 36.0–46.0)
Hemoglobin: 11 g/dL — ABNORMAL LOW (ref 12.0–15.0)
LYMPHS PCT: 9 %
Lymphs Abs: 1 10*3/uL (ref 0.7–4.0)
MCH: 28.9 pg (ref 26.0–34.0)
MCHC: 32 g/dL (ref 30.0–36.0)
MCV: 90.5 fL (ref 78.0–100.0)
MONO ABS: 0.7 10*3/uL (ref 0.1–1.0)
MONOS PCT: 6 %
Neutro Abs: 9.5 10*3/uL — ABNORMAL HIGH (ref 1.7–7.7)
Neutrophils Relative %: 83 %
Platelets: 495 10*3/uL — ABNORMAL HIGH (ref 150–400)
RBC: 3.8 MIL/uL — ABNORMAL LOW (ref 3.87–5.11)
RDW: 13 % (ref 11.5–15.5)
WBC: 11.4 10*3/uL — ABNORMAL HIGH (ref 4.0–10.5)

## 2017-09-25 LAB — BASIC METABOLIC PANEL
Anion gap: 11 (ref 5–15)
BUN: 29 mg/dL — AB (ref 8–23)
CO2: 23 mmol/L (ref 22–32)
Calcium: 8.6 mg/dL — ABNORMAL LOW (ref 8.9–10.3)
Chloride: 104 mmol/L (ref 98–111)
Creatinine, Ser: 2.22 mg/dL — ABNORMAL HIGH (ref 0.44–1.00)
GFR calc Af Amer: 25 mL/min — ABNORMAL LOW (ref >60)
GFR, EST NON AFRICAN AMERICAN: 21 mL/min — AB (ref >60)
GLUCOSE: 173 mg/dL — AB (ref 70–99)
Potassium: 3.9 mmol/L (ref 3.5–5.1)
Sodium: 138 mmol/L (ref 135–145)

## 2017-09-25 LAB — BRAIN NATRIURETIC PEPTIDE: B Natriuretic Peptide: 115 pg/mL — ABNORMAL HIGH (ref 0.0–100.0)

## 2017-09-25 MED ORDER — ASPIRIN 81 MG PO TABS
81.0000 mg | ORAL_TABLET | Freq: Every day | ORAL | Status: DC
  Filled 2017-09-25 (×3): qty 1

## 2017-09-25 MED ORDER — SODIUM CHLORIDE 0.9 % IV BOLUS
500.0000 mL | Freq: Once | INTRAVENOUS | Status: AC
  Administered 2017-09-25: 500 mL via INTRAVENOUS

## 2017-09-25 MED ORDER — IPRATROPIUM-ALBUTEROL 0.5-2.5 (3) MG/3ML IN SOLN
3.0000 mL | Freq: Three times a day (TID) | RESPIRATORY_TRACT | Status: DC
  Administered 2017-09-26 (×4): 3 mL via RESPIRATORY_TRACT
  Filled 2017-09-25 (×5): qty 3

## 2017-09-25 MED ORDER — LEVOCETIRIZINE DIHYDROCHLORIDE 5 MG PO TABS: 5.0000 mg | ORAL_TABLET | Freq: Every evening | ORAL | Status: DC

## 2017-09-25 MED ORDER — ROSUVASTATIN CALCIUM 10 MG PO TABS
10.0000 mg | ORAL_TABLET | Freq: Every day | ORAL | Status: DC
  Administered 2017-09-25: 10 mg via ORAL
  Filled 2017-09-25 (×2): qty 1

## 2017-09-25 MED ORDER — ENOXAPARIN SODIUM 30 MG/0.3ML ~~LOC~~ SOLN
30.0000 mg | SUBCUTANEOUS | Status: DC
  Administered 2017-09-25 – 2017-09-26 (×2): 30 mg via SUBCUTANEOUS
  Filled 2017-09-25 (×2): qty 0.3

## 2017-09-25 MED ORDER — LEVOFLOXACIN IN D5W 500 MG/100ML IV SOLN
500.0000 mg | INTRAVENOUS | Status: DC
  Administered 2017-09-27: 500 mg via INTRAVENOUS
  Filled 2017-09-25: qty 100

## 2017-09-25 MED ORDER — METHYLPREDNISOLONE SODIUM SUCC 125 MG IJ SOLR
80.0000 mg | Freq: Once | INTRAMUSCULAR | Status: AC
  Administered 2017-09-25: 80 mg via INTRAVENOUS
  Filled 2017-09-25: qty 2

## 2017-09-25 MED ORDER — BENZONATATE 100 MG PO CAPS: 100.0000 mg | ORAL_CAPSULE | Freq: Three times a day (TID) | ORAL | Status: DC | PRN

## 2017-09-25 MED ORDER — SODIUM CHLORIDE 0.9 % IV SOLN
INTRAVENOUS | Status: DC
  Administered 2017-09-25 – 2017-09-26 (×2): via INTRAVENOUS

## 2017-09-25 MED ORDER — LEVOFLOXACIN IN D5W 750 MG/150ML IV SOLN
750.0000 mg | Freq: Once | INTRAVENOUS | Status: AC
  Administered 2017-09-25: 750 mg via INTRAVENOUS
  Filled 2017-09-25: qty 150

## 2017-09-25 MED ORDER — AMLODIPINE BESYLATE 5 MG PO TABS
10.0000 mg | ORAL_TABLET | Freq: Every day | ORAL | Status: DC
  Administered 2017-09-26 – 2017-09-27 (×2): 10 mg via ORAL
  Filled 2017-09-25 (×3): qty 2

## 2017-09-25 MED ORDER — TRAZODONE HCL 50 MG PO TABS
50.0000 mg | ORAL_TABLET | Freq: Every evening | ORAL | Status: DC | PRN
  Administered 2017-09-26: 50 mg via ORAL
  Filled 2017-09-25: qty 1

## 2017-09-25 MED ORDER — LORATADINE 10 MG PO TABS
10.0000 mg | ORAL_TABLET | Freq: Every day | ORAL | Status: DC
  Administered 2017-09-25 – 2017-09-27 (×3): 10 mg via ORAL
  Filled 2017-09-25 (×3): qty 1

## 2017-09-25 MED ORDER — ASPIRIN EC 81 MG PO TBEC
81.0000 mg | DELAYED_RELEASE_TABLET | Freq: Every day | ORAL | Status: DC
  Administered 2017-09-25 – 2017-09-27 (×3): 81 mg via ORAL
  Filled 2017-09-25 (×3): qty 1

## 2017-09-25 MED ORDER — FLUTICASONE PROPIONATE 50 MCG/ACT NA SUSP: 1.0000 | Freq: Two times a day (BID) | NASAL | Status: DC | PRN

## 2017-09-25 NOTE — ED Provider Notes (Signed)
[facility] Provider Note   CSN: [id number] Arrival date & time: 09/25/17  1123     History   Chief Complaint Chief Complaint  Patient presents with  . Shortness of Breath    HPI Diane Carlson is a 72 y.o. female.  Patient presents with worsening shortness of breath and cough for the past week despite being on azithromycin for pneumonia which was diagnosed on x-ray.  Patient feels she can only walk for 5 steps without getting shortness of breath.  No history of cardiac problems or blood clot history.  No recent surgeries, no leg swelling.  No chest pain.  No fevers or chills.  Symptoms intermittent.     Past Medical History:  Diagnosis Date  . Allergy   . Cholesteatoma   . Essential hypertension, benign   . FH: TAH-BSO (total abdominal hysterectomy and bilateral salpingo-oophorectomy)   . Gout   . Hx of appendectomy   . Hyperlipidemia   . Kidney disease   . Osteopenia   . Paget's disease   . Pneumonia   . Vitamin D deficiency     Patient Active Problem List   Diagnosis Date Noted  . Acute respiratory distress 09/25/2017  . Atypical pneumonia 09/25/2017  . Failure of outpatient treatment 09/25/2017  . Renal failure (ARF), acute on chronic (HCC) 09/25/2017  . Dehydration 09/25/2017  . Dyspnea on exertion 09/25/2017  . Productive cough 09/25/2017  . Leukocytosis 09/25/2017  . Hyperglycemia 09/25/2017  . Vitamin D deficiency   . Seasonal allergic rhinitis 11/29/2012  . Anemia of chronic disease 08/25/2012  . FH: TAH-BSO (total abdominal hysterectomy and bilateral salpingo-oophorectomy)   . Paget disease of bone   . Osteopenia   . Essential hypertension, benign   . Hyperlipidemia   . CKD (chronic kidney disease) stage 4, GFR 15-29 ml/min (HCC)   . Gout   . Cholesteatoma     Past Surgical History:  Procedure Laterality Date  . ABDOMINAL HYSTERECTOMY    . APPENDECTOMY    . carotid endoectomy Left   . cataract Bilateral 02/27/14  . EYE  SURGERY     catarat surgery bilaterally     OB History   None      Home Medications    Prior to Admission medications   Medication Sig Start Date End Date Taking? Authorizing Provider  amLODipine (NORVASC) 10 MG tablet Take 1 tablet (10 mg total) by mouth daily. 09/15/17  Yes Ronnie Doss M, DO  aspirin 81 MG tablet Take 81 mg by mouth daily.     Yes [provider]  benzonatate (TESSALON PERLES) 100 MG capsule Take 1 capsule (100 mg total) by mouth 3 (three) times daily as needed for cough. 09/15/17  Yes Ronnie Doss M, DO  cholecalciferol (VITAMIN D) 1000 UNITS tablet Take 1,000 Units by mouth daily.   Yes [provider]  fish oil-omega-3 fatty acids 1000 MG capsule Take 2 g by mouth daily.   Yes [provider]  fluticasone (FLONASE) 50 MCG/ACT nasal spray Place 1 spray 2 (two) times daily as needed into both nostrils for allergies or rhinitis. 02/05/17  Yes Dettinger, Fransisca Kaufmann, MD  Garlic 6812 MG CAPS Take 1 capsule by mouth 2 (two) times daily.    Yes [provider]  levocetirizine (XYZAL) 5 MG tablet Take 1 tablet (5 mg total) by mouth every evening. 09/15/17  Yes Gottschalk, Leatrice Jewels M, DO  rosuvastatin (CRESTOR) 10 MG tablet Take 1 tablet (10 mg total)  by mouth daily. 09/15/17  Yes [name], DO    Family History Family History  Problem Relation Age of Onset  . Heart disease Mother   . Stroke Mother 28  . Heart disease Father   . Heart attack Father   . Hypertension Son   . Heart attack Paternal Aunt   . Heart disease Paternal Aunt     Social History Social History   Tobacco Use  . Smoking status: Former Smoker    Packs/day: 1.00    Years: 20.00    Pack years: 20.00    Types: Cigarettes    Last attempt to quit: 08/25/2009    Years since quitting: 8.0  . Smokeless tobacco: Never Used  Substance Use Topics  . Alcohol use: No  . Drug use: No     Allergies   Cephalexin; Pravastatin; Sulfa antibiotics; and  Zetia [ezetimibe]   Review of Systems Review of Systems  Constitutional: Negative for chills and fever.  HENT: Negative for congestion.   Eyes: Negative for visual disturbance.  Respiratory: Positive for cough and shortness of breath. Negative for choking.   Cardiovascular: Negative for chest pain.  Gastrointestinal: Negative for abdominal pain and vomiting.  Genitourinary: Negative for dysuria and flank pain.  Musculoskeletal: Negative for back pain, neck pain and neck stiffness.  Skin: Negative for rash.  Neurological: Negative for light-headedness and headaches.     Physical Exam Updated Vital Signs BP 133/63   Pulse 88   Temp 97.7 F (36.5 C) (Oral)   Resp (!) 21   Ht 5\' 2"  (1.575 m)   Wt 47.6 kg (105 lb)   SpO2 99%   BMI 19.20 kg/m   Physical Exam  Constitutional: She is oriented to person, place, and time. She appears well-developed and well-nourished.  HENT:  Head: Normocephalic and atraumatic.  Eyes: Conjunctivae are normal. Right eye exhibits no discharge. Left eye exhibits no discharge.  Neck: Normal range of motion. Neck supple. No tracheal deviation present.  Cardiovascular: Normal rate and regular rhythm.  Pulmonary/Chest: Effort normal. She has rales.  Abdominal: Soft. She exhibits no distension. There is no tenderness. There is no guarding.  Musculoskeletal: She exhibits no edema.  Neurological: She is alert and oriented to person, place, and time.  Skin: Skin is warm. No rash noted.  Psychiatric: She has a normal mood and affect.  Nursing note and vitals reviewed.    ED Treatments / Results  Labs (all labs ordered are listed, but only abnormal results are displayed) Labs Reviewed  BASIC METABOLIC PANEL - Abnormal; Notable for the following components:      Result Value   Glucose, Bld 173 (*)    BUN 29 (*)    Creatinine, Ser 2.22 (*)    Calcium 8.6 (*)    GFR calc non Af Amer 21 (*)    GFR calc Af Amer 25 (*)    All other components within  normal limits  CBC WITH DIFFERENTIAL/PLATELET - Abnormal; Notable for the following components:   WBC 11.4 (*)    RBC 3.80 (*)    Hemoglobin 11.0 (*)    HCT 34.4 (*)    Platelets 495 (*)    Neutro Abs 9.5 (*)    All other components within normal limits  BRAIN NATRIURETIC PEPTIDE - Abnormal; Notable for the following components:   B Natriuretic Peptide 115.0 (*)    All other components within normal limits  TROPONIN I    EKG EKG Interpretation  Date/Time:  Saturday September 25 2017 11:34:51 EDT Ventricular Rate:  101 PR Interval:    QRS Duration: 92 QT Interval:  348 QTC Calculation: 452 R Axis:   55 Text Interpretation:  Sinus tachycardia Confirmed by Elnora Morrison [phone]) on 09/25/2017 12:29:15 PM   Radiology Dg Chest 2 View  Result Date: 09/25/2017 CLINICAL DATA:  Shortness of breath.  Recent diagnosis of pneumonia. EXAM: CHEST - 2 VIEW COMPARISON:  01/20/2016 and 09/20/2017 FINDINGS: Stable postsurgical changes at the left cervicothoracic junction. Cardiomediastinal silhouette is normal. Mediastinal contours appear intact. There is no evidence of pneumothorax. Bilateral upper lobe predominant emphysematous changes and peribronchial thickening. Patchy bilateral airspace consolidation in the lingula and right middle lobe is slightly more prominent. Osseous structures are without acute abnormality. Soft tissues are grossly normal. IMPRESSION: Patchy bilateral airspace consolidation in the lingula and right middle lobe, may represent atypical pneumonia. Underlying emphysematous changes and peribronchial thickening. Electronically Signed   By: Fidela Salisbury M.D.   On: 09/25/2017 13:34    Procedures Procedures (including critical care time)  Medications Ordered in ED Medications  levofloxacin (LEVAQUIN) IVPB 750 mg (750 mg Intravenous New Bag/Given 09/25/17 1418)  levofloxacin (LEVAQUIN) IVPB 500 mg (has no administration in time range)  sodium chloride 0.9 % bolus 500 mL (0  mLs Intravenous Stopped 09/25/17 1454)     Initial Impression / Assessment and Plan / ED Course  I have reviewed the triage vital signs and the nursing notes.  Pertinent labs & imaging results that were available during my care of the patient were reviewed by me and considered in my medical decision making (see chart for details).    Patient presents with worsening shortness of breath and cough with recent pneumonia.  On exam patient has mild tachypnea and more concerning is her exertional shortness of breath.  Plan for cardiac work-up in addition to assessing for worsening pneumonia with failed outpatient oral antibiotics.  Discussed with hospitalist after reviewing x-ray showing worsening pneumonia who is comfortable admitting the patient.  Labs mild leukocytosis.  The patients results and plan were reviewed and discussed.   Any x-rays performed were independently reviewed by myself.   Differential diagnosis were considered with the presenting HPI.  Medications  levofloxacin (LEVAQUIN) IVPB 750 mg (750 mg Intravenous New Bag/Given 09/25/17 1418)  levofloxacin (LEVAQUIN) IVPB 500 mg (has no administration in time range)  sodium chloride 0.9 % bolus 500 mL (0 mLs Intravenous Stopped 09/25/17 1454)    Vitals:   09/25/17 1132 09/25/17 1134 09/25/17 1300  BP:  (!) 148/65 133/63  Pulse:  (!) 101 88  Resp:  19 (!) 21  Temp:  97.7 F (36.5 C)   TempSrc:  Oral   SpO2:  99% 99%  Weight: 47.6 kg (105 lb)    Height: 5\' 2"  (1.575 m)      Final diagnoses:  Community acquired pneumonia, unspecified laterality    Admission/ observation were discussed with the admitting physician, patient and/or family and they are comfortable with the plan.   Final Clinical Impressions(s) / ED Diagnoses   Final diagnoses:  Community acquired pneumonia, unspecified laterality    ED Discharge Orders    None       Elnora Morrison, MD 09/25/17 1507

## 2017-09-25 NOTE — ED Triage Notes (Signed)
Pt was sent from urgent care for SOB. Was dx with pneumonia on Monday and given a z-pack, has finished with no improvement.

## 2017-09-25 NOTE — Progress Notes (Signed)
Pharmacy Antibiotic Note  Diane Carlson is a 72 y.o. female admitted on 09/25/2017 with pneumonia.  Pharmacy has been consulted for Levaquin dosing.  Plan: Levaquin 750mg  IV x 1 in ED, then 500mg  IV q48hours F/u cxs and clinical progress Monitor V/S, labs  Height: 5\' 2"  (157.5 cm) Weight: 105 lb (47.6 kg) IBW/kg (Calculated) : 50.1  Temp (24hrs), Avg:97.7 F (36.5 C), Min:97.7 F (36.5 C), Max:97.7 F (36.5 C)  Recent Labs  Lab 09/25/17 1152  WBC 11.4*  CREATININE 2.22*    Estimated Creatinine Clearance: 17.7 mL/min (A) (by C-G formula based on SCr of 2.22 mg/dL (H)).    Allergies  Allergen Reactions  . Cephalexin   . Pravastatin     Muscle aches  . Sulfa Antibiotics   . Zetia [Ezetimibe]     mylagias    Antimicrobials this admission: levaquin 6/29 >>   Dose adjustments this admission: n/a  Microbiology results: 6/29 BCx: pending 6/29 strep pneumo: pending  Thank you for allowing pharmacy to be a part of this patient's care.  Isac Sarna, BS [place], [place] Clinical Pharmacist Pager [phone] 09/25/2017 2:38 PM

## 2017-09-25 NOTE — H&P (Signed)
History and Physical  Diane Carlson YBO:[id number] DOB: [birth year] DOA: 09/25/2017  Referring physician: Adelfa Koh PCP: Chipper Herb, MD   Chief Complaint: Shortness of breath  HPI: Diane Carlson is a 72 y.o. female former smoker with stage 4 CKD was diagnosed with a URI on 6/17 and given a prescription for azithromycin which she did not fill or take.  Her symptoms did not improve and she returned to see PCP on 6/19 and was given a prescription for amoxicillin and IM depo-Medrol.  She began taking the amoxicillin but did not seem to be getting better.  She returned to see PCP on 6/24 and was given a chest xray that confirmed LLL pneumonia.  She was given treatment with liquid azithromycin because of her difficulty swallowing pills.  She presented to ED today because she continues to feel ill with dyspnea on exertion and productive cough.  She had only minimal improvement when she was taking the antibiotic but symptoms worsened when the antibiotics were completed. She denies chest pain. She reports no fever or chills but is having diaphoresis. She reports thick green sputum production.  Her PMH is detailed below.  She is immunocompromised due to stage 4 CKD and Paget's disease.  Pt says she has given up on tabacco and not currently smoking any longer.   ED Course: Pt was seen in ED and had a chest xray that detailed an atypical pneumonia pattern.  She was given IV levofloxacin as dosed by the inpatient pharmacy and admission was requested because the patient had significant dyspnea on exertion.  Given her advanced age and that she has failed outpatient management she is being admitted for IV antibiotic therapy.    Review of Systems: All systems reviewed and apart from history of presenting illness, are negative.  Past Medical History:  Diagnosis Date  . Allergy   . Cholesteatoma   . Essential hypertension, benign   . FH: TAH-BSO (total abdominal hysterectomy and bilateral salpingo-oophorectomy)    . Gout   . Hx of appendectomy   . Hyperlipidemia   . Kidney disease   . Osteopenia   . Paget's disease   . Pneumonia   . Vitamin D deficiency    Past Surgical History:  Procedure Laterality Date  . ABDOMINAL HYSTERECTOMY    . APPENDECTOMY    . carotid endoectomy Left   . cataract Bilateral 02/27/14  . EYE SURGERY     catarat surgery bilaterally   Social History:  reports that she quit smoking about 8 years ago. Her smoking use included cigarettes. She has a 20.00 pack-year smoking history. She has never used smokeless tobacco. She reports that she does not drink alcohol or use drugs.  Allergies  Allergen Reactions  . Cephalexin   . Pravastatin     Muscle aches  . Sulfa Antibiotics   . Zetia [Ezetimibe]     mylagias    Family History  Problem Relation Age of Onset  . Heart disease Mother   . Stroke Mother 64  . Heart disease Father   . Heart attack Father   . Hypertension Son   . Heart attack Paternal Aunt   . Heart disease Paternal Aunt     Prior to Admission medications   Medication Sig Start Date End Date Taking? Authorizing Provider  amLODipine (NORVASC) 10 MG tablet Take 1 tablet (10 mg total) by mouth daily. 09/15/17  Yes Ronnie Doss M, DO  aspirin 81 MG tablet Take 81 mg by  mouth daily.     Yes [provider]  benzonatate (TESSALON PERLES) 100 MG capsule Take 1 capsule (100 mg total) by mouth 3 (three) times daily as needed for cough. 09/15/17  Yes Ronnie Doss M, DO  cholecalciferol (VITAMIN D) 1000 UNITS tablet Take 1,000 Units by mouth daily.   Yes [provider]  fish oil-omega-3 fatty acids 1000 MG capsule Take 2 g by mouth daily.   Yes [provider]  fluticasone (FLONASE) 50 MCG/ACT nasal spray Place 1 spray 2 (two) times daily as needed into both nostrils for allergies or rhinitis. 02/05/17  Yes Dettinger, Fransisca Kaufmann, MD  Garlic 2993 MG CAPS Take 1 capsule by mouth 2 (two) times daily.    Yes [provider]   levocetirizine (XYZAL) 5 MG tablet Take 1 tablet (5 mg total) by mouth every evening. 09/15/17  Yes Gottschalk, Leatrice Jewels M, DO  rosuvastatin (CRESTOR) 10 MG tablet Take 1 tablet (10 mg total) by mouth daily. 09/15/17  Yes [name], DO   Physical Exam: Vitals:   09/25/17 1132 09/25/17 1134 09/25/17 1300  BP:  (!) 148/65 133/63  Pulse:  (!) 101 88  Resp:  19 (!) 21  Temp:  97.7 F (36.5 C)   TempSrc:  Oral   SpO2:  99% 99%  Weight: 47.6 kg (105 lb)    Height: 5\' 2"  (1.575 m)       General exam: Moderately built and nourished patient, lying comfortably supine on the gurney in no obvious distress.  Head, eyes and ENT: Nontraumatic and normocephalic. Pupils equally reacting to light and accommodation. Oral mucosa dry.  Neck: Supple. No JVD, carotid bruit or thyromegaly.  Lymphatics: No lymphadenopathy.  Respiratory system: rales LLL with exp wheezing heard. No increased work of breathing.  Cardiovascular system: S1 and S2 heard, RRR. No JVD, murmurs, gallops, clicks or pedal edema.  Gastrointestinal system: Abdomen is nondistended, soft and nontender. Normal bowel sounds heard. No organomegaly or masses appreciated.  Central nervous system: Alert and oriented. No focal neurological deficits.  Extremities: Symmetric 5 x 5 power. Peripheral pulses symmetrically felt.   Skin: No rashes or acute findings.  Musculoskeletal system: Negative exam.  Psychiatry: Pleasant and cooperative.  Labs on Admission:  Basic Metabolic Panel: Recent Labs  Lab 09/25/17 1152  NA 138  K 3.9  CL 104  CO2 23  GLUCOSE 173*  BUN 29*  CREATININE 2.22*  CALCIUM 8.6*   Liver Function Tests: No results for input(s): AST, ALT, ALKPHOS, BILITOT, PROT, ALBUMIN in the last 168 hours. No results for input(s): LIPASE, AMYLASE in the last 168 hours. No results for input(s): AMMONIA in the last 168 hours. CBC: Recent Labs  Lab 09/25/17 1152  WBC 11.4*  NEUTROABS 9.5*  HGB 11.0*  HCT  34.4*  MCV 90.5  PLT 495*   Cardiac Enzymes: Recent Labs  Lab 09/25/17 1152  TROPONINI <0.03    BNP (last 3 results) No results for input(s): PROBNP in the last 8760 hours. CBG: No results for input(s): GLUCAP in the last 168 hours.  Radiological Exams on Admission: Dg Chest 2 View  Result Date: 09/25/2017 CLINICAL DATA:  Shortness of breath.  Recent diagnosis of pneumonia. EXAM: CHEST - 2 VIEW COMPARISON:  01/20/2016 and 09/20/2017 FINDINGS: Stable postsurgical changes at the left cervicothoracic junction. Cardiomediastinal silhouette is normal. Mediastinal contours appear intact. There is no evidence of pneumothorax. Bilateral upper lobe predominant emphysematous changes and peribronchial thickening. Patchy bilateral airspace consolidation in the lingula  and right middle lobe is slightly more prominent. Osseous structures are without acute abnormality. Soft tissues are grossly normal. IMPRESSION: Patchy bilateral airspace consolidation in the lingula and right middle lobe, may represent atypical pneumonia. Underlying emphysematous changes and peribronchial thickening. Electronically Signed   By: Fidela Salisbury M.D.   On: 09/25/2017 13:34    EKG: Independently reviewed. Sinus Tachycardia  Assessment/Plan Principal Problem:   Acute respiratory distress Active Problems:   Atypical pneumonia   Failure of outpatient treatment   Essential hypertension, benign   CKD (chronic kidney disease) stage 4, GFR 15-29 ml/min (HCC)   Gout   Anemia of chronic disease   Renal failure (ARF), acute on chronic (HCC)   Dehydration   Dyspnea on exertion   Productive cough   Leukocytosis   Hyperglycemia   1. Community Acquired Pneumonia - The patient continues to have symptoms despite 2 different courses of antibiotics.   Admit for IV antibiotics as she has failed outpatient management.  Pneumonia order set bundle utilized.  Continue supportive care.  IV levofloxacin ordered and dosed by  Pharmacy team.   Gentle IV fluids today.  Continue cough meds.  Scheduled nebulizer treatments ordered.  Supplemental oxygen if needed.   2. Acute on chronic renal failure stage 4 - Pt has mild dehydration, treat with gentle hydration, recheck in AM.  3. Anemia of CKD - recheck CBC in AM after hydration.  4. Mild dehydration - gentle IV fluids ordered.  5. Gout - stable.   6. DOE - treating pneumonia as above, supplemental oxygen if needed.  Recommend 6 min walking test when clinically better.   7. COPD with mild acute exacerbation - likely exacerbated by pneumonia.  Pt has a 20+ year smoking history but has quit tobacco.  Scheduled duoneb treatments ordered and will give IV solumedrol 80 mg x 1 dose.  Re-dose if needed. 8. Leukocytosis - secondary to pneumonia - treating as above.  9. Sinus tachycardia - gentle hydration, treat pneumonia.  10. Hyperglycemia - likely stress induced and patient had received IM steroids in PCP office.   11. Thrombocytosis - likely reactive.  Recheck in AM.   DVT Prophylaxis: lovenox Code Status: Full   Family Communication: bedside  Disposition Plan: inpatient treatment with IV antibiotics, IV fluids   Time spent: 57 mins  Irwin Brakeman, MD [facility] Pager [phone]  If 7PM-7AM, please contact night-coverage [email or web address] Password [facility] 09/25/2017, 2:37 PM

## 2017-09-26 ENCOUNTER — Inpatient Hospital Stay (HOSPITAL_COMMUNITY): Payer: Medicare PPO

## 2017-09-26 DIAGNOSIS — I10 Essential (primary) hypertension: Secondary | ICD-10-CM

## 2017-09-26 DIAGNOSIS — R0603 Acute respiratory distress: Secondary | ICD-10-CM

## 2017-09-26 DIAGNOSIS — J189 Pneumonia, unspecified organism: Secondary | ICD-10-CM

## 2017-09-26 DIAGNOSIS — N184 Chronic kidney disease, stage 4 (severe): Secondary | ICD-10-CM

## 2017-09-26 LAB — CBC WITH DIFFERENTIAL/PLATELET
BASOS ABS: 0 10*3/uL (ref 0.0–0.1)
BASOS PCT: 0 %
EOS PCT: 0 %
Eosinophils Absolute: 0 10*3/uL (ref 0.0–0.7)
HCT: 32.8 % — ABNORMAL LOW (ref 36.0–46.0)
Hemoglobin: 10.6 g/dL — ABNORMAL LOW (ref 12.0–15.0)
Lymphocytes Relative: 5 %
Lymphs Abs: 0.3 10*3/uL — ABNORMAL LOW (ref 0.7–4.0)
MCH: 29.4 pg (ref 26.0–34.0)
MCHC: 32.3 g/dL (ref 30.0–36.0)
MCV: 90.9 fL (ref 78.0–100.0)
Monocytes Absolute: 0.1 10*3/uL (ref 0.1–1.0)
Monocytes Relative: 1 %
NEUTROS PCT: 94 %
Neutro Abs: 6.3 10*3/uL (ref 1.7–7.7)
PLATELETS: 536 10*3/uL — AB (ref 150–400)
RBC: 3.61 MIL/uL — AB (ref 3.87–5.11)
RDW: 13 % (ref 11.5–15.5)
WBC: 6.7 10*3/uL (ref 4.0–10.5)

## 2017-09-26 LAB — COMPREHENSIVE METABOLIC PANEL
ALT: 42 U/L (ref 0–44)
AST: 22 U/L (ref 15–41)
Albumin: 3 g/dL — ABNORMAL LOW (ref 3.5–5.0)
Alkaline Phosphatase: 196 U/L — ABNORMAL HIGH (ref 38–126)
Anion gap: 11 (ref 5–15)
BUN: 30 mg/dL — ABNORMAL HIGH (ref 8–23)
CHLORIDE: 105 mmol/L (ref 98–111)
CO2: 22 mmol/L (ref 22–32)
CREATININE: 1.99 mg/dL — AB (ref 0.44–1.00)
Calcium: 9.2 mg/dL (ref 8.9–10.3)
GFR calc non Af Amer: 24 mL/min — ABNORMAL LOW (ref >60)
GFR, EST AFRICAN AMERICAN: 28 mL/min — AB (ref >60)
Glucose, Bld: 191 mg/dL — ABNORMAL HIGH (ref 70–99)
Potassium: 4.3 mmol/L (ref 3.5–5.1)
Sodium: 138 mmol/L (ref 135–145)
TOTAL PROTEIN: 7.7 g/dL (ref 6.5–8.1)
Total Bilirubin: 0.3 mg/dL (ref 0.3–1.2)

## 2017-09-26 LAB — STREP PNEUMONIAE URINARY ANTIGEN: STREP PNEUMO URINARY ANTIGEN: NEGATIVE

## 2017-09-26 LAB — MAGNESIUM: MAGNESIUM: 2 mg/dL (ref 1.7–2.4)

## 2017-09-26 MED ORDER — PREDNISONE 20 MG PO TABS
40.0000 mg | ORAL_TABLET | Freq: Every day | ORAL | Status: DC
  Administered 2017-09-26 – 2017-09-27 (×2): 40 mg via ORAL
  Filled 2017-09-26 (×2): qty 2

## 2017-09-26 MED ORDER — GUAIFENESIN 100 MG/5ML PO SOLN
15.0000 mL | Freq: Two times a day (BID) | ORAL | Status: DC
  Administered 2017-09-26 – 2017-09-27 (×2): 300 mg via ORAL
  Filled 2017-09-26: qty 5
  Filled 2017-09-26: qty 10
  Filled 2017-09-26: qty 15

## 2017-09-26 MED ORDER — ROSUVASTATIN CALCIUM 10 MG PO TABS
10.0000 mg | ORAL_TABLET | Freq: Every day | ORAL | Status: DC
  Administered 2017-09-26: 10 mg via ORAL
  Filled 2017-09-26: qty 1

## 2017-09-26 NOTE — Progress Notes (Signed)
Gave neb late due to error in seeing patient. Patient daughter notified nurse who paged RT. Patient has hx of smoking, suspect she also has underling asthma component due to Family hx. (father) She is not wheezing and appears very decreased with lung sounds. She does have decreased kidney function which appears in past labs.States neb treatment made her feel a little better. Saturations are 98 on room air. Did note BNP lab was 115 which shows some fluid retention. Patient also states she walks 2 miles a day on tread mill but hasn't been able to of late past 2 weeks. Will continue to watch.

## 2017-09-26 NOTE — Progress Notes (Signed)
PROGRESS NOTE    Diane Carlson  ZOX:[id number] DOB: 07-Jul-1945 DOA: 09/25/2017 PCP: Chipper Herb, MD    Brief Narrative:  72 y/o female with history of previous smoking, CKD 4, was bring treated for URI as an outpatient. Symptoms persisted and she came to the hospital where she was found to have pneumonia with mild copd exacerbation. Treated with bronchodilators, antibiotics and steroids. Slowly improving.    Assessment & Plan:   Principal Problem:   Acute respiratory distress Active Problems:   Essential hypertension, benign   CKD (chronic kidney disease) stage 4, GFR 15-29 ml/min (HCC)   Gout   Anemia of chronic disease   Atypical pneumonia   Failure of outpatient treatment   Renal failure (ARF), acute on chronic (HCC)   Dehydration   Dyspnea on exertion   Productive cough   Leukocytosis   Hyperglycemia   1. Community acquired pneumonia. Currently on levofloxacin. Afebrile. Appears to be slowly improving. Continue current treatments 2. Copd exacerbation. Improved with bronchodilators and dose of solumedrol yesterday. Will give a prednisone taper. Continue neb treatments. Will need outpatient PFTs 3. CKD stage 4. Mildly dehydrated on arrival. Creatinine is back to baseline with gentle hydration 4. Anemia of CKD. Hemoglobin has been stable. No signs of bleeding.   DVT prophylaxis: lovenox Code Status: full code Family Communication: discussed with family at the bedside Disposition Plan: discharge home when improved, possibly in AM   Consultants:     Procedures:     Antimicrobials:   levaquin 6/29>    Subjective: Feeling better, but not back to baseline. No cough  Objective: Vitals:   09/26/17 0051 09/26/17 0639 09/26/17 1106 09/26/17 1512  BP:  (!) 144/73  (!) 117/58  Pulse:  92  95  Resp:  18  20  Temp:  (!) 97.5 F (36.4 C)  98.5 F (36.9 C)  TempSrc:  Oral  Oral  SpO2: 98% 100% 99% 97%  Weight:      Height:        Intake/Output Summary  (Last 24 hours) at 09/26/2017 1649 Last data filed at 09/26/2017 1100 Gross per 24 hour  Intake 1137.5 ml  Output 1000 ml  Net 137.5 ml   Filed Weights   09/25/17 1132  Weight: 47.6 kg (105 lb)    Examination:  General exam: Appears calm and comfortable  Respiratory system: diminished breath sounds bilaterally. Respiratory effort normal. Cardiovascular system: S1 & S2 heard, RRR. No JVD, murmurs, rubs, gallops or clicks. No pedal edema. Gastrointestinal system: Abdomen is nondistended, soft and nontender. No organomegaly or masses felt. Normal bowel sounds heard. Central nervous system: Alert and oriented. No focal neurological deficits. Extremities: Symmetric 5 x 5 power. Skin: No rashes, lesions or ulcers Psychiatry: Judgement and insight appear normal. Mood & affect appropriate.     Data Reviewed: I have personally reviewed following labs and imaging studies  CBC: Recent Labs  Lab 09/25/17 1152 09/26/17 0649  WBC 11.4* 6.7  NEUTROABS 9.5* 6.3  HGB 11.0* 10.6*  HCT 34.4* 32.8*  MCV 90.5 90.9  PLT 495* 811*   Basic Metabolic Panel: Recent Labs  Lab 09/25/17 1152 09/26/17 0649  NA 138 138  K 3.9 4.3  CL 104 105  CO2 23 22  GLUCOSE 173* 191*  BUN 29* 30*  CREATININE 2.22* 1.99*  CALCIUM 8.6* 9.2  MG  --  2.0   GFR: Estimated Creatinine Clearance: 19.8 mL/min (A) (by C-G formula based on SCr of 1.99 mg/dL (H)). Liver  Function Tests: Recent Labs  Lab 09/26/17 0649  AST 22  ALT 42  ALKPHOS 196*  BILITOT 0.3  PROT 7.7  ALBUMIN 3.0*   No results for input(s): LIPASE, AMYLASE in the last 168 hours. No results for input(s): AMMONIA in the last 168 hours. Coagulation Profile: No results for input(s): INR, PROTIME in the last 168 hours. Cardiac Enzymes: Recent Labs  Lab 09/25/17 1152  TROPONINI <0.03   BNP (last 3 results) No results for input(s): PROBNP in the last 8760 hours. HbA1C: No results for input(s): HGBA1C in the last 72 hours. CBG: No  results for input(s): GLUCAP in the last 168 hours. Lipid Profile: No results for input(s): CHOL, HDL, LDLCALC, TRIG, CHOLHDL, LDLDIRECT in the last 72 hours. Thyroid Function Tests: No results for input(s): TSH, T4TOTAL, FREET4, T3FREE, THYROIDAB in the last 72 hours. Anemia Panel: No results for input(s): VITAMINB12, FOLATE, FERRITIN, TIBC, IRON, RETICCTPCT in the last 72 hours. Sepsis Labs: No results for input(s): PROCALCITON, LATICACIDVEN in the last 168 hours.  Recent Results (from the past 240 hour(s))  Culture, blood (routine x 2) Call MD if unable to obtain prior to antibiotics being given     Status: None (Preliminary result)   Collection Time: 09/25/17  7:14 PM  Result Value Ref Range Status   Specimen Description LEFT ANTECUBITAL  Final   Special Requests   Final    BOTTLES DRAWN AEROBIC AND ANAEROBIC Blood Culture adequate volume   Culture   Final    NO GROWTH < 12 HOURS Performed at [facility], [address], [place], [place] [address]    Report Status PENDING  Incomplete  Culture, blood (routine x 2) Call MD if unable to obtain prior to antibiotics being given     Status: None (Preliminary result)   Collection Time: 09/25/17  7:23 PM  Result Value Ref Range Status   Specimen Description BLOOD RIGHT HAND  Final   Special Requests   Final    BOTTLES DRAWN AEROBIC ONLY Blood Culture adequate volume   Culture   Final    NO GROWTH < 12 HOURS Performed at [facility], [address] [place] [address], [place], [place] [address]    Report Status PENDING  Incomplete         Radiology Studies: Dg Chest 2 View  Result Date: 09/25/2017 CLINICAL DATA:  Shortness of breath.  Recent diagnosis of pneumonia. EXAM: CHEST - 2 VIEW COMPARISON:  01/20/2016 and 09/20/2017 FINDINGS: Stable postsurgical changes at the left cervicothoracic junction. Cardiomediastinal silhouette is normal. Mediastinal contours appear intact. There is no evidence of pneumothorax. Bilateral upper lobe predominant  emphysematous changes and peribronchial thickening. Patchy bilateral airspace consolidation in the lingula and right middle lobe is slightly more prominent. Osseous structures are without acute abnormality. Soft tissues are grossly normal. IMPRESSION: Patchy bilateral airspace consolidation in the lingula and right middle lobe, may represent atypical pneumonia. Underlying emphysematous changes and peribronchial thickening. Electronically Signed   By: Fidela Salisbury M.D.   On: 09/25/2017 13:34   Dg Chest Port 1 View  Result Date: 09/26/2017 CLINICAL DATA:  Patient with acute respiratory distress EXAM: PORTABLE CHEST 1 VIEW COMPARISON:  Chest radiograph 09/25/2017 FINDINGS: Monitoring leads overlie the patient. Stable cardiac and mediastinal contours. Minimal heterogeneous opacities left lung base. Biapical pleuroparenchymal thickening/scarring. No pleural effusion or pneumothorax. IMPRESSION: Slight improved opacities within the left mid and lower lung which may represent atelectasis or infection. Electronically Signed   By: Lovey Newcomer M.D.   On: 09/26/2017  09:34        Scheduled Meds: . amLODipine  10 mg Oral Daily  . aspirin EC  81 mg Oral Daily  . enoxaparin (LOVENOX) injection  30 mg Subcutaneous Q24H  . guaiFENesin  15 mL Oral BID  . ipratropium-albuterol  3 mL Nebulization TID  . loratadine  10 mg Oral Daily  . predniSONE  40 mg Oral Q breakfast  . rosuvastatin  10 mg Oral q1800   Continuous Infusions: . [START ON 09/27/2017] levofloxacin (LEVAQUIN) IV       LOS: 1 day    Time spent: 54mins. Greater than 50% of this time spent in direct contact with patient discussing management of pneumonia, probable underlying copd, medication dosing in CKD    Kathie Dike, MD [facility] Pager [phone]  If 7PM-7AM, please contact night-coverage [email or web address] Password [email or web address] 09/26/2017, 4:49 PM

## 2017-09-26 NOTE — Progress Notes (Signed)
Patient up walking halls appears better no wheezing noted.

## 2017-09-27 ENCOUNTER — Ambulatory Visit: Payer: Medicare PPO | Admitting: Family Medicine

## 2017-09-27 DIAGNOSIS — D638 Anemia in other chronic diseases classified elsewhere: Secondary | ICD-10-CM

## 2017-09-27 LAB — LEGIONELLA PNEUMOPHILA SEROGP 1 UR AG: L. pneumophila Serogp 1 Ur Ag: NEGATIVE

## 2017-09-27 LAB — HIV ANTIBODY (ROUTINE TESTING W REFLEX): HIV Screen 4th Generation wRfx: NONREACTIVE

## 2017-09-27 MED ORDER — PREDNISONE 10 MG PO TABS: ORAL_TABLET | ORAL | 0 refills | Status: DC

## 2017-09-27 MED ORDER — IPRATROPIUM-ALBUTEROL 0.5-2.5 (3) MG/3ML IN SOLN
3.0000 mL | Freq: Two times a day (BID) | RESPIRATORY_TRACT | Status: DC
  Administered 2017-09-27: 3 mL via RESPIRATORY_TRACT

## 2017-09-27 MED ORDER — GUAIFENESIN 100 MG/5ML PO SOLN: 15.0000 mL | Freq: Two times a day (BID) | ORAL | 0 refills | Status: DC

## 2017-09-27 MED ORDER — ALBUTEROL SULFATE HFA 108 (90 BASE) MCG/ACT IN AERS: 2.0000 | INHALATION_SPRAY | Freq: Four times a day (QID) | RESPIRATORY_TRACT | 2 refills | Status: DC | PRN

## 2017-09-27 MED ORDER — LEVOFLOXACIN 750 MG PO TABS: ORAL_TABLET | ORAL | 0 refills | Status: DC

## 2017-09-27 NOTE — Discharge Summary (Signed)
Physician Discharge Summary  Diane Carlson IRC:[id number] DOB: [birth year] DOA: 09/25/2017  PCP: Chipper Herb, MD  Admit date: 09/25/2017 Discharge date: 09/27/2017  Admitted From: home Disposition:  home  Recommendations for Outpatient Follow-up:  1. Follow up with PCP in 1-2 weeks 2. Please obtain BMP/CBC in one week 3. Repeat chest xray in 3-4 weeks 4. Patient would benefit from outpatient PFTs in the next 3-4 weeks  Discharge Condition: stable CODE STATUS:full code Diet recommendation: heart healthy  Brief/Interim Summary: 72 y/o female with history of CKD 4, smoking, presented with persistent URI symptoms and was admitted to the hospital with pneumonia. It was felt that she may also have some mild copd exacerbation. She was started on intravenous antibiotics, bronchodilators and steroids. She quickly improved and is now able to ambulate comfortably on room air. Her cough has improved. She is not wheezing. She will be continued on a prednisone taper and albuterol inhaler. She will complete her course of antibiotics. She will need repeat chest xray in 3-4 weeks to ensure clearing. Would also benefit from outpatient PFTs since she does not have a formal diagnosis of COPD. The remainder of her medical problems remained stable. She feels ready to discharge home.  Discharge Diagnoses:  Principal Problem:   Acute respiratory distress Active Problems:   Essential hypertension, benign   CKD (chronic kidney disease) stage 4, GFR 15-29 ml/min (HCC)   Gout   Anemia of chronic disease   Atypical pneumonia   Failure of outpatient treatment   Renal failure (ARF), acute on chronic (HCC)   Dehydration   Dyspnea on exertion   Productive cough   Leukocytosis   Hyperglycemia    Discharge Instructions  Discharge Instructions    Diet - low sodium heart healthy   Complete by:  As directed    Increase activity slowly   Complete by:  As directed      Allergies as of 09/27/2017    Reactions   Cephalexin    Pravastatin    Muscle aches   Sulfa Antibiotics    Zetia [ezetimibe]    mylagias      Medication List    TAKE these medications   albuterol 108 (90 Base) MCG/ACT inhaler Commonly known as:  PROVENTIL HFA;VENTOLIN HFA Inhale 2 puffs into the lungs every 6 (six) hours as needed for wheezing or shortness of breath.   amLODipine 10 MG tablet Commonly known as:  NORVASC Take 1 tablet (10 mg total) by mouth daily.   aspirin 81 MG tablet Take 81 mg by mouth daily.   benzonatate 100 MG capsule Commonly known as:  TESSALON PERLES Take 1 capsule (100 mg total) by mouth 3 (three) times daily as needed for cough.   cholecalciferol 1000 units tablet Commonly known as:  VITAMIN D Take 1,000 Units by mouth daily.   fish oil-omega-3 fatty acids 1000 MG capsule Take 2 g by mouth daily.   fluticasone 50 MCG/ACT nasal spray Commonly known as:  FLONASE Place 1 spray 2 (two) times daily as needed into both nostrils for allergies or rhinitis.   Garlic 5102 MG Caps Take 1 capsule by mouth 2 (two) times daily.   guaiFENesin 100 MG/5ML Soln Commonly known as:  ROBITUSSIN Take 15 mLs (300 mg total) by mouth 2 (two) times daily.   levocetirizine 5 MG tablet Commonly known as:  XYZAL Take 1 tablet (5 mg total) by mouth every evening.   levofloxacin 750 MG tablet Commonly known as:  LEVAQUIN Take one  tablet by mouth every 48 hours. Ok to crush tablets   predniSONE 10 MG tablet Commonly known as:  DELTASONE Take 40mg  po daily for 2 days then 30mg  daily for 2 days then 20mg  daily for 2 days then 10mg  daily for 2 days then stop   rosuvastatin 10 MG tablet Commonly known as:  CRESTOR Take 1 tablet (10 mg total) by mouth daily.       Allergies  Allergen Reactions  . Cephalexin   . Pravastatin     Muscle aches  . Sulfa Antibiotics   . Zetia [Ezetimibe]     mylagias    Consultations:     Procedures/Studies: Dg Chest 2 View  Result Date:  09/25/2017 CLINICAL DATA:  Shortness of breath.  Recent diagnosis of pneumonia. EXAM: CHEST - 2 VIEW COMPARISON:  01/20/2016 and 09/20/2017 FINDINGS: Stable postsurgical changes at the left cervicothoracic junction. Cardiomediastinal silhouette is normal. Mediastinal contours appear intact. There is no evidence of pneumothorax. Bilateral upper lobe predominant emphysematous changes and peribronchial thickening. Patchy bilateral airspace consolidation in the lingula and right middle lobe is slightly more prominent. Osseous structures are without acute abnormality. Soft tissues are grossly normal. IMPRESSION: Patchy bilateral airspace consolidation in the lingula and right middle lobe, may represent atypical pneumonia. Underlying emphysematous changes and peribronchial thickening. Electronically Signed   By: Fidela Salisbury M.D.   On: 09/25/2017 13:34   Dg Chest 2 View  Result Date: 09/20/2017 CLINICAL DATA:  Cough, shortness of breath. EXAM: CHEST - 2 VIEW COMPARISON:  Radiographs of January 20, 2016. FINDINGS: The heart size and mediastinal contours are within normal limits. No pneumothorax or pleural effusion is noted. Stable biapical scarring is noted. No significant abnormality seen in the right lung. Increased lingular opacity is noted concerning for pneumonia or atelectasis. The visualized skeletal structures are unremarkable. IMPRESSION: Increased left lingular opacity is noted concerning for pneumonia or atelectasis. Electronically Signed   By: Marijo Conception, M.D.   On: 09/20/2017 09:49   Dg Chest Port 1 View  Result Date: 09/26/2017 CLINICAL DATA:  Patient with acute respiratory distress EXAM: PORTABLE CHEST 1 VIEW COMPARISON:  Chest radiograph 09/25/2017 FINDINGS: Monitoring leads overlie the patient. Stable cardiac and mediastinal contours. Minimal heterogeneous opacities left lung base. Biapical pleuroparenchymal thickening/scarring. No pleural effusion or pneumothorax. IMPRESSION: Slight  improved opacities within the left mid and lower lung which may represent atelectasis or infection. Electronically Signed   By: Lovey Newcomer M.D.   On: 09/26/2017 09:34       Subjective: Feeling better today. No significant cough or wheezing  Discharge Exam: Vitals:   09/27/17 0532 09/27/17 0729  BP: (!) 144/77   Pulse: 87   Resp: 16   Temp: 97.6 F (36.4 C)   SpO2: 100% 99%   Vitals:   09/26/17 2041 09/26/17 2242 09/27/17 0532 09/27/17 0729  BP:  130/65 (!) 144/77   Pulse:  96 87   Resp:  16 16   Temp:  98.2 F (36.8 C) 97.6 F (36.4 C)   TempSrc:  Oral Oral   SpO2: 96% 97% 100% 99%  Weight:      Height:        General: Pt is alert, awake, not in acute distress Cardiovascular: RRR, S1/S2 +, no rubs, no gallops Respiratory: CTA bilaterally, no wheezing, no rhonchi Abdominal: Soft, NT, ND, bowel sounds + Extremities: no edema, no cyanosis    The results of significant diagnostics from this hospitalization (including imaging, microbiology, ancillary and laboratory)  are listed below for reference.     Microbiology: Recent Results (from the past 240 hour(s))  Culture, blood (routine x 2) Call MD if unable to obtain prior to antibiotics being given     Status: None (Preliminary result)   Collection Time: 09/25/17  7:14 PM  Result Value Ref Range Status   Specimen Description LEFT ANTECUBITAL  Final   Special Requests   Final    BOTTLES DRAWN AEROBIC AND ANAEROBIC Blood Culture adequate volume   Culture   Final    NO GROWTH 2 DAYS Performed at [facility], [address], [place], [place] [address]    Report Status PENDING  Incomplete  Culture, blood (routine x 2) Call MD if unable to obtain prior to antibiotics being given     Status: None (Preliminary result)   Collection Time: 09/25/17  7:23 PM  Result Value Ref Range Status   Specimen Description BLOOD RIGHT HAND  Final   Special Requests   Final    BOTTLES DRAWN AEROBIC ONLY Blood Culture adequate volume    Culture   Final    NO GROWTH 2 DAYS Performed at [facility], [address] [place],  [address]    Report Status PENDING  Incomplete     Labs: BNP (last 3 results) Recent Labs    09/25/17 1152  BNP 062.6*   Basic Metabolic Panel: Recent Labs  Lab 09/25/17 1152 09/26/17 0649  NA 138 138  K 3.9 4.3  CL 104 105  CO2 23 22  GLUCOSE 173* 191*  BUN 29* 30*  CREATININE 2.22* 1.99*  CALCIUM 8.6* 9.2  MG  --  2.0   Liver Function Tests: Recent Labs  Lab 09/26/17 0649  AST 22  ALT 42  ALKPHOS 196*  BILITOT 0.3  PROT 7.7  ALBUMIN 3.0*   No results for input(s): LIPASE, AMYLASE in the last 168 hours. No results for input(s): AMMONIA in the last 168 hours. CBC: Recent Labs  Lab 09/25/17 1152 09/26/17 0649  WBC 11.4* 6.7  NEUTROABS 9.5* 6.3  HGB 11.0* 10.6*  HCT 34.4* 32.8*  MCV 90.5 90.9  PLT 495* 536*   Cardiac Enzymes: Recent Labs  Lab 09/25/17 1152  TROPONINI <0.03   BNP: Invalid input(s): POCBNP CBG: No results for input(s): GLUCAP in the last 168 hours. D-Dimer No results for input(s): DDIMER in the last 72 hours. Hgb A1c No results for input(s): HGBA1C in the last 72 hours. Lipid Profile No results for input(s): CHOL, HDL, LDLCALC, TRIG, CHOLHDL, LDLDIRECT in the last 72 hours. Thyroid function studies No results for input(s): TSH, T4TOTAL, T3FREE, THYROIDAB in the last 72 hours.  Invalid input(s): FREET3 Anemia work up No results for input(s): VITAMINB12, FOLATE, FERRITIN, TIBC, IRON, RETICCTPCT in the last 72 hours. Urinalysis    Component Value Date/Time   APPEARANCEUR Clear 03/04/2016 1532   GLUCOSEU Negative 03/04/2016 1532   BILIRUBINUR Negative 03/04/2016 1532   PROTEINUR 2+ (A) 03/04/2016 1532   UROBILINOGEN negative 03/04/2015 1115   NITRITE Negative 03/04/2016 1532   LEUKOCYTESUR Negative 03/04/2016 1532   Sepsis Labs Invalid input(s): PROCALCITONIN,  WBC,  LACTICIDVEN Microbiology Recent Results (from the past 240  hour(s))  Culture, blood (routine x 2) Call MD if unable to obtain prior to antibiotics being given     Status: None (Preliminary result)   Collection Time: 09/25/17  7:14 PM  Result Value Ref Range Status   Specimen Description LEFT ANTECUBITAL  Final   Special Requests  Final    BOTTLES DRAWN AEROBIC AND ANAEROBIC Blood Culture adequate volume   Culture   Final    NO GROWTH 2 DAYS Performed at [facility], [address], [place], [place] [address]    Report Status PENDING  Incomplete  Culture, blood (routine x 2) Call MD if unable to obtain prior to antibiotics being given     Status: None (Preliminary result)   Collection Time: 09/25/17  7:23 PM  Result Value Ref Range Status   Specimen Description BLOOD RIGHT HAND  Final   Special Requests   Final    BOTTLES DRAWN AEROBIC ONLY Blood Culture adequate volume   Culture   Final    NO GROWTH 2 DAYS Performed at [facility], [address], [place],  [address]    Report Status PENDING  Incomplete     Time coordinating discharge: 54mins  SIGNED:   Kathie Dike, MD  [facility] 09/27/2017, 1:43 PM Pager   If 7PM-7AM, please contact night-coverage [email or web address] Password [email or web address]

## 2017-09-27 NOTE — Care Management Important Message (Signed)
Important Message  Patient Details  Name: Diane Carlson MRN: [id number] Date of Birth: [birth year]   Medicare Important Message Given:  Yes    Shelda Altes 09/27/2017, 12:06 PM

## 2017-09-28 ENCOUNTER — Telehealth: Payer: Self-pay | Admitting: *Deleted

## 2017-09-28 NOTE — Telephone Encounter (Signed)
Was not able to complete contact for transitional care management in the appropriate timeframe.

## 2017-09-30 LAB — CULTURE, BLOOD (ROUTINE X 2)
CULTURE: NO GROWTH
Culture: NO GROWTH
SPECIAL REQUESTS: ADEQUATE
Special Requests: ADEQUATE

## 2017-10-08 ENCOUNTER — Telehealth: Payer: Self-pay | Admitting: Family Medicine

## 2017-10-08 NOTE — Progress Notes (Deleted)
Subjective: CC: Hospital follow up PNA PCP: [name], DO Diane Carlson is a 72 y.o. female presenting to clinic today for:  1.  Left lower lobe pneumonia Patient was hospitalized for left lower lobe pneumonia after failing outpatient azithromycin.  She is treated with IV antibiotics and bronchodilators in the hospital.  She was discharged 09/27/2017 with instructions to follow with PCP in 1 to 2 weeks.  It was recommended that she have repeat chest x-ray and formal PFTs in 3 to 4 weeks following discharge from the hospital.  She notes that since discharge she has been doing ***   ROS: Per HPI  Allergies  Allergen Reactions  . Cephalexin   . Pravastatin     Muscle aches  . Sulfa Antibiotics   . Zetia [Ezetimibe]     mylagias   Past Medical History:  Diagnosis Date  . Allergy   . Cholesteatoma   . Essential hypertension, benign   . FH: TAH-BSO (total abdominal hysterectomy and bilateral salpingo-oophorectomy)   . Gout   . Hx of appendectomy   . Hyperlipidemia   . Kidney disease   . Osteopenia   . Paget's disease   . Pneumonia   . Vitamin D deficiency     Current Outpatient Medications:  .  albuterol (PROVENTIL HFA;VENTOLIN HFA) 108 (90 Base) MCG/ACT inhaler, Inhale 2 puffs into the lungs every 6 (six) hours as needed for wheezing or shortness of breath., Disp: 1 Inhaler, Rfl: 2 .  amLODipine (NORVASC) 10 MG tablet, Take 1 tablet (10 mg total) by mouth daily., Disp: 90 tablet, Rfl: 1 .  aspirin 81 MG tablet, Take 81 mg by mouth daily.  , Disp: , Rfl:  .  benzonatate (TESSALON PERLES) 100 MG capsule, Take 1 capsule (100 mg total) by mouth 3 (three) times daily as needed for cough., Disp: 20 capsule, Rfl: 0 .  cholecalciferol (VITAMIN D) 1000 UNITS tablet, Take 1,000 Units by mouth daily., Disp: , Rfl:  .  fish oil-omega-3 fatty acids 1000 MG capsule, Take 2 g by mouth daily., Disp: , Rfl:  .  fluticasone (FLONASE) 50 MCG/ACT nasal spray, Place 1 spray 2 (two)  times daily as needed into both nostrils for allergies or rhinitis., Disp: 16 g, Rfl: 6 .  Garlic 8921 MG CAPS, Take 1 capsule by mouth 2 (two) times daily. , Disp: , Rfl:  .  guaiFENesin (ROBITUSSIN) 100 MG/5ML SOLN, Take 15 mLs (300 mg total) by mouth 2 (two) times daily., Disp: 1200 mL, Rfl: 0 .  levocetirizine (XYZAL) 5 MG tablet, Take 1 tablet (5 mg total) by mouth every evening., Disp: 90 tablet, Rfl: 3 .  levofloxacin (LEVAQUIN) 750 MG tablet, Take one tablet by mouth every 48 hours. Ok to crush tablets, Disp: 2 tablet, Rfl: 0 .  predniSONE (DELTASONE) 10 MG tablet, Take 40mg  po daily for 2 days then 30mg  daily for 2 days then 20mg  daily for 2 days then 10mg  daily for 2 days then stop, Disp: 20 tablet, Rfl: 0 .  rosuvastatin (CRESTOR) 10 MG tablet, Take 1 tablet (10 mg total) by mouth daily., Disp: 90 tablet, Rfl: 1 Social History   Socioeconomic History  . Marital status: Widowed    Spouse name: Not on file  . Number of children: 2  . Years of education: cosemtology certificate  . Highest education level: Some college, no degree  Occupational History  . 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  Financial resource strain: Not hard at all  . Food insecurity:    Worry: Never true    Inability: Never true  . Transportation needs:    Medical: No    Non-medical: No  Tobacco Use  . Smoking status: Former Smoker    Packs/day: 1.00    Years: 20.00    Pack years: 20.00    Types: Cigarettes    Last attempt to quit: 08/25/2009    Years since quitting: 8.1  . Smokeless tobacco: Never Used  Substance and Sexual Activity  . Alcohol use: No  . Drug use: No  . Sexual activity: Not Currently  Lifestyle  . Physical activity:    Days per week: 5 days    Minutes per session: 120 min  . Stress: Not at all  Relationships  . Social connections:    Talks on phone: More than three times a week    Gets together: More than three times a week     Attends religious service: More than 4 times per year    Active member of club or organization: Yes    Attends meetings of clubs or organizations: More than 4 times per year    Relationship status: Widowed  . Intimate partner violence:    Fear of current or ex partner: Not on file    Emotionally abused: Not on file    Physically abused: Not on file    Forced sexual activity: Not on file  Other Topics Concern  . Not on file  Social History Narrative  . Not on file   Family History  Problem Relation Age of Onset  . Heart disease Mother   . Stroke Mother 4  . Heart disease Father   . Heart attack Father   . Hypertension Son   . Heart attack Paternal Aunt   . Heart disease Paternal Aunt     Objective: Office vital signs reviewed. There were no vitals taken for this visit.  Physical Examination:  General: Awake, alert, *** nourished, No acute distress HEENT: Normal    Neck: No masses palpated. No lymphadenopathy    Ears: Tympanic membranes intact, normal light reflex, no erythema, no bulging    Eyes: PERRLA, extraocular membranes intact, sclera ***    Nose: nasal turbinates moist, *** nasal discharge    Throat: moist mucus membranes, no erythema, *** tonsillar exudate.  Airway is patent Cardio: regular rate and rhythm, S1S2 heard, no murmurs appreciated Pulm: clear to auscultation bilaterally, no wheezes, rhonchi or rales; normal work of breathing on room air GI: soft, non-tender, non-distended, bowel sounds present x4, no hepatomegaly, no splenomegaly, no masses GU: external vaginal tissue ***, cervix ***, *** punctate lesions on cervix appreciated, *** discharge from cervical os, *** bleeding, *** cervical motion tenderness, *** abdominal/ adnexal masses Extremities: warm, well perfused, No edema, cyanosis or clubbing; +*** pulses bilaterally MSK: *** gait and *** station Skin: dry; intact; no rashes or lesions Neuro: *** Strength and light touch sensation grossly intact,  *** DTRs ***/4  Assessment/ Plan: 72 y.o. female   ***  No orders of the defined types were placed in this encounter.  No orders of the defined types were placed in this encounter.    [name], DO [place] [phone]

## 2017-10-09 NOTE — Progress Notes (Signed)
Subjective: CC: Hospital follow up PNA PCP: [name], DO EPP:IRJJ A Fate is a 72 y.o. female presenting to clinic today for:  1.  Left lower lobe pneumonia Patient was hospitalized for left lower lobe pneumonia after failing outpatient azithromycin.  She is treated with IV antibiotics and bronchodilators in the hospital.  She was discharged 09/27/2017 with instructions to follow with PCP in 1 to 2 weeks.  It was recommended that she have repeat chest x-ray and formal PFTs in 3 to 4 weeks following discharge from the hospital.  She notes that since discharge she has been doing well.  She does note that she has not gotten back to her normal state of energy and gets tired easily.  She also notes some dyspnea on exertion but overall denies substantial shortness of breath.  No cough, hemoptysis, fevers.  She notes that she has the albuterol inhaler at home but has not been requiring this.   ROS: Per HPI  Allergies  Allergen Reactions  . Cephalexin   . Pravastatin     Muscle aches  . Sulfa Antibiotics   . Zetia [Ezetimibe]     mylagias   Past Medical History:  Diagnosis Date  . Allergy   . Cholesteatoma   . Essential hypertension, benign   . FH: TAH-BSO (total abdominal hysterectomy and bilateral salpingo-oophorectomy)   . Gout   . Hx of appendectomy   . Hyperlipidemia   . Kidney disease   . Osteopenia   . Paget's disease   . Pneumonia   . Vitamin D deficiency     Current Outpatient Medications:  .  albuterol (PROVENTIL HFA;VENTOLIN HFA) 108 (90 Base) MCG/ACT inhaler, Inhale 2 puffs into the lungs every 6 (six) hours as needed for wheezing or shortness of breath., Disp: 1 Inhaler, Rfl: 2 .  amLODipine (NORVASC) 10 MG tablet, Take 1 tablet (10 mg total) by mouth daily., Disp: 90 tablet, Rfl: 1 .  aspirin 81 MG tablet, Take 81 mg by mouth daily.  , Disp: , Rfl:  .  benzonatate (TESSALON PERLES) 100 MG capsule, Take 1 capsule (100 mg total) by mouth 3 (three) times daily  as needed for cough., Disp: 20 capsule, Rfl: 0 .  cholecalciferol (VITAMIN D) 1000 UNITS tablet, Take 1,000 Units by mouth daily., Disp: , Rfl:  .  fish oil-omega-3 fatty acids 1000 MG capsule, Take 2 g by mouth daily., Disp: , Rfl:  .  fluticasone (FLONASE) 50 MCG/ACT nasal spray, Place 1 spray 2 (two) times daily as needed into both nostrils for allergies or rhinitis., Disp: 16 g, Rfl: 6 .  Garlic 8841 MG CAPS, Take 1 capsule by mouth 2 (two) times daily. , Disp: , Rfl:  .  guaiFENesin (ROBITUSSIN) 100 MG/5ML SOLN, Take 15 mLs (300 mg total) by mouth 2 (two) times daily., Disp: 1200 mL, Rfl: 0 .  levocetirizine (XYZAL) 5 MG tablet, Take 1 tablet (5 mg total) by mouth every evening., Disp: 90 tablet, Rfl: 3 .  levofloxacin (LEVAQUIN) 750 MG tablet, Take one tablet by mouth every 48 hours. Ok to crush tablets, Disp: 2 tablet, Rfl: 0 .  predniSONE (DELTASONE) 10 MG tablet, Take 40mg  po daily for 2 days then 30mg  daily for 2 days then 20mg  daily for 2 days then 10mg  daily for 2 days then stop, Disp: 20 tablet, Rfl: 0 .  rosuvastatin (CRESTOR) 10 MG tablet, Take 1 tablet (10 mg total) by mouth daily., Disp: 90 tablet, Rfl: 1 Social History  Socioeconomic History  . Marital status: Widowed    Spouse name: Not on file  . Number of children: 2  . Years of education: cosemtology certificate  . Highest education level: Some college, no degree  Occupational History  . 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 Financial resource strain: Not hard at all  . Food insecurity:    Worry: Never true    Inability: Never true  . Transportation needs:    Medical: No    Non-medical: No  Tobacco Use  . Smoking status: Former Smoker    Packs/day: 1.00    Years: 20.00    Pack years: 20.00    Types: Cigarettes    Last attempt to quit: 08/25/2009    Years since quitting: 8.1  . Smokeless tobacco: Never Used  Substance and Sexual Activity  . Alcohol use: No  . Drug  use: No  . Sexual activity: Not Currently  Lifestyle  . Physical activity:    Days per week: 5 days    Minutes per session: 120 min  . Stress: Not at all  Relationships  . Social connections:    Talks on phone: More than three times a week    Gets together: More than three times a week    Attends religious service: More than 4 times per year    Active member of club or organization: Yes    Attends meetings of clubs or organizations: More than 4 times per year    Relationship status: Widowed  . Intimate partner violence:    Fear of current or ex partner: Not on file    Emotionally abused: Not on file    Physically abused: Not on file    Forced sexual activity: Not on file  Other Topics Concern  . Not on file  Social History Narrative  . Not on file   Family History  Problem Relation Age of Onset  . Heart disease Mother   . Stroke Mother 42  . Heart disease Father   . Heart attack Father   . Hypertension Son   . Heart attack Paternal Aunt   . Heart disease Paternal Aunt     Objective: Office vital signs reviewed. BP 138/77   Pulse 80   Temp (!) 97.1 F (36.2 C) (Oral)   Ht 5\' 2"  (1.575 m)   Wt 103 lb (46.7 kg)   SpO2 100%   BMI 18.84 kg/m   Physical Examination:  General: Awake, alert, well nourished, No acute distress HEENT: Normal    Eyes: PERRLA, extraocular membranes intact, sclera white    Throat: moist mucus membranes Cardio: regular rate and rhythm, S1S2 heard, no murmurs appreciated Pulm: mild intermittent expiratory wheezes on right. No rhonchi or rales; normal work of breathing on room air Extremities: warm, well perfused, No edema, cyanosis or clubbing; +2 pulses bilaterally MSK: normal gait and normal station  Assessment/ Plan: 72 y.o. female   1. Hospital discharge follow-up Doing well after discharge from hospital.  Her physical exam was remarkable for mild intermittent expiratory wheezes in the right lung fields.  She has normal work of  breathing on room air with normal oxygenation.  She is not currently using the albuterol but I did encourage her to use it if needed for shortness of breath.  We discussed that she may gradually get back to the gym with light activities.  Plan to repeat chest x-ray in 2 weeks.  She will be  4 weeks status post discharge from hospital at that time.  Will recheck CBC and BMP today.  I placed a referral to pulmonology to evaluate for possible COPD, as it was suspected that she was having a COPD exacerbation in the hospital.  2. Pneumonia of left lower lobe due to infectious organism ([place]) - CBC with Differential - DG Chest 2 View; Future  3. CKD (chronic kidney disease) stage 4, GFR 15-29 ml/min ([facility]) - Basic Metabolic Panel  4. Chronic obstructive pulmonary disease, unspecified COPD type ([place]) - Ambulatory referral to Pulmonology   Orders Placed This Encounter  Procedures  . DG Chest 2 View    Standing Status:   Future    Standing Expiration Date:   12/13/2018    Order Specific Question:   Reason for Exam (SYMPTOM  OR DIAGNOSIS REQUIRED)    Answer:   LLL pneumonia s/p IV abx 4 weeks ago.    Order Specific Question:   Preferred imaging location?    Answer:   Internal    Order Specific Question:   Radiology Contrast Protocol - do NOT remove file path    Answer:   [email or web address]  . CBC with Differential  . Basic Metabolic Panel  . Ambulatory referral to Pulmonology    Referral Priority:   Routine    Referral Type:   Consultation    Referral Reason:   Specialty Services Required    Requested Specialty:   Pulmonary Disease    Number of Visits Requested:   [place], [place] [phone]

## 2017-10-11 ENCOUNTER — Encounter: Payer: Self-pay | Admitting: Family Medicine

## 2017-10-11 ENCOUNTER — Ambulatory Visit: Payer: Medicare PPO | Admitting: Family Medicine

## 2017-10-11 VITALS — BP 138/77 | HR 80 | Temp 97.1°F | Ht 62.0 in | Wt 103.0 lb

## 2017-10-11 DIAGNOSIS — N184 Chronic kidney disease, stage 4 (severe): Secondary | ICD-10-CM | POA: Diagnosis not present

## 2017-10-11 DIAGNOSIS — Z09 Encounter for follow-up examination after completed treatment for conditions other than malignant neoplasm: Secondary | ICD-10-CM | POA: Diagnosis not present

## 2017-10-11 DIAGNOSIS — J181 Lobar pneumonia, unspecified organism: Secondary | ICD-10-CM | POA: Diagnosis not present

## 2017-10-11 DIAGNOSIS — J189 Pneumonia, unspecified organism: Secondary | ICD-10-CM

## 2017-10-11 DIAGNOSIS — J449 Chronic obstructive pulmonary disease, unspecified: Secondary | ICD-10-CM

## 2017-10-11 LAB — CBC WITH DIFFERENTIAL/PLATELET
Basophils Absolute: 0 10*3/uL (ref 0.0–0.2)
Basos: 0 %
EOS (ABSOLUTE): 0.4 10*3/uL (ref 0.0–0.4)
Eos: 5 %
HEMATOCRIT: 34.5 % (ref 34.0–46.6)
HEMOGLOBIN: 11.5 g/dL (ref 11.1–15.9)
Immature Grans (Abs): 0.1 10*3/uL (ref 0.0–0.1)
Immature Granulocytes: 1 %
LYMPHS ABS: 1.2 10*3/uL (ref 0.7–3.1)
Lymphs: 14 %
MCH: 28.5 pg (ref 26.6–33.0)
MCHC: 33.3 g/dL (ref 31.5–35.7)
MCV: 86 fL (ref 79–97)
MONOS ABS: 0.7 10*3/uL (ref 0.1–0.9)
Monocytes: 8 %
Neutrophils Absolute: 6.2 10*3/uL (ref 1.4–7.0)
Neutrophils: 72 %
Platelets: 268 10*3/uL (ref 150–450)
RBC: 4.03 x10E6/uL (ref 3.77–5.28)
RDW: 14.7 % (ref 12.3–15.4)
WBC: 8.6 10*3/uL (ref 3.4–10.8)

## 2017-10-11 LAB — BASIC METABOLIC PANEL
BUN / CREAT RATIO: 12 (ref 12–28)
BUN: 27 mg/dL (ref 8–27)
CO2: 23 mmol/L (ref 20–29)
CREATININE: 2.2 mg/dL — AB (ref 0.57–1.00)
Calcium: 9.7 mg/dL (ref 8.7–10.3)
Chloride: 100 mmol/L (ref 96–106)
GFR, EST AFRICAN AMERICAN: 25 mL/min/{1.73_m2} — AB (ref >59)
GFR, EST NON AFRICAN AMERICAN: 22 mL/min/{1.73_m2} — AB (ref >59)
Glucose: 87 mg/dL (ref 65–99)
Potassium: 5 mmol/L (ref 3.5–5.2)
Sodium: 137 mmol/L (ref 134–144)

## 2017-10-11 NOTE — Telephone Encounter (Signed)
Patient has a follow up appointment scheduled for today.  

## 2017-10-11 NOTE — Patient Instructions (Signed)
Your chest xray has been ordered.  Come to have this done in 2 weeks.  You had labs performed today.  You will be contacted with the results of the labs once they are available, usually in the next 3 business days for routine lab work.   I have also placed a referral to the lung specialist to evaluate you for COPD.

## 2017-10-20 ENCOUNTER — Ambulatory Visit (INDEPENDENT_AMBULATORY_CARE_PROVIDER_SITE_OTHER): Payer: Medicare PPO

## 2017-10-20 DIAGNOSIS — J181 Lobar pneumonia, unspecified organism: Secondary | ICD-10-CM | POA: Diagnosis not present

## 2017-10-20 DIAGNOSIS — J189 Pneumonia, unspecified organism: Secondary | ICD-10-CM

## 2017-11-10 ENCOUNTER — Ambulatory Visit (INDEPENDENT_AMBULATORY_CARE_PROVIDER_SITE_OTHER): Payer: Medicare PPO

## 2017-11-10 ENCOUNTER — Ambulatory Visit: Payer: Medicare PPO | Admitting: Family Medicine

## 2017-11-10 ENCOUNTER — Encounter: Payer: Self-pay | Admitting: Family Medicine

## 2017-11-10 VITALS — BP 135/74 | HR 78 | Temp 96.9°F | Ht 62.0 in | Wt 105.8 lb

## 2017-11-10 DIAGNOSIS — J189 Pneumonia, unspecified organism: Secondary | ICD-10-CM

## 2017-11-10 DIAGNOSIS — J181 Lobar pneumonia, unspecified organism: Secondary | ICD-10-CM

## 2017-11-10 NOTE — Patient Instructions (Signed)
Your lungs sound clear today.  We will get your chest x-ray and take a closer look for complete resolution of your pneumonia.  You may resume your normal activities.

## 2017-11-10 NOTE — Progress Notes (Signed)
Subjective: CC: f/u PNA PCP: Diane Norlander, DO MCN:OBSJ A Diane Carlson is a 72 y.o. female presenting to clinic today for:  1. LLL PNA Patient is now 4 weeks out from hospital discharge for left lower lobe pneumonia.  She returns today for follow-up chest x-ray and follow-up exam.  She notes great improvement in breathing.  She is gone back to the gym is tolerating exercise without difficulty.  She has not resumed Zumba because she was unsure if she was allowed to do this yet.  Denies any shortness of breath, wheeze, hemoptysis or cough.  No fevers.   ROS: Per HPI  Allergies  Allergen Reactions  . Cephalexin   . Pravastatin     Muscle aches  . Sulfa Antibiotics   . Zetia [Ezetimibe]     mylagias   Past Medical History:  Diagnosis Date  . Allergy   . Cholesteatoma   . Essential hypertension, benign   . FH: TAH-BSO (total abdominal hysterectomy and bilateral salpingo-oophorectomy)   . Gout   . Hx of appendectomy   . Hyperlipidemia   . Kidney disease   . Osteopenia   . Paget's disease   . Pneumonia   . Vitamin D deficiency     Current Outpatient Medications:  .  albuterol (PROVENTIL HFA;VENTOLIN HFA) 108 (90 Base) MCG/ACT inhaler, Inhale 2 puffs into the lungs every 6 (six) hours as needed for wheezing or shortness of breath., Disp: 1 Inhaler, Rfl: 2 .  amLODipine (NORVASC) 10 MG tablet, Take 1 tablet (10 mg total) by mouth daily., Disp: 90 tablet, Rfl: 1 .  aspirin 81 MG tablet, Take 81 mg by mouth daily.  , Disp: , Rfl:  .  cholecalciferol (VITAMIN D) 1000 UNITS tablet, Take 1,000 Units by mouth daily., Disp: , Rfl:  .  fish oil-omega-3 fatty acids 1000 MG capsule, Take 2 g by mouth daily., Disp: , Rfl:  .  fluticasone (FLONASE) 50 MCG/ACT nasal spray, Place 1 spray 2 (two) times daily as needed into both nostrils for allergies or rhinitis., Disp: 16 g, Rfl: 6 .  Garlic 6283 MG CAPS, Take 1 capsule by mouth 2 (two) times daily. , Disp: , Rfl:  .  levocetirizine (XYZAL)  5 MG tablet, Take 1 tablet (5 mg total) by mouth every evening., Disp: 90 tablet, Rfl: 3 .  rosuvastatin (CRESTOR) 10 MG tablet, Take 1 tablet (10 mg total) by mouth daily., Disp: 90 tablet, Rfl: 1 Social History   Socioeconomic History  . Marital status: Widowed    Spouse name: Not on file  . Number of children: 2  . Years of education: cosemtology certificate  . Highest education level: Some college, no degree  Occupational History  . 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 Financial resource strain: Not hard at all  . Food insecurity:    Worry: Never true    Inability: Never true  . Transportation needs:    Medical: No    Non-medical: No  Tobacco Use  . Smoking status: Former Smoker    Packs/day: 1.00    Years: 20.00    Pack years: 20.00    Types: Cigarettes    Last attempt to quit: 08/25/2009    Years since quitting: 8.2  . Smokeless tobacco: Never Used  Substance and Sexual Activity  . Alcohol use: No  . Drug use: No  . Sexual activity: Not Currently  Lifestyle  . Physical activity:    Days  per week: 5 days    Minutes per session: 120 min  . Stress: Not at all  Relationships  . Social connections:    Talks on phone: More than three times a week    Gets together: More than three times a week    Attends religious service: More than 4 times per year    Active member of club or organization: Yes    Attends meetings of clubs or organizations: More than 4 times per year    Relationship status: Widowed  . Intimate partner violence:    Fear of current or ex partner: Not on file    Emotionally abused: Not on file    Physically abused: Not on file    Forced sexual activity: Not on file  Other Topics Concern  . Not on file  Social History Narrative  . Not on file   Family History  Problem Relation Age of Onset  . Heart disease Mother   . Stroke Mother 63  . Heart disease Father   . Heart attack Father   . Hypertension  Son   . Heart attack Paternal Aunt   . Heart disease Paternal Aunt     Objective: Office vital signs reviewed. BP 135/74   Pulse 78   Temp (!) 96.9 F (36.1 C) (Oral)   Ht 5\' 2"  (1.575 m)   Wt 105 lb 12.8 oz (48 kg)   SpO2 99%   BMI 19.35 kg/m   Physical Examination:  General: Awake, alert, well nourished, well appearing. No acute distress HEENT: Normal, MMM Cardio: regular rate and rhythm, S1S2 heard, no murmurs appreciated Pulm: clear to auscultation bilaterally, no wheezes, rhonchi or rales; normal work of breathing on room air  Assessment/ Plan: 72 y.o. female   1. Pneumonia of left lower lobe due to infectious organism [facility]) Pulmonary exam unremarkable.  Patient has normal work of breathing on room air and normal oxygen saturation with normal vital signs.  She is looking really good compared to when we first saw each other.  She may resume all activities as tolerated.  We will repeat chest x-ray for completion as her follow-up chest x-ray at the end of July showed persistent left lower lobe opacity.  Upon personal review no pulmonary infiltrates appreciated.  However, still awaiting formal review by radiology. - DG Chest 2 View; Future   Orders Placed This Encounter  Procedures  . DG Chest 2 View    Standing Status:   Future    Standing Expiration Date:   01/10/2019    Order Specific Question:   Reason for Exam (SYMPTOM  OR DIAGNOSIS REQUIRED)    Answer:   repeat for pneumonia    Order Specific Question:   Preferred imaging location?    Answer:   Internal    Diane Carlson, [place] [phone]

## 2017-11-17 DIAGNOSIS — I10 Essential (primary) hypertension: Secondary | ICD-10-CM | POA: Diagnosis not present

## 2017-11-17 DIAGNOSIS — J189 Pneumonia, unspecified organism: Secondary | ICD-10-CM | POA: Diagnosis not present

## 2017-11-19 ENCOUNTER — Other Ambulatory Visit (HOSPITAL_COMMUNITY): Payer: Self-pay | Admitting: Respiratory Therapy

## 2017-11-19 DIAGNOSIS — R0602 Shortness of breath: Secondary | ICD-10-CM

## 2017-11-23 ENCOUNTER — Other Ambulatory Visit: Payer: Self-pay | Admitting: *Deleted

## 2017-11-23 MED ORDER — LEVOCETIRIZINE DIHYDROCHLORIDE 5 MG PO TABS: 5.0000 mg | ORAL_TABLET | Freq: Every evening | ORAL | 1 refills | Status: DC

## 2017-11-25 ENCOUNTER — Ambulatory Visit (HOSPITAL_COMMUNITY)
Admission: RE | Admit: 2017-11-25 | Discharge: 2017-11-25 | Disposition: A | Discharge status: Home / Self Care | Payer: Medicare PPO | Source: Ambulatory Visit | Attending: Pulmonary Disease | Admitting: Pulmonary Disease

## 2017-11-25 DIAGNOSIS — R0602 Shortness of breath: Secondary | ICD-10-CM | POA: Diagnosis not present

## 2017-11-25 LAB — PULMONARY FUNCTION TEST
DL/VA % pred: 61 %
DL/VA: 2.86 ml/min/mmHg/L
DLCO unc % pred: 40 %
DLCO unc: 9.21 ml/min/mmHg
FEF 25-75 PRE: 0.38 L/s
FEF 25-75 Post: 0.38 L/sec
FEF2575-%CHANGE-POST: 0 %
FEF2575-%Pred-Post: 20 %
FEF2575-%Pred-Pre: 20 %
FEV1-%Change-Post: 6 %
FEV1-%PRED-POST: 43 %
FEV1-%Pred-Pre: 40 %
FEV1-POST: 0.93 L
FEV1-PRE: 0.88 L
FEV1FVC-%CHANGE-POST: 1 %
FEV1FVC-%Pred-Pre: 62 %
FEV6-%CHANGE-POST: 1 %
FEV6-%Pred-Post: 65 %
FEV6-%Pred-Pre: 64 %
FEV6-POST: 1.79 L
FEV6-PRE: 1.75 L
FEV6FVC-%Change-Post: -2 %
FEV6FVC-%Pred-Post: 96 %
FEV6FVC-%Pred-Pre: 99 %
FVC-%CHANGE-POST: 4 %
FVC-%PRED-PRE: 65 %
FVC-%Pred-Post: 68 %
FVC-POST: 1.94 L
FVC-Pre: 1.86 L
POST FEV6/FVC RATIO: 92 %
Post FEV1/FVC ratio: 48 %
Pre FEV1/FVC ratio: 47 %
Pre FEV6/FVC Ratio: 94 %
RV % PRED: 296 %
RV: 6.37 L
TLC % pred: 169 %
TLC: 8.32 L

## 2017-11-25 MED ORDER — ALBUTEROL SULFATE (2.5 MG/3ML) 0.083% IN NEBU
2.5000 mg | INHALATION_SOLUTION | Freq: Once | RESPIRATORY_TRACT | Status: AC
  Administered 2017-11-25: 2.5 mg via RESPIRATORY_TRACT

## 2017-12-23 DIAGNOSIS — R490 Dysphonia: Secondary | ICD-10-CM | POA: Diagnosis not present

## 2017-12-23 DIAGNOSIS — J449 Chronic obstructive pulmonary disease, unspecified: Secondary | ICD-10-CM | POA: Diagnosis not present

## 2017-12-23 DIAGNOSIS — R739 Hyperglycemia, unspecified: Secondary | ICD-10-CM | POA: Diagnosis not present

## 2017-12-23 DIAGNOSIS — I10 Essential (primary) hypertension: Secondary | ICD-10-CM | POA: Diagnosis not present

## 2018-01-14 ENCOUNTER — Encounter: Payer: Self-pay | Admitting: Family Medicine

## 2018-01-14 ENCOUNTER — Ambulatory Visit: Payer: Medicare PPO | Admitting: Family Medicine

## 2018-01-14 ENCOUNTER — Other Ambulatory Visit: Payer: Self-pay | Admitting: Family Medicine

## 2018-01-14 VITALS — BP 110/67 | HR 95 | Temp 98.2°F | Ht 62.0 in | Wt 110.0 lb

## 2018-01-14 DIAGNOSIS — J0110 Acute frontal sinusitis, unspecified: Secondary | ICD-10-CM

## 2018-01-14 MED ORDER — AMOXICILLIN-POT CLAVULANATE 875-125 MG PO TABS: 1.0000 | ORAL_TABLET | Freq: Two times a day (BID) | ORAL | 0 refills | Status: DC

## 2018-01-14 NOTE — Progress Notes (Signed)
  Subjective:     Diane Carlson is a 72 y.o. female who presents for evaluation of symptoms of a URI, possible sinusitis. Symptoms include bilateral ear pressure/pain, achiness, congestion, coryza, cough described as nonproductive and waxing and waning over time, facial pain, fever and chills, nasal congestion, non productive cough and post nasal drip. Onset of symptoms was 7 days ago, and has been gradually worsening since that time. Treatment to date: none.  The following portions of the patient's history were reviewed and updated as appropriate: allergies, current medications, past family history, past medical history, past social history, past surgical history and problem list.  Review of Systems Constitutional: positive for chills and fevers Eyes: negative Ears, nose, mouth, throat, and face: positive for nasal congestion, sore throat and pressure in ears and in face Respiratory: positive for cough Cardiovascular: negative Gastrointestinal: negative Neurological: positive for headaches   Objective:    BP 110/67   Pulse 95   Temp 98.2 F (36.8 C) (Oral)   Ht 5\' 2"  (1.575 m)   Wt 110 lb (49.9 kg)   BMI 20.12 kg/m  General appearance: alert, cooperative, appears stated age and no distress Head: Normocephalic, without obvious abnormality, atraumatic, sinuses tender to percussion Eyes: conjunctivae/corneas clear. PERRL, EOM's intact. Fundi benign. Ears: abnormal TM right ear - serous middle ear fluid and abnormal TM left ear - serous middle ear fluid Nose: Nares normal. Septum midline. Mucosa normal. No drainage or sinus tenderness., clear discharge, mild congestion, turbinates red, swollen, sinus tenderness bilateral, moderate frontal sinus tenderness bilateral, no polyps, no crusting or bleeding points Throat: lips, mucosa, and tongue normal; teeth and gums normal, posterior oropharyngeal erythema without exudate, postnasal drainage present  Neck: no adenopathy, no carotid bruit, no  JVD, supple, symmetrical, trachea midline and thyroid not enlarged, symmetric, no tenderness/mass/nodules Back: negative, no tenderness to percussion or palpation Lungs: clear to auscultation bilaterally and normal percussion bilaterally Heart: regular rate and rhythm, S1, S2 normal, no murmur, click, rub or gallop Skin: Skin color, texture, turgor normal. No rashes or lesions Lymph nodes: Cervical, supraclavicular, and axillary nodes normal. Neurologic: Grossly normal   Assessment:   1. Acute non-recurrent frontal sinusitis Increase fluids. Add humidity to air. Claritin daily. Flonase over the counter for two weeks. Tylenol as needed for fever or pain control. Medications as prescribed.  Pt states she is able to take amoxicillin and augmentin without adverse reactions.  - amoxicillin-clavulanate (AUGMENTIN) 875-125 MG tablet; Take 1 tablet by mouth 2 (two) times daily for 10 days.  Dispense: 20 tablet; Refill: 0   Plan:    Discussed the diagnosis and treatment of sinusitis. Suggested symptomatic OTC remedies. Nasal saline spray for congestion. Augmentin per orders. Follow up as needed. Call in 7 days if symptoms aren't resolving. Follow up with PCP in 4 weeks or as needed.

## 2018-01-14 NOTE — Patient Instructions (Signed)

## 2018-01-19 ENCOUNTER — Ambulatory Visit (INDEPENDENT_AMBULATORY_CARE_PROVIDER_SITE_OTHER): Payer: Medicare PPO

## 2018-01-19 ENCOUNTER — Encounter: Payer: Self-pay | Admitting: Family Medicine

## 2018-01-19 ENCOUNTER — Ambulatory Visit: Payer: Medicare PPO | Admitting: Family Medicine

## 2018-01-19 VITALS — BP 131/75 | HR 116 | Temp 97.5°F | Ht 62.0 in | Wt 110.0 lb

## 2018-01-19 DIAGNOSIS — J189 Pneumonia, unspecified organism: Secondary | ICD-10-CM

## 2018-01-19 DIAGNOSIS — R059 Cough, unspecified: Secondary | ICD-10-CM

## 2018-01-19 DIAGNOSIS — J181 Lobar pneumonia, unspecified organism: Secondary | ICD-10-CM | POA: Diagnosis not present

## 2018-01-19 DIAGNOSIS — R05 Cough: Secondary | ICD-10-CM

## 2018-01-19 MED ORDER — LEVOFLOXACIN 750 MG PO TABS: 750.0000 mg | ORAL_TABLET | Freq: Every day | ORAL | 0 refills | Status: AC

## 2018-01-19 NOTE — Patient Instructions (Addendum)
Your chest x-ray was abnormal today.  I have started you on Levaquin to take 1 pill daily.  Take this away from dairy.  It may be taken with food.  If you are not having any improvement within the next 48 hours or if symptoms worsen, I want to to seek immediate medical attention the emergency department.  You had labs performed today.  You will be contacted with the results of the labs once they are available, usually in the next 3 business days for routine lab work.   Community-Acquired Pneumonia, Adult Pneumonia is an infection of the lungs. One type of pneumonia can happen while a person is in a hospital. A different type can happen when a person is not in a hospital (community-acquired pneumonia). It is easy for this kind to spread from person to person. It can spread to you if you breathe near an infected person who coughs or sneezes. Some symptoms include:  A dry cough.  A wet (productive) cough.  Fever.  Sweating.  Chest pain.  Follow these instructions at home:  Take over-the-counter and prescription medicines only as told by your doctor. ? Only take cough medicine if you are losing sleep. ? If you were prescribed an antibiotic medicine, take it as told by your doctor. Do not stop taking the antibiotic even if you start to feel better.  Sleep with your head and neck raised (elevated). You can do this by putting a few pillows under your head, or you can sleep in a recliner.  Do not use tobacco products. These include cigarettes, chewing tobacco, and e-cigarettes. If you need help quitting, ask your doctor.  Drink enough water to keep your pee (urine) clear or pale yellow. A shot (vaccine) can help prevent pneumonia. Shots are often suggested for:  People older than 71 years of age.  People older than 71 years of age: ? Who are having cancer treatment. ? Who have long-term (chronic) lung disease. ? Who have problems with their body's defense system (immune system).  You may  also prevent pneumonia if you take these actions:  Get the flu (influenza) shot every year.  Go to the dentist as often as told.  Wash your hands often. If soap and water are not available, use hand sanitizer.  Contact a doctor if:  You have a fever.  You lose sleep because your cough medicine does not help. Get help right away if:  You are short of breath and it gets worse.  You have more chest pain.  Your sickness gets worse. This is very serious if: ? You are an older adult. ? Your body's defense system is weak.  You cough up blood. This information is not intended to replace advice given to you by your health care provider. Make sure you discuss any questions you have with your health care provider. Document Released: 09/02/2007 Document Revised: 08/22/2015 Document Reviewed: 07/11/2014 Elsevier Interactive Patient Education  [employer].

## 2018-01-19 NOTE — Progress Notes (Signed)
Subjective: CC: Cough PCP: [name], DO RDE:YCXK A Diane Carlson is a 72 y.o. female presenting to clinic today for:  1. Cough Patient was seen on 01/14/2018 for URI/sinusitis.  She was complaining of bilateral ear pain, myalgia, nonproductive cough and postnasal drip that have been ongoing for about 7 days and gradually worsening.  She was instructed to increase fluids, use a humidifier and Claritin and Flonase.  She was placed on Augmentin 875 p.o. twice daily with instructions to follow-up if symptoms were not improving.  Patient follows up today reporting the cough has worsened.  He continued to be productive.  She reports shortness of breath with exertion.  No fevers or hemoptysis.  She notes that she has been trying to take the Augmentin but the pills are so large she is not sure she is getting the full dose.  She reports associated diarrhea with the Augmentin.  Past medical history significant for severe COPD.  She is seen by Dr Luan Pulling and is treated with Trelegy.  ROS: Per HPI  Allergies  Allergen Reactions  . Cephalexin   . Pravastatin     Muscle aches  . Sulfa Antibiotics   . Zetia [Ezetimibe]     mylagias   Past Medical History:  Diagnosis Date  . Allergy   . Cholesteatoma   . Essential hypertension, benign   . FH: TAH-BSO (total abdominal hysterectomy and bilateral salpingo-oophorectomy)   . Gout   . Hx of appendectomy   . Hyperlipidemia   . Kidney disease   . Osteopenia   . Paget's disease   . Pneumonia   . Vitamin D deficiency     Current Outpatient Medications:  .  albuterol (PROVENTIL HFA;VENTOLIN HFA) 108 (90 Base) MCG/ACT inhaler, Inhale 2 puffs into the lungs every 6 (six) hours as needed for wheezing or shortness of breath., Disp: 1 Inhaler, Rfl: 2 .  amLODipine (NORVASC) 10 MG tablet, Take 1 tablet (10 mg total) by mouth daily., Disp: 90 tablet, Rfl: 1 .  amoxicillin-clavulanate (AUGMENTIN) 875-125 MG tablet, Take 1 tablet by mouth 2 (two) times  daily for 10 days., Disp: 20 tablet, Rfl: 0 .  aspirin 81 MG tablet, Take 81 mg by mouth daily.  , Disp: , Rfl:  .  cholecalciferol (VITAMIN D) 1000 UNITS tablet, Take 1,000 Units by mouth daily., Disp: , Rfl:  .  fish oil-omega-3 fatty acids 1000 MG capsule, Take 2 g by mouth daily., Disp: , Rfl:  .  fluticasone (FLONASE) 50 MCG/ACT nasal spray, Place 1 spray 2 (two) times daily as needed into both nostrils for allergies or rhinitis., Disp: 16 g, Rfl: 6 .  Garlic 4818 MG CAPS, Take 1 capsule by mouth 2 (two) times daily. , Disp: , Rfl:  .  levocetirizine (XYZAL) 5 MG tablet, Take 1 tablet (5 mg total) by mouth every evening., Disp: 90 tablet, Rfl: 1 .  rosuvastatin (CRESTOR) 10 MG tablet, Take 1 tablet (10 mg total) by mouth daily., Disp: 90 tablet, Rfl: 1 .  TRELEGY ELLIPTA 100-62.5-25 MCG/INH AEPB, INHALE 1 PUFF ONCE DAILY, Disp: , Rfl: 12 Social History   Socioeconomic History  . Marital status: Widowed    Spouse name: Not on file  . Number of children: 2  . Years of education: cosemtology certificate  . Highest education level: Some college, no degree  Occupational History  . 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 Financial resource strain: Not hard at all  .  Food insecurity:    Worry: Never true    Inability: Never true  . Transportation needs:    Medical: No    Non-medical: No  Tobacco Use  . Smoking status: Former Smoker    Packs/day: 1.00    Years: 20.00    Pack years: 20.00    Types: Cigarettes    Last attempt to quit: 08/25/2009    Years since quitting: 8.4  . Smokeless tobacco: Never Used  Substance and Sexual Activity  . Alcohol use: No  . Drug use: No  . Sexual activity: Not Currently  Lifestyle  . Physical activity:    Days per week: 5 days    Minutes per session: 120 min  . Stress: Not at all  Relationships  . Social connections:    Talks on phone: More than three times a week    Gets together: More than three  times a week    Attends religious service: More than 4 times per year    Active member of club or organization: Yes    Attends meetings of clubs or organizations: More than 4 times per year    Relationship status: Widowed  . Intimate partner violence:    Fear of current or ex partner: Not on file    Emotionally abused: Not on file    Physically abused: Not on file    Forced sexual activity: Not on file  Other Topics Concern  . Not on file  Social History Narrative  . Not on file   Family History  Problem Relation Age of Onset  . Heart disease Mother   . Stroke Mother 51  . Heart disease Father   . Heart attack Father   . Hypertension Son   . Heart attack Paternal Aunt   . Heart disease Paternal Aunt     Objective: Office vital signs reviewed. BP 131/75   Pulse (!) 116   Temp (!) 97.5 F (36.4 C) (Oral)   Ht 5\' 2"  (1.575 m)   Wt 110 lb (49.9 kg)   SpO2 98%   BMI 20.12 kg/m   Physical Examination:  General: Awake, alert, nontoxic, coughing intermittently, No acute distress HEENT: Normal    Neck: No masses palpated. No lymphadenopathy    Ears: Tympanic membranes intact, normal light reflex, no erythema, no bulging    Eyes: PERRLA, extraocular membranes intact, sclera white    Nose: nasal turbinates moist, clear nasal discharge    Throat: moist mucus membranes, no erythema, no tonsillar exudate.  Airway is patent Cardio: regular rate and rhythm, S1S2 heard, no murmurs appreciated Pulm: Decreased breath sounds within the left middle lung fields, no wheezes, rhonchi or rales; normal work of breathing on room air  Dg Chest 2 View  Result Date: 01/19/2018 CLINICAL DATA:  Cough and congestion EXAM: CHEST - 2 VIEW COMPARISON:  November 10, 2017 FINDINGS: There is airspace consolidation throughout much of the left upper lobe and superior lingula. Consolidation is greatest in the left upper lobe, likely in an axillary subsegment. There is chronic apical scarring and thickening  bilaterally, slightly more on the right than on the left. Lungs elsewhere are clear. Heart size and pulmonary vascularity are normal. No adenopathy. Postoperative changes are noted in the left superior paratracheal region, stable. IMPRESSION: Extensive left upper lobe and superior lingula airspace opacity consistent with pneumonia. Stable scarring in the apices and periphery of the upper lobes. Stable cardiac silhouette. No adenopathy evident. Followup PA and lateral chest radiographs recommended in  3-4 weeks following trial of antibiotic therapy to ensure resolution and exclude underlying malignancy. These results will be called to the ordering clinician or representative by the Radiologist Assistant, and communication documented in the PACS or zVision Dashboard. Electronically Signed   By: Lowella Grip III M.D.   On: 01/19/2018 10:26    Assessment/ Plan: 72 y.o. female   1. Community acquired pneumonia of left upper lobe of lung ([place]) Patient is afebrile nontoxic-appearing.  Her pulse ox is 98% on room air with normal work of breathing.  Her physical exam was remarkable for decreased breath sounds within the left middle and lower lung field.  X-ray was obtained and upon personal review she has quite a large opacity within the upper portion of the left lower lobe which appears to be almost septated angle from the chest wall.  Curb 65 score of 1.  She is mildly tachycardic but does not meet sirs or sepsis criteria at this time.  I have started her on Levaquin 750 mg p.o. daily for the next 7 days.  Will check BMP and CBC.  We discussed that if her white cell count is elevated low threshold to recommend eval in the emergency department, particularly if symptoms are not improving on outpatient oral therapies.  Reasons for return and emergent evaluation emergency department discussed.  Patient was good understanding.  She will follow-up in 3 to 4 weeks for repeat chest x-ray, sooner if needed. - Basic  Metabolic Panel - CBC  2. Cough - DG Chest 2 View; Future   Orders Placed This Encounter  Procedures  . DG Chest 2 View    Standing Status:   Future    Number of Occurrences:   1    Standing Expiration Date:   03/22/2019    Order Specific Question:   Reason for Exam (SYMPTOM  OR DIAGNOSIS REQUIRED)    Answer:   cough.    Order Specific Question:   Preferred imaging location?    Answer:   Internal    Order Specific Question:   Radiology Contrast Protocol - do NOT remove file path    Answer:   \\[email or web address]s.pdf  . Basic Metabolic Panel  . CBC   Meds ordered this encounter  Medications  . levofloxacin (LEVAQUIN) 750 MG tablet    Sig: Take 1 tablet (750 mg total) by mouth daily for 7 days.    Dispense:  7 tablet    Refill:  0     Crosley Stejskal Windell Moulding, DO [place] [phone]

## 2018-01-20 ENCOUNTER — Telehealth: Payer: Self-pay | Admitting: Family Medicine

## 2018-01-20 LAB — BASIC METABOLIC PANEL
BUN / CREAT RATIO: 9 — AB (ref 12–28)
BUN: 19 mg/dL (ref 8–27)
CHLORIDE: 103 mmol/L (ref 96–106)
CO2: 20 mmol/L (ref 20–29)
Calcium: 8.7 mg/dL (ref 8.7–10.3)
Creatinine, Ser: 2.17 mg/dL — ABNORMAL HIGH (ref 0.57–1.00)
GFR, EST AFRICAN AMERICAN: 26 mL/min/{1.73_m2} — AB (ref >59)
GFR, EST NON AFRICAN AMERICAN: 22 mL/min/{1.73_m2} — AB (ref >59)
Glucose: 123 mg/dL — ABNORMAL HIGH (ref 65–99)
POTASSIUM: 4 mmol/L (ref 3.5–5.2)
SODIUM: 142 mmol/L (ref 134–144)

## 2018-01-20 LAB — CBC
Hematocrit: 30.4 % — ABNORMAL LOW (ref 34.0–46.6)
Hemoglobin: 9.9 g/dL — ABNORMAL LOW (ref 11.1–15.9)
MCH: 29.4 pg (ref 26.6–33.0)
MCHC: 32.6 g/dL (ref 31.5–35.7)
MCV: 90 fL (ref 79–97)
Platelets: 508 10*3/uL — ABNORMAL HIGH (ref 150–450)
RBC: 3.37 x10E6/uL — ABNORMAL LOW (ref 3.77–5.28)
RDW: 12.9 % (ref 12.3–15.4)
WBC: 11.9 10*3/uL — ABNORMAL HIGH (ref 3.4–10.8)

## 2018-01-20 NOTE — Telephone Encounter (Signed)
Spoke with pt told her that Dr. Darnell Level said it would be ok to stop Trelegy

## 2018-01-24 ENCOUNTER — Telehealth: Payer: Self-pay | Admitting: Family Medicine

## 2018-01-24 NOTE — Telephone Encounter (Signed)
Pt has an appt with Dr. Luan Pulling on Nov. 11, she will keep that one

## 2018-01-24 NOTE — Telephone Encounter (Signed)
PT states that she has had pneumonia twice over last few months and is feeling somewhat better but would like to go to a lung specialist would like to speak to [place] about it.

## 2018-02-21 ENCOUNTER — Ambulatory Visit (INDEPENDENT_AMBULATORY_CARE_PROVIDER_SITE_OTHER): Payer: Medicare PPO

## 2018-02-21 ENCOUNTER — Encounter: Payer: Self-pay | Admitting: Family Medicine

## 2018-02-21 ENCOUNTER — Other Ambulatory Visit: Payer: Self-pay | Admitting: Family Medicine

## 2018-02-21 ENCOUNTER — Ambulatory Visit: Payer: Medicare PPO | Admitting: Family Medicine

## 2018-02-21 VITALS — BP 132/69 | HR 98 | Temp 97.4°F | Ht 62.0 in | Wt 108.0 lb

## 2018-02-21 DIAGNOSIS — J181 Lobar pneumonia, unspecified organism: Secondary | ICD-10-CM

## 2018-02-21 DIAGNOSIS — Z23 Encounter for immunization: Secondary | ICD-10-CM

## 2018-02-21 DIAGNOSIS — J189 Pneumonia, unspecified organism: Secondary | ICD-10-CM

## 2018-02-21 DIAGNOSIS — R918 Other nonspecific abnormal finding of lung field: Secondary | ICD-10-CM

## 2018-02-21 DIAGNOSIS — E785 Hyperlipidemia, unspecified: Secondary | ICD-10-CM

## 2018-02-21 DIAGNOSIS — N184 Chronic kidney disease, stage 4 (severe): Secondary | ICD-10-CM | POA: Diagnosis not present

## 2018-02-21 DIAGNOSIS — J984 Other disorders of lung: Secondary | ICD-10-CM | POA: Diagnosis not present

## 2018-02-21 DIAGNOSIS — M8580 Other specified disorders of bone density and structure, unspecified site: Secondary | ICD-10-CM

## 2018-02-21 DIAGNOSIS — I10 Essential (primary) hypertension: Secondary | ICD-10-CM | POA: Diagnosis not present

## 2018-02-21 NOTE — Progress Notes (Signed)
Subjective: CC: LUL pna follow up PCP: [name], DO Diane Carlson is a 72 y.o. female presenting to clinic today for:  1. LUL PNA follow up Patient was seen on 01/19/2018 for a cough that have been ongoing for 7 days.  She was status post treatment with Augmentin and symptoms were worsening.  She was ultimately found to have a left upper lobe pneumonia.  She was placed on Levaquin and instructed to follow-up in 3 to 4 weeks for repeat chest x-ray.  She notes that she has been doing much better since treatment with Levaquin.  Denies any hemoptysis.  She has had a very mild cough secondary to drainage from her sinuses over the last several days.  Denies any associated shortness of breath, fevers, chest pain.  She is taking Xyzal and Flonase as directed.  She notes that she discontinued Trelegy because she attributes this to recurrent infections.  She has an appointment with pulmonology on 04/01/2018 with Dr. Luan Pulling.  2.  Hypertension /CKD/ Osteopenia Patient reports compliance with Norvasc.  No chest pain or shortness of breath.  She takes vitamin D OTC daily.  She has known osteopenia.  She is followed by Dr. Joelyn Oms for nephrology.  3.  Hyperlipidemia Patient reports compliance with Crestor 10 mg.  She keeps physically active.   ROS: Per HPI  Allergies  Allergen Reactions  . Cephalexin   . Pravastatin     Muscle aches  . Sulfa Antibiotics   . Zetia [Ezetimibe]     mylagias   Past Medical History:  Diagnosis Date  . Allergy   . Cholesteatoma   . Essential hypertension, benign   . FH: TAH-BSO (total abdominal hysterectomy and bilateral salpingo-oophorectomy)   . Gout   . Hx of appendectomy   . Hyperlipidemia   . Kidney disease   . Osteopenia   . Paget's disease   . Pneumonia   . Vitamin D deficiency     Current Outpatient Medications:  .  albuterol (PROVENTIL HFA;VENTOLIN HFA) 108 (90 Base) MCG/ACT inhaler, Inhale 2 puffs into the lungs every 6 (six) hours  as needed for wheezing or shortness of breath., Disp: 1 Inhaler, Rfl: 2 .  amLODipine (NORVASC) 10 MG tablet, Take 1 tablet (10 mg total) by mouth daily., Disp: 90 tablet, Rfl: 1 .  aspirin 81 MG tablet, Take 81 mg by mouth daily.  , Disp: , Rfl:  .  cholecalciferol (VITAMIN D) 1000 UNITS tablet, Take 1,000 Units by mouth daily., Disp: , Rfl:  .  fish oil-omega-3 fatty acids 1000 MG capsule, Take 2 g by mouth daily., Disp: , Rfl:  .  fluticasone (FLONASE) 50 MCG/ACT nasal spray, Place 1 spray 2 (two) times daily as needed into both nostrils for allergies or rhinitis., Disp: 16 g, Rfl: 6 .  Garlic 0272 MG CAPS, Take 1 capsule by mouth 2 (two) times daily. , Disp: , Rfl:  .  levocetirizine (XYZAL) 5 MG tablet, Take 1 tablet (5 mg total) by mouth every evening., Disp: 90 tablet, Rfl: 1 .  rosuvastatin (CRESTOR) 10 MG tablet, Take 1 tablet (10 mg total) by mouth daily., Disp: 90 tablet, Rfl: 1 Social History   Socioeconomic History  . Marital status: Widowed    Spouse name: Not on file  . Number of children: 2  . Years of education: cosemtology certificate  . Highest education level: Some college, no degree  Occupational History  . 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 Financial resource strain: Not hard at all  . Food insecurity:    Worry: Never true    Inability: Never true  . Transportation needs:    Medical: No    Non-medical: No  Tobacco Use  . Smoking status: Former Smoker    Packs/day: 1.00    Years: 20.00    Pack years: 20.00    Types: Cigarettes    Last attempt to quit: 08/25/2009    Years since quitting: 8.4  . Smokeless tobacco: Never Used  Substance and Sexual Activity  . Alcohol use: No  . Drug use: No  . Sexual activity: Not Currently  Lifestyle  . Physical activity:    Days per week: 5 days    Minutes per session: 120 min  . Stress: Not at all  Relationships  . Social connections:    Talks on phone: More than three  times a week    Gets together: More than three times a week    Attends religious service: More than 4 times per year    Active member of club or organization: Yes    Attends meetings of clubs or organizations: More than 4 times per year    Relationship status: Widowed  . Intimate partner violence:    Fear of current or ex partner: Not on file    Emotionally abused: Not on file    Physically abused: Not on file    Forced sexual activity: Not on file  Other Topics Concern  . Not on file  Social History Narrative  . Not on file   Family History  Problem Relation Age of Onset  . Heart disease Mother   . Stroke Mother 84  . Heart disease Father   . Heart attack Father   . Hypertension Son   . Heart attack Paternal Aunt   . Heart disease Paternal Aunt     Objective: Office vital signs reviewed. BP 132/69   Pulse 98   Temp (!) 97.4 F (36.3 C) (Oral)   Ht 5\' 2"  (1.575 m)   Wt 108 lb (49 kg)   BMI 19.75 kg/m   Physical Examination:  General: Awake, alert, well nourished, No acute distress HEENT: Normal    Neck: No masses palpated. No lymphadenopathy    Ears: Tympanic membranes intact, normal light reflex, no erythema, no bulging    Eyes: PERRLA, extraocular membranes intact, sclera white    Nose: nasal turbinates moist, clear nasal discharge    Throat: moist mucus membranes, no erythema, no tonsillar exudate.  Airway is patent Cardio: regular rate and rhythm, S1S2 heard, no murmurs appreciated Pulm: Slight decreased breath sounds in LUL. Otherwise, CTAB. no wheezes, rhonchi or rales; normal work of breathing on room air   Assessment/ Plan: 72 y.o. female   1. Pneumonia of left upper lobe due to infectious organism [facility]) Physical exam notable for slight decreased breath sounds in the left upper lung fields.  Repeat chest x-ray today now that we are status post 4 weeks from left upper lobe pneumonia.  If there is persistent opacity, we will plan for CT chest.  This was  discussed with the patient.  Check CBC given leukocytosis on last CBC. - CBC - DG Chest 2 View; Future  2. CKD (chronic kidney disease) stage 4, GFR 15-29 ml/min (HCC) Check BMP, vitamin D level and CBC. - CBC - Basic Metabolic Panel - VITAMIN D 25 Hydroxy (Vit-D Deficiency, Fractures)  3. Essential hypertension, benign Well-controlled.  Continue current regimen. - Basic Metabolic Panel  4. Osteopenia, unspecified location Check vitamin D - VITAMIN D 25 Hydroxy (Vit-D Deficiency, Fractures)  5. Hyperlipidemia, unspecified hyperlipidemia type Patient is fasting.  And compliant with Crestor. - Lipid Panel   Orders Placed This Encounter  Procedures  . DG Chest 2 View    Standing Status:   Future    Standing Expiration Date:   04/24/2019    Order Specific Question:   Reason for Exam (SYMPTOM  OR DIAGNOSIS REQUIRED)    Answer:   f/u LUL Pneumonia.    Order Specific Question:   Preferred imaging location?    Answer:   Internal    Order Specific Question:   Radiology Contrast Protocol - do NOT remove file path    Answer:   \\[email or web address]\[email or web address]s.pdf  . CBC  . Basic Metabolic Panel  . VITAMIN D 25 Hydroxy (Vit-D Deficiency, Fractures)  . Lipid Panel   No orders of the defined types were placed in this encounter.    [name], DO [place] [phone]

## 2018-02-21 NOTE — Patient Instructions (Signed)
You had labs performed today.  You will be contacted with the results of the labs once they are available, usually in the next 3 business days for routine lab work.    I will call you with the result of your chest xray.

## 2018-02-22 ENCOUNTER — Telehealth: Payer: Self-pay | Admitting: Family Medicine

## 2018-02-22 LAB — LIPID PANEL
CHOL/HDL RATIO: 2.4 ratio (ref 0.0–4.4)
CHOLESTEROL TOTAL: 127 mg/dL (ref 100–199)
HDL: 53 mg/dL (ref >39)
LDL Calculated: 48 mg/dL (ref 0–99)
TRIGLYCERIDES: 131 mg/dL (ref 0–149)
VLDL Cholesterol Cal: 26 mg/dL (ref 5–40)

## 2018-02-22 LAB — BASIC METABOLIC PANEL
BUN/Creatinine Ratio: 9 — ABNORMAL LOW (ref 12–28)
BUN: 21 mg/dL (ref 8–27)
CALCIUM: 9.7 mg/dL (ref 8.7–10.3)
CHLORIDE: 105 mmol/L (ref 96–106)
CO2: 18 mmol/L — ABNORMAL LOW (ref 20–29)
Creatinine, Ser: 2.29 mg/dL — ABNORMAL HIGH (ref 0.57–1.00)
GFR calc non Af Amer: 21 mL/min/{1.73_m2} — ABNORMAL LOW (ref >59)
GFR, EST AFRICAN AMERICAN: 24 mL/min/{1.73_m2} — AB (ref >59)
Glucose: 102 mg/dL — ABNORMAL HIGH (ref 65–99)
Potassium: 4.5 mmol/L (ref 3.5–5.2)
Sodium: 141 mmol/L (ref 134–144)

## 2018-02-22 LAB — CBC
HEMOGLOBIN: 10.3 g/dL — AB (ref 11.1–15.9)
Hematocrit: 30.8 % — ABNORMAL LOW (ref 34.0–46.6)
MCH: 28.8 pg (ref 26.6–33.0)
MCHC: 33.4 g/dL (ref 31.5–35.7)
MCV: 86 fL (ref 79–97)
PLATELETS: 337 10*3/uL (ref 150–450)
RBC: 3.58 x10E6/uL — ABNORMAL LOW (ref 3.77–5.28)
RDW: 12.8 % (ref 12.3–15.4)
WBC: 9 10*3/uL (ref 3.4–10.8)

## 2018-02-22 LAB — VITAMIN D 25 HYDROXY (VIT D DEFICIENCY, FRACTURES): Vit D, 25-Hydroxy: 33.1 ng/mL (ref 30.0–100.0)

## 2018-02-22 NOTE — Telephone Encounter (Signed)
[email or web address]

## 2018-02-28 NOTE — Telephone Encounter (Signed)
Patient aware of all lab results on 02/23/18

## 2018-03-04 ENCOUNTER — Ambulatory Visit (HOSPITAL_COMMUNITY)
Admission: RE | Admit: 2018-03-04 | Discharge: 2018-03-04 | Disposition: A | Discharge status: Home / Self Care | Payer: Medicare PPO | Source: Ambulatory Visit | Attending: Family Medicine | Admitting: Family Medicine

## 2018-03-04 DIAGNOSIS — J189 Pneumonia, unspecified organism: Secondary | ICD-10-CM

## 2018-03-04 DIAGNOSIS — J439 Emphysema, unspecified: Secondary | ICD-10-CM | POA: Diagnosis not present

## 2018-03-04 DIAGNOSIS — R918 Other nonspecific abnormal finding of lung field: Secondary | ICD-10-CM | POA: Diagnosis not present

## 2018-03-04 DIAGNOSIS — J181 Lobar pneumonia, unspecified organism: Secondary | ICD-10-CM | POA: Diagnosis not present

## 2018-03-07 ENCOUNTER — Other Ambulatory Visit: Payer: Medicare PPO

## 2018-03-07 DIAGNOSIS — N184 Chronic kidney disease, stage 4 (severe): Secondary | ICD-10-CM | POA: Diagnosis not present

## 2018-03-07 DIAGNOSIS — E559 Vitamin D deficiency, unspecified: Secondary | ICD-10-CM | POA: Diagnosis not present

## 2018-03-08 LAB — CBC WITH DIFFERENTIAL/PLATELET
BASOS ABS: 0.1 10*3/uL (ref 0.0–0.2)
Basos: 1 %
EOS (ABSOLUTE): 0.2 10*3/uL (ref 0.0–0.4)
EOS: 2 %
HEMATOCRIT: 30.7 % — AB (ref 34.0–46.6)
HEMOGLOBIN: 10 g/dL — AB (ref 11.1–15.9)
IMMATURE GRANS (ABS): 0 10*3/uL (ref 0.0–0.1)
Immature Granulocytes: 0 %
LYMPHS ABS: 1.8 10*3/uL (ref 0.7–3.1)
LYMPHS: 21 %
MCH: 29 pg (ref 26.6–33.0)
MCHC: 32.6 g/dL (ref 31.5–35.7)
MCV: 89 fL (ref 79–97)
MONOCYTES: 7 %
Monocytes Absolute: 0.6 10*3/uL (ref 0.1–0.9)
NEUTROS ABS: 5.9 10*3/uL (ref 1.4–7.0)
Neutrophils: 69 %
Platelets: 320 10*3/uL (ref 150–450)
RBC: 3.45 x10E6/uL — ABNORMAL LOW (ref 3.77–5.28)
RDW: 13.7 % (ref 12.3–15.4)
WBC: 8.6 10*3/uL (ref 3.4–10.8)

## 2018-03-08 LAB — RENAL FUNCTION PANEL
Albumin: 4.4 g/dL (ref 3.5–4.8)
BUN/Creatinine Ratio: 9 — ABNORMAL LOW (ref 12–28)
BUN: 19 mg/dL (ref 8–27)
CALCIUM: 9.2 mg/dL (ref 8.7–10.3)
CHLORIDE: 103 mmol/L (ref 96–106)
CO2: 17 mmol/L — AB (ref 20–29)
Creatinine, Ser: 2.06 mg/dL — ABNORMAL HIGH (ref 0.57–1.00)
GFR calc non Af Amer: 24 mL/min/{1.73_m2} — ABNORMAL LOW (ref >59)
GFR, EST AFRICAN AMERICAN: 27 mL/min/{1.73_m2} — AB (ref >59)
GLUCOSE: 126 mg/dL — AB (ref 65–99)
POTASSIUM: 4.7 mmol/L (ref 3.5–5.2)
Phosphorus: 4.4 mg/dL (ref 2.5–4.5)
SODIUM: 141 mmol/L (ref 134–144)

## 2018-03-08 LAB — PTH, INTACT AND CALCIUM
Calcium: 9.1 mg/dL (ref 8.7–10.3)
PTH: 114 pg/mL — ABNORMAL HIGH (ref 15–65)

## 2018-03-08 LAB — VITAMIN D 25 HYDROXY (VIT D DEFICIENCY, FRACTURES): VIT D 25 HYDROXY: 30.6 ng/mL (ref 30.0–100.0)

## 2018-03-09 ENCOUNTER — Telehealth: Payer: Self-pay | Admitting: Family Medicine

## 2018-03-09 DIAGNOSIS — R918 Other nonspecific abnormal finding of lung field: Secondary | ICD-10-CM

## 2018-03-09 NOTE — Telephone Encounter (Signed)
I reviewed patient's CAT scan results with her.  There is a nonspecific density in the left upper lobe that radiologist reports is likely resolving inflammation or scarring.  There is another small solid area essentially that requires follow-up evaluation in 3 to 6 months.  I will order a noncontrast CT to be scheduled in 4 to 5 months.  Of course, patient may return sooner if she develops any symptoms of concern.  She has known COPD/emphysema and is under the care of pulmonology for this.  Ct Chest Wo Contrast  Result Date: 03/07/2018 CLINICAL DATA:  Shortness of breath. Abnormal x-ray. Evaluate for malignancy. History of recurrent pneumonia. No history of malignancy. EXAM: CT CHEST WITHOUT CONTRAST TECHNIQUE: Multidetector CT imaging of the chest was performed following the standard protocol without IV contrast. COMPARISON:  Radiographs 02/21/2018 and 01/19/2018. Abdominal CT 01/20/2016. FINDINGS: Cardiovascular: Fairly extensive coronary artery atherosclerosis with slightly lesser involvement of the thoracic aorta and great vessels. No acute vascular findings on noncontrast imaging. The heart size is normal. There is no pericardial effusion. Mediastinum/Nodes: There are no enlarged mediastinal, hilar or axillary lymph nodes.There are scattered small mediastinal lymph nodes. There is mild thyroid nodularity, measuring up to 12 mm in the right isthmus (image 7/2), unlikely to be clinically significant and below threshold for additional imaging. There are surgical clips along the left thoracic inlet. The trachea and esophagus appear normal. Lungs/Pleura: There is no pleural effusion or pneumothorax. There is moderate to severe centrilobular emphysema with partially calcified biapical subpleural scarring. There is persistent residual subpleural opacity peripherally in the left upper lobe as seen on recent radiographs. This is predominately ground-glass and ill-defined, although there is a central solid component  measuring up to 8 x 7 mm on image 55/4. No other focal pulmonary nodules. Upper abdomen: Mild nodularity of the left adrenal gland is stable from the prior abdominal CT. The visualized upper abdomen is otherwise unremarkable. Musculoskeletal/Chest wall: There is no chest wall mass or suspicious osseous finding. IMPRESSION: 1. Nonspecific subpleural density peripherally in the left upper lobe, at site of more consolidative airspace disease on radiographs of 6 weeks ago, most likely resolving inflammation or postinflammatory scarring. There is an associated small solid component centrally. Recommend noncontrast chest CT in 3-6 months to exclude atypical malignancy. 2. Moderate to severe centrilobular emphysema with calcified biapical subpleural scarring. Emphysema (ICD10-J43.9). 3. Coronary and Aortic Atherosclerosis (ICD10-I70.0). Electronically Signed   By: Richardean Sale M.D.   On: 03/07/2018 10:13   Nykia Turko M. Lajuana Ripple, [facility]

## 2018-03-10 DIAGNOSIS — I129 Hypertensive chronic kidney disease with stage 1 through stage 4 chronic kidney disease, or unspecified chronic kidney disease: Secondary | ICD-10-CM | POA: Diagnosis not present

## 2018-03-10 DIAGNOSIS — D631 Anemia in chronic kidney disease: Secondary | ICD-10-CM | POA: Diagnosis not present

## 2018-03-10 DIAGNOSIS — N2581 Secondary hyperparathyroidism of renal origin: Secondary | ICD-10-CM | POA: Diagnosis not present

## 2018-03-10 DIAGNOSIS — N184 Chronic kidney disease, stage 4 (severe): Secondary | ICD-10-CM | POA: Diagnosis not present

## 2018-03-18 ENCOUNTER — Ambulatory Visit: Payer: Medicare PPO | Admitting: Family Medicine

## 2018-04-01 DIAGNOSIS — J189 Pneumonia, unspecified organism: Secondary | ICD-10-CM | POA: Diagnosis not present

## 2018-04-01 DIAGNOSIS — I10 Essential (primary) hypertension: Secondary | ICD-10-CM | POA: Diagnosis not present

## 2018-04-01 DIAGNOSIS — J301 Allergic rhinitis due to pollen: Secondary | ICD-10-CM | POA: Diagnosis not present

## 2018-04-01 DIAGNOSIS — J449 Chronic obstructive pulmonary disease, unspecified: Secondary | ICD-10-CM | POA: Diagnosis not present

## 2018-04-04 DIAGNOSIS — Z1231 Encounter for screening mammogram for malignant neoplasm of breast: Secondary | ICD-10-CM | POA: Diagnosis not present

## 2018-04-04 LAB — HM MAMMOGRAPHY: HM MAMMO: ABNORMAL — AB (ref 0–4)

## 2018-04-07 ENCOUNTER — Telehealth: Payer: Self-pay

## 2018-04-07 NOTE — Telephone Encounter (Signed)
I was told I needed additional images of left breast   Don't want to go to [place]  Can I go to [facility] for this?

## 2018-04-08 ENCOUNTER — Telehealth: Payer: Self-pay | Admitting: Family Medicine

## 2018-04-08 NOTE — Telephone Encounter (Signed)
PT had Mammogram on the mobile unit Monday saw something and needs additional testing, would like to go to [facility] for the additional testing.

## 2018-04-12 ENCOUNTER — Other Ambulatory Visit: Payer: Self-pay | Admitting: Family Medicine

## 2018-04-12 DIAGNOSIS — R921 Mammographic calcification found on diagnostic imaging of breast: Secondary | ICD-10-CM

## 2018-04-12 NOTE — Telephone Encounter (Signed)
Pt aware of appointment date/time-[facility]

## 2018-04-12 NOTE — Telephone Encounter (Signed)
Pt aware of appointment date.time at [place] requested

## 2018-05-03 ENCOUNTER — Ambulatory Visit (HOSPITAL_COMMUNITY)
Admission: RE | Admit: 2018-05-03 | Discharge: 2018-05-03 | Disposition: A | Discharge status: Home / Self Care | Payer: Medicare PPO | Source: Ambulatory Visit | Attending: Family Medicine | Admitting: Family Medicine

## 2018-05-03 ENCOUNTER — Ambulatory Visit (HOSPITAL_COMMUNITY): Payer: Medicare PPO

## 2018-05-03 DIAGNOSIS — R921 Mammographic calcification found on diagnostic imaging of breast: Secondary | ICD-10-CM | POA: Insufficient documentation

## 2018-05-05 ENCOUNTER — Telehealth: Payer: Self-pay

## 2018-05-05 NOTE — Telephone Encounter (Signed)
Carlon is working on this for the patient. I will cc chart to her.

## 2018-05-05 NOTE — Telephone Encounter (Signed)
Told me at [facility] that I needed a BX   When will that be set up?

## 2018-05-06 ENCOUNTER — Other Ambulatory Visit: Payer: Self-pay | Admitting: Family Medicine

## 2018-05-06 DIAGNOSIS — R921 Mammographic calcification found on diagnostic imaging of breast: Secondary | ICD-10-CM

## 2018-05-06 NOTE — Telephone Encounter (Signed)
[facility] to x-ray Appointment at the [place] 05/12/2018 at 9:30am. Arrive at 9:10 for registration; no prep other than usual mammo prep

## 2018-05-06 NOTE — Telephone Encounter (Signed)
Pt aware of appointment date/time 

## 2018-05-12 ENCOUNTER — Ambulatory Visit
Admission: RE | Admit: 2018-05-12 | Discharge: 2018-05-12 | Disposition: A | Discharge status: Home / Self Care | Payer: Medicare PPO | Source: Ambulatory Visit | Attending: Family Medicine | Admitting: Family Medicine

## 2018-05-12 DIAGNOSIS — R921 Mammographic calcification found on diagnostic imaging of breast: Secondary | ICD-10-CM

## 2018-05-12 DIAGNOSIS — D0512 Intraductal carcinoma in situ of left breast: Secondary | ICD-10-CM | POA: Diagnosis not present

## 2018-05-16 ENCOUNTER — Encounter: Payer: Self-pay | Admitting: General Surgery

## 2018-05-16 ENCOUNTER — Other Ambulatory Visit: Payer: Self-pay | Admitting: General Surgery

## 2018-05-16 DIAGNOSIS — Z9889 Other specified postprocedural states: Secondary | ICD-10-CM | POA: Diagnosis not present

## 2018-05-16 DIAGNOSIS — Z9049 Acquired absence of other specified parts of digestive tract: Secondary | ICD-10-CM | POA: Diagnosis not present

## 2018-05-16 DIAGNOSIS — D0512 Intraductal carcinoma in situ of left breast: Secondary | ICD-10-CM | POA: Diagnosis not present

## 2018-05-16 DIAGNOSIS — E785 Hyperlipidemia, unspecified: Secondary | ICD-10-CM | POA: Diagnosis not present

## 2018-05-16 DIAGNOSIS — I10 Essential (primary) hypertension: Secondary | ICD-10-CM | POA: Diagnosis not present

## 2018-05-16 DIAGNOSIS — C50112 Malignant neoplasm of central portion of left female breast: Secondary | ICD-10-CM

## 2018-05-16 DIAGNOSIS — Z90710 Acquired absence of both cervix and uterus: Secondary | ICD-10-CM | POA: Diagnosis not present

## 2018-05-16 DIAGNOSIS — Z9079 Acquired absence of other genital organ(s): Secondary | ICD-10-CM | POA: Diagnosis not present

## 2018-05-16 DIAGNOSIS — Z170 Estrogen receptor positive status [ER+]: Secondary | ICD-10-CM

## 2018-05-16 DIAGNOSIS — Z90722 Acquired absence of ovaries, bilateral: Secondary | ICD-10-CM | POA: Diagnosis not present

## 2018-05-19 DIAGNOSIS — E785 Hyperlipidemia, unspecified: Secondary | ICD-10-CM | POA: Diagnosis not present

## 2018-05-19 DIAGNOSIS — J301 Allergic rhinitis due to pollen: Secondary | ICD-10-CM | POA: Diagnosis not present

## 2018-05-19 DIAGNOSIS — Z681 Body mass index (BMI) 19 or less, adult: Secondary | ICD-10-CM | POA: Diagnosis not present

## 2018-05-19 DIAGNOSIS — I10 Essential (primary) hypertension: Secondary | ICD-10-CM | POA: Diagnosis not present

## 2018-05-20 NOTE — Pre-Procedure Instructions (Addendum)
[place]  05/20/2018      [facility] [place], [place] [address] Phone: [phone] Fax: [phone]    Your procedure is scheduled on May 31, 2018.  Report to [facility] Entrance "A" at 730 AM.  Call this number if you have problems the morning of surgery:  [phone]   Remember:  Do not eat after midnight.  You may drink clear liquids until 0630 AM.  Clear liquids allowed are:     Water, Juice (non-citric and without pulp), Clear Tea, Black Coffee only and Gatorade    Take these medicines the morning of surgery with A SIP OF WATER  amLODipine (NORVASC)  fluticasone (FLONASE) 50 MCG/ACT nasal spray-if needed albuterol (PROVENTIL HFA;VENTOLIN HFA) inhaler-if needed- bring inhaler with you  Follow your surgeon's instructions on when to hold/resume aspirin.  If no instructions were given call the office to determine how they would like to you take aspirin   7 days prior to surgery STOP taking any Aleve, Naproxen, Ibuprofen, Motrin, Advil, Goody's, BC's, all herbal medications, fish oil, and all vitamins.   Do not wear jewelry, make-up or nail polish.  Do not wear lotions, powders, or perfumes, or deodorant.  Do not shave 48 hours prior to surgery.  Men may shave face and neck.  Do not bring valuables to the hospital.  [facility] is not responsible for any belongings or valuables.  Contacts, dentures or bridgework may not be worn into surgery.  Leave your suitcase in the car.  After surgery it may be brought to your room.  For patients admitted to the hospital, discharge time will be determined by your treatment team.  Patients discharged the day of surgery will not be allowed to drive home.    [place]- Preparing For Surgery  Before surgery, you can play an important role. Because skin is not sterile, your skin needs to be as free of germs as possible. You can reduce the number of germs  on your skin by washing with CHG (chlorahexidine gluconate) Soap before surgery.  CHG is an antiseptic cleaner which kills germs and bonds with the skin to continue killing germs even after washing.    Oral Hygiene is also important to reduce your risk of infection.  Remember - BRUSH YOUR TEETH THE MORNING OF SURGERY WITH YOUR REGULAR TOOTHPASTE  Please do not use if you have an allergy to CHG or antibacterial soaps. If your skin becomes reddened/irritated stop using the CHG.  Do not shave (including legs and underarms) for at least 48 hours prior to first CHG shower. It is OK to shave your face.  Please follow these instructions carefully.   1. Shower the NIGHT BEFORE SURGERY and the MORNING OF SURGERY with CHG.   2. If you chose to wash your hair, wash your hair first as usual with your normal shampoo.  3. After you shampoo, rinse your hair and body thoroughly to remove the shampoo.  4. Use CHG as you would any other liquid soap. You can apply CHG directly to the skin and wash gently with a scrungie or a clean washcloth.   5. Apply the CHG Soap to your body ONLY FROM THE NECK DOWN.  Do not use on open wounds or open sores. Avoid contact with your eyes, ears, mouth and genitals (private parts). Wash Face and genitals (private parts)  with your normal soap.  6. Wash thoroughly,  paying special attention to the area where your surgery will be performed.  7. Thoroughly rinse your body with warm water from the neck down.  8. DO NOT shower/wash with your normal soap after using and rinsing off the CHG Soap.  9. Pat yourself dry with a CLEAN TOWEL.  10. Wear CLEAN PAJAMAS to bed the night before surgery, wear comfortable clothes the morning of surgery  11. Place CLEAN SHEETS on your bed the night of your first shower and DO NOT SLEEP WITH PETS.  Day of Surgery:  Do not apply any deodorants/lotions.  Please wear clean clothes to the hospital/surgery center.   Remember to brush your teeth  WITH YOUR REGULAR TOOTHPASTE.  Please read over the following fact sheets that you were given.

## 2018-05-23 ENCOUNTER — Ambulatory Visit: Payer: Medicare PPO | Admitting: Family Medicine

## 2018-05-23 ENCOUNTER — Other Ambulatory Visit: Payer: Self-pay

## 2018-05-23 ENCOUNTER — Ambulatory Visit (INDEPENDENT_AMBULATORY_CARE_PROVIDER_SITE_OTHER): Payer: Medicare PPO

## 2018-05-23 ENCOUNTER — Encounter (HOSPITAL_COMMUNITY)
Admission: RE | Admit: 2018-05-23 | Discharge: 2018-05-23 | Disposition: A | Discharge status: Home / Self Care | Payer: Medicare PPO | Source: Ambulatory Visit | Attending: General Surgery | Admitting: General Surgery

## 2018-05-23 ENCOUNTER — Encounter: Payer: Self-pay | Admitting: Family Medicine

## 2018-05-23 ENCOUNTER — Telehealth: Payer: Self-pay | Admitting: Family Medicine

## 2018-05-23 ENCOUNTER — Encounter (HOSPITAL_COMMUNITY): Payer: Self-pay

## 2018-05-23 VITALS — BP 139/74 | HR 87 | Temp 97.5°F | Ht 62.0 in | Wt 111.0 lb

## 2018-05-23 DIAGNOSIS — Z01812 Encounter for preprocedural laboratory examination: Secondary | ICD-10-CM | POA: Diagnosis not present

## 2018-05-23 DIAGNOSIS — N63 Unspecified lump in unspecified breast: Secondary | ICD-10-CM | POA: Diagnosis not present

## 2018-05-23 DIAGNOSIS — N184 Chronic kidney disease, stage 4 (severe): Secondary | ICD-10-CM | POA: Diagnosis not present

## 2018-05-23 DIAGNOSIS — Z01818 Encounter for other preprocedural examination: Secondary | ICD-10-CM | POA: Diagnosis not present

## 2018-05-23 DIAGNOSIS — Z01811 Encounter for preprocedural respiratory examination: Secondary | ICD-10-CM | POA: Diagnosis not present

## 2018-05-23 HISTORY — DX: Cerebral infarction, unspecified: I63.9

## 2018-05-23 HISTORY — DX: Malignant (primary) neoplasm, unspecified: C80.1

## 2018-05-23 HISTORY — DX: Adverse effect of unspecified anesthetic, initial encounter: T41.45XA

## 2018-05-23 HISTORY — DX: Other complications of anesthesia, initial encounter: T88.59XA

## 2018-05-23 LAB — CBC WITH DIFFERENTIAL/PLATELET
Abs Immature Granulocytes: 0.05 10*3/uL (ref 0.00–0.07)
Basophils Absolute: 0.1 10*3/uL (ref 0.0–0.1)
Basophils Relative: 1 %
Eosinophils Absolute: 0.3 10*3/uL (ref 0.0–0.5)
Eosinophils Relative: 4 %
HCT: 36.6 % (ref 36.0–46.0)
Hemoglobin: 11.5 g/dL — ABNORMAL LOW (ref 12.0–15.0)
Immature Granulocytes: 1 %
Lymphocytes Relative: 17 %
Lymphs Abs: 1.4 10*3/uL (ref 0.7–4.0)
MCH: 28.8 pg (ref 26.0–34.0)
MCHC: 31.4 g/dL (ref 30.0–36.0)
MCV: 91.5 fL (ref 80.0–100.0)
Monocytes Absolute: 0.7 10*3/uL (ref 0.1–1.0)
Monocytes Relative: 8 %
Neutro Abs: 5.9 10*3/uL (ref 1.7–7.7)
Neutrophils Relative %: 69 %
Platelets: 307 10*3/uL (ref 150–400)
RBC: 4 MIL/uL (ref 3.87–5.11)
RDW: 13 % (ref 11.5–15.5)
WBC: 8.4 10*3/uL (ref 4.0–10.5)
nRBC: 0 % (ref 0.0–0.2)

## 2018-05-23 LAB — COMPREHENSIVE METABOLIC PANEL
ALT: 15 U/L (ref 0–44)
AST: 20 U/L (ref 15–41)
Albumin: 4 g/dL (ref 3.5–5.0)
Alkaline Phosphatase: 214 U/L — ABNORMAL HIGH (ref 38–126)
Anion gap: 8 (ref 5–15)
BUN: 20 mg/dL (ref 8–23)
CO2: 20 mmol/L — ABNORMAL LOW (ref 22–32)
Calcium: 9.5 mg/dL (ref 8.9–10.3)
Chloride: 111 mmol/L (ref 98–111)
Creatinine, Ser: 2.07 mg/dL — ABNORMAL HIGH (ref 0.44–1.00)
GFR calc Af Amer: 27 mL/min — ABNORMAL LOW (ref >60)
GFR, EST NON AFRICAN AMERICAN: 24 mL/min — AB (ref >60)
Glucose, Bld: 104 mg/dL — ABNORMAL HIGH (ref 70–99)
Potassium: 4.8 mmol/L (ref 3.5–5.1)
Sodium: 139 mmol/L (ref 135–145)
Total Bilirubin: 0.4 mg/dL (ref 0.3–1.2)
Total Protein: 7.5 g/dL (ref 6.5–8.1)

## 2018-05-23 NOTE — Progress Notes (Signed)
Pt is a 72 y.o. female who is here for preoperative clearance for lumpectomy with Dr Dalbert Batman, scheduled for March.  She denies chest pain.  She reports occasional shortness of breath that she describes as a "stuck" sensation in her chest sometimes (usually when anxious). No wheezing, hemoptysis.  PMH significant for COPD, followed by Dr Luan Pulling and CKD4 followed by Dr Joelyn Oms.  1) High Risk Cardiac Conditions  1) Recent MI - No.  2) Decompensated Heart Failure - No.  3) Unstable angina - No.  4) Symptomatic arrythmia - No.  5) Sx Valvular Disease - No.  2) Intermediate Risk Factors - DM, CKD, CVA, CHF, CAD - No. (had a possible TIA in past)  2) Functional Status - > 4 mets (Walk, run, climb stairs) Yes.  Rob Hickman Activity Status Index: 32.95  3) Surgery Specific Risk - Low (Breast)  4) Further Noninvasive evaluation -   1) EKG - No.   1) Hx of CVA, CAD, DM, CKD  2) Echo - No.   1) Worsening dyspnea   3) Stress Testing - Active Cardiac Disease - No.  5) Need for medical therapy - Beta Blocker, Statins indicated ? No.  PE: Vitals:   05/23/18 1636  BP: 139/74  Pulse: 87  Temp: (!) 97.5 F (36.4 C)   Physical Examination: General appearance - alert, well appearing, and in no distress and oriented to person, place, and time Mental status - normal mood, behavior, speech, dress, motor activity, and thought processes Eyes - pupils equal and reactive, extraocular eye movements intact, sclera anicteric Mouth - mucous membranes moist, pharynx normal without lesions and Mallampati 3 Neck - supple, no significant adenopathy, has full active range of motion Chest - clear to auscultation, no wheezes, rales or rhonchi, symmetric air entry Heart - normal rate, regular rhythm, normal S1, S2, no murmurs, rubs, clicks or gallops  No results found.  1. Pre-op exam I have independently evaluated patient.  Diane Carlson is a 72 y.o. female who is low risk for a low risk surgery.  There are not  modifiable risk factors (smoking, etc). Diane Carlson's RCRI calculation for MACE is: 1 for Cr >2.0 (6% risk).    2. Pre-op chest exam Chest x-ray without acute pulmonary infiltrates.  Awaiting formal review by radiology.  Of note, she had CT chest performed in December which did note moderate to severe centrilobular emphysema and nonspecific left upper lobe subpleural density with associated small solid component centrally.  There are plans for repeat CT chest in April for follow-up on this to exclude atypical malignancy.  The "shortness of breath" that she had described was actually a globus sensation that she has when she is anxious.  Her pulse ox on room air was normal and pulmonary exam was unremarkable today. - DG Chest 2 View; Future  3. CKD (chronic kidney disease) stage 4, GFR 15-29 ml/min (HCC) It appears that Dr. Dalbert Batman obtained blood labs today which shows stability of her creatinine.  She is at her baseline renal function.  4. Breast mass in female Plans for lumpectomy in March.  Diane Carlson, [facility]

## 2018-05-23 NOTE — Telephone Encounter (Signed)
Ok to double book my 96 today for pre-op exam.

## 2018-05-23 NOTE — Telephone Encounter (Signed)
Patient aware and appt made 

## 2018-05-23 NOTE — Patient Instructions (Signed)
I will send your pre-op notes to Dr Dalbert Batman.  I did not hear a murmur on your exam today.

## 2018-05-23 NOTE — Telephone Encounter (Signed)
No opening for surgical clearance this week with Dr. Lajuana Ripple

## 2018-05-23 NOTE — Progress Notes (Signed)
PCP - Ronnie Doss, DO Cardiologist - denies  Chest x-ray -02/21/18  EKG - 09/27/17 Stress Test -denies  ECHO - denies Cardiac Cath - denies  Sleep Study - denies  Blood Thinner Instructions: N/A Aspirin Instructions:No instructions given, pt will call to get instructions.   Anesthesia review: Yes. Pt states that she has had increasing SOB since having PNA in Oct 19' and Nov 19'. Pt also states a nurse at Dr. Darrel Hoover office noticed that she might have a slight heart murmur. Hx of slight COPD. Notified Dr. Ermalene Postin and [place], [place]. PA assessed pt's lungs and heart in PAT. Advised pt to be evaluated by Dr. Lajuana Ripple at earliest convenience  to see if pt needs to be evaluated further before surgery date. Pt verbalizes understanding.   Patient denies fever, cough and chest pain at PAT appointment   Patient verbalized understanding of instructions that were given to them at the PAT appointment. Patient was also instructed that they will need to review over the PAT instructions again at home before surgery.

## 2018-05-24 ENCOUNTER — Telehealth: Payer: Self-pay | Admitting: Family Medicine

## 2018-05-24 NOTE — Anesthesia Preprocedure Evaluation (Addendum)
Anesthesia Evaluation  Patient identified by MRN, date of birth, ID band Patient awake    Reviewed: Allergy & Precautions, NPO status , Patient's Chart, lab work & pertinent test results  History of Anesthesia Complications Negative for: history of anesthetic complications  Airway Mallampati: I  TM Distance: >3 FB Neck ROM: Full    Dental  (+) Edentulous Upper, Edentulous Lower   Pulmonary former smoker (quit 2011),    breath sounds clear to auscultation       Cardiovascular hypertension, Pt. on medications + Peripheral Vascular Disease (s/p CEA)   Rhythm:Regular Rate:Normal     Neuro/Psych TIA   GI/Hepatic negative GI ROS, Neg liver ROS,   Endo/Other    Renal/GU Renal InsufficiencyRenal disease (creat 2.01)     Musculoskeletal   Abdominal   Peds  Hematology negative hematology ROS (+)   Anesthesia Other Findings Breast cancer  Reproductive/Obstetrics                            Anesthesia Physical Anesthesia Plan  ASA: III  Anesthesia Plan: General   Post-op Pain Management:    Induction: Intravenous  PONV Risk Score and Plan: 3 and Ondansetron, Dexamethasone and Treatment may vary due to age or medical condition  Airway Management Planned: LMA  Additional Equipment:   Intra-op Plan:   Post-operative Plan:   Informed Consent: I have reviewed the patients History and Physical, chart, labs and discussed the procedure including the risks, benefits and alternatives for the proposed anesthesia with the patient or authorized representative who has indicated his/her understanding and acceptance.     Dental advisory given  Plan Discussed with: CRNA and Surgeon  Anesthesia Plan Comments: (Pt reported intermittent SOB at PAT. She was advised to see PCP. Seen by Dr. Lajuana Ripple 05/23/18 and cleared for surgery. Per her note "Diane Carlson is a 73 y.o. female who is low risk for a  low risk surgery.  There are not modifiable risk factors (smoking, etc). Autym Hecks Willard's RCRI calculation for MACE is: 1 for Cr >2.0 (6% risk).[name] KitchenMarland KitchenThe "shortness of breath" that she had described was actually a globus sensation that she has when she is anxious.  Her pulse ox on room air was normal and pulmonary exam was unremarkable today.[name] KitchenMarland KitchenMarland KitchenIt appears that Dr. Dalbert Batman obtained blood labs today which shows stability of her creatinine.  She is at her baseline renal function."  Plan routine monitors, GA- LMA OK )       Anesthesia Quick Evaluation

## 2018-05-24 NOTE — Telephone Encounter (Signed)
Patient aware of xray results.   

## 2018-05-29 NOTE — H&P (Signed)
Diane Carlson Location: [facility] Patient #: [id number] DOB: 11/03/1945 Widowed / Language: [place] / Race: White Female       History of Present Illness      . This is a 73 year old woman from [place]. She is referred by Bary Richard at the [facility] and her PCP, Doylene Canard, for surgical management of ductal carcinoma in situ left breast. Her granddaughter is with her throughout the encounter      She has no prior history of breast problems. Screening mammogram show a 9 mm area of calcifications in the left breast, central, upper inner quadrant. Image guided biopsy shows ductal carcinoma in situ, intermediate to high grade. Estrogen receptor 100%. Progesterone receptor 0.     Past history significant for appendectomy. TAH/BSO. Hypertension. Left carotid endarterectomy by Dr. Hart Rochester. CKD. Hyperlipidemia Family history negative for breast, ovarian, or colon cancer. Father deceased of heart disease and MI. Mother deceased of heart disease and stroke. Social history reveals that she lives alone in [place]. She is a widow and has 2 children. Very independent. Drives her own car and does all of her own housework. Smoking 8 years ago. Retired.      We had a very long discussion. I explained the difference between in situ and invasive cancer. I discussed the importance of grade of tumor and hormone receptor status. I outlined the options of mastectomy, lumpectomy, radiation therapy, antiestrogen therapy, and discussed the COMET trial where patients are randomized to antiestrogen or standard therapy. She may not be a candidate if her tumor is truly grade 3. She was clearly not interested in any option that left the cancer in her breast and she wants that removed. We talked about this for a long time and it appears that she would not be comfortable with tamoxifen alone, in any event.      Current plan is to proceed with left breast  lumpectomy with radioactive seed localization, and referral to medical oncology and radiation oncology postop.      She knows that she will likely be offered radiation therapy or antiestrogen therapy, possibly both but likely just one. She is aware of the risk of bleeding, infection, chronic pain, cosmetic deformity, reoperation for positive margins or upgrade to invasive disease. She understands all of these issues. All questions are answered. She agrees with this plan.    Past Surgical History  Appendectomy  Breast Biopsy  Left. Carotid Artery Surgery  Left. Cataract Surgery  Bilateral. Foot Surgery  Right. Hysterectomy (not due to cancer) - Complete   Diagnostic Studies History Colonoscopy  1-5 years ago Mammogram  within last year Pap Smear  >5 years ago  Allergies  Sulfa Antibiotics  Allergies Reconciled   Medication History  amLODIPine Besylate (10MG  Tablet, Oral) Active. Rosuvastatin Calcium (10MG  Tablet, Oral) Active. Medications Reconciled  Social History  Caffeine use  Carbonated beverages, Tea. No alcohol use  No drug use  Tobacco use  Former smoker.  Family History  Alcohol Abuse  Father. Arthritis  Father, Mother. Cerebrovascular Accident  Mother. Heart Disease  Father, Mother. Heart disease in female family member before age 38  Respiratory Condition  Father.  Pregnancy / Birth History  Age at menarche  13 years. Age of menopause  63-60 Gravida  3 Maternal age  63-20 Para  2  Other Problems  Cerebrovascular Accident  High blood pressure  Hypercholesterolemia  Oophorectomy     Review of Systems  General Not Present- Appetite  Loss, Chills, Fatigue, Fever, Night Sweats, Weight Gain and Weight Loss. Skin Present- Dryness. Not Present- Change in Wart/Mole, Hives, Jaundice, New Lesions, Non-Healing Wounds, Rash and Ulcer. HEENT Present- Seasonal Allergies and Wears glasses/contact lenses. Not Present- Earache,  Hearing Loss, Hoarseness, Nose Bleed, Oral Ulcers, Ringing in the Ears, Sinus Pain, Sore Throat, Visual Disturbances and Yellow Eyes. Respiratory Not Present- Bloody sputum, Chronic Cough, Difficulty Breathing, Snoring and Wheezing. Breast Not Present- Breast Mass, Breast Pain, Nipple Discharge and Skin Changes. Cardiovascular Not Present- Chest Pain, Difficulty Breathing Lying Down, Leg Cramps, Palpitations, Rapid Heart Rate, Shortness of Breath and Swelling of Extremities. Female Genitourinary Not Present- Frequency, Nocturia, Painful Urination, Pelvic Pain and Urgency. Musculoskeletal Not Present- Back Pain, Joint Pain, Joint Stiffness, Muscle Pain, Muscle Weakness and Swelling of Extremities. Neurological Not Present- Decreased Memory, Fainting, Headaches, Numbness, Seizures, Tingling, Tremor, Trouble walking and Weakness. Psychiatric Not Present- Anxiety, Bipolar, Change in Sleep Pattern, Depression, Fearful and Frequent crying. Endocrine Not Present- Cold Intolerance, Excessive Hunger, Hair Changes, Heat Intolerance, Hot flashes and New Diabetes. Hematology Present- Easy Bruising. Not Present- Blood Thinners, Excessive bleeding, Gland problems, HIV and Persistent Infections.  Vitals  Weight: 109.6 lb Height: 63in Body Surface Area: 1.5 m Body Mass Index: 19.41 kg/m  Temp.: 97.88F  Pulse: 105 (Regular)  BP: 150/70 (Sitting, Left Arm, Standard)    Physical Exam  General Mental Status-Alert. General Appearance-Consistent with stated age. Hydration-Well hydrated. Voice-Normal. Note: Pleasant. Alert. Good insight. BMI 19.   Head and Neck Head-normocephalic, atraumatic with no lesions or palpable masses. Trachea-midline. Thyroid Gland Characteristics - normal size and consistency.  Eye Eyeball - Bilateral-Extraocular movements intact. Sclera/Conjunctiva - Bilateral-No scleral icterus.  Chest and Lung Exam Chest and lung exam reveals -quiet, even  and easy respiratory effort with no use of accessory muscles and on auscultation, normal breath sounds, no adventitious sounds and normal vocal resonance. Inspection Chest Wall - Normal. Back - normal.  Breast Note: Breasts are small to medium size. 30 4B cup. Tiny ecchymoses at lower pole left breast. Biopsy site noted. No hematoma. No palpable mass in either breast. No other skin changes. No axillary adenopathy.   Cardiovascular Cardiovascular examination reveals -normal pedal pulses bilaterally. Note: Regular rate and rhythm with grade 2 systolic murmur left sternal border  Abdomen Inspection Inspection of the abdomen reveals - No Hernias. Skin - Scar - Note: Appendectomy and hysterectomy scars. Palpation/Percussion Palpation and Percussion of the abdomen reveal - Soft, Non Tender, No Rebound tenderness, No Rigidity (guarding) and No hepatosplenomegaly. Auscultation Auscultation of the abdomen reveals - Bowel sounds normal.  Neurologic Neurologic evaluation reveals -alert and oriented x 3 with no impairment of recent or remote memory. Mental Status-Normal.  Musculoskeletal Normal Exam - Left-Upper Extremity Strength Normal and Lower Extremity Strength Normal. Normal Exam - Right-Upper Extremity Strength Normal and Lower Extremity Strength Normal.  Lymphatic Head & Neck  General Head & Neck Lymphatics: Bilateral - Description - Normal. Axillary  General Axillary Region: Bilateral - Description - Normal. Tenderness - Non Tender. Femoral & Inguinal  Generalized Femoral & Inguinal Lymphatics: Bilateral - Description - Normal. Tenderness - Non Tender.    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)   Your recent imaging studies and biopsy show ductal carcinoma in situ of the upper central left breast The size of the tumor is estimated to be 9 mm The tumor is estrogen receptor positive and progesterone receptor negative The grade of the  tumor is intermediate to high-grade  We have discussed options for treatment Mastectomy is an option, but there is no survival advantage to this and I would not recommend that Lumpectomy with radioactive seed localization followed by either antiestrogen therapy or radiation therapy or both is the standard of care  We have also discussed the option of the COMET research protocol in which some women receive only antiestrogen pills and some receive surgery plus other therapies. You may or may not be a candidate for this because of the grade of tumor To find out I would refer you to one of the medical oncologists for discussion  you state that you were not interested in a nonsurgical option and requested I proceed with lumpectomy That is very reasonable  We discussed referral to a medical oncologist and radiation oncologist preop, and you stated that you would wait until postop and that is fine  you will be scheduled for left breast lumpectomy with radioactive seed localization I have discussed the indications, techniques, and risks of the surgery in detail you and your granddaughter   HYPERTENSION, ESSENTIAL, BENIGN (I10) HISTORY OF TOTAL ABDOMINAL HYSTERECTOMY AND BILATERAL SALPINGO-OOPHORECTOMY (Z90.710) HISTORY OF LEFT-SIDED CAROTID ENDARTERECTOMY (Z98.890) HISTORY OF APPENDECTOMY (Z90.49) HYPERLIPIDEMIA, MILD (E78.5)    Amal Renbarger M. Derrell Lolling, M.D., [facility] General and Minimally invasive Surgery Breast and Colorectal Surgery Office:   [phone] Pager:   [phone]

## 2018-05-30 ENCOUNTER — Ambulatory Visit
Admission: RE | Admit: 2018-05-30 | Discharge: 2018-05-30 | Disposition: A | Discharge status: Home / Self Care | Payer: Medicare PPO | Source: Ambulatory Visit | Attending: General Surgery | Admitting: General Surgery

## 2018-05-30 DIAGNOSIS — Z170 Estrogen receptor positive status [ER+]: Secondary | ICD-10-CM

## 2018-05-30 DIAGNOSIS — C50112 Malignant neoplasm of central portion of left female breast: Secondary | ICD-10-CM

## 2018-05-30 DIAGNOSIS — R928 Other abnormal and inconclusive findings on diagnostic imaging of breast: Secondary | ICD-10-CM | POA: Diagnosis not present

## 2018-05-31 ENCOUNTER — Encounter (HOSPITAL_COMMUNITY)
Admission: RE | Disposition: A | Discharge status: Home / Self Care | Payer: Self-pay | Source: Home / Self Care | Attending: General Surgery

## 2018-05-31 ENCOUNTER — Encounter (HOSPITAL_COMMUNITY): Payer: Self-pay

## 2018-05-31 ENCOUNTER — Ambulatory Visit (HOSPITAL_COMMUNITY): Payer: Medicare PPO | Admitting: Certified Registered"

## 2018-05-31 ENCOUNTER — Ambulatory Visit (HOSPITAL_COMMUNITY)
Admission: RE | Admit: 2018-05-31 | Discharge: 2018-05-31 | Disposition: A | Discharge status: Home / Self Care | Payer: Medicare PPO | Attending: General Surgery | Admitting: General Surgery

## 2018-05-31 ENCOUNTER — Ambulatory Visit (HOSPITAL_COMMUNITY): Payer: Medicare PPO | Admitting: Physician Assistant

## 2018-05-31 ENCOUNTER — Ambulatory Visit
Admission: RE | Admit: 2018-05-31 | Discharge: 2018-05-31 | Disposition: A | Discharge status: Home / Self Care | Payer: Medicare PPO | Source: Ambulatory Visit | Attending: General Surgery | Admitting: General Surgery

## 2018-05-31 ENCOUNTER — Other Ambulatory Visit: Payer: Self-pay

## 2018-05-31 DIAGNOSIS — Z836 Family history of other diseases of the respiratory system: Secondary | ICD-10-CM | POA: Insufficient documentation

## 2018-05-31 DIAGNOSIS — E1122 Type 2 diabetes mellitus with diabetic chronic kidney disease: Secondary | ICD-10-CM | POA: Insufficient documentation

## 2018-05-31 DIAGNOSIS — Z8261 Family history of arthritis: Secondary | ICD-10-CM | POA: Diagnosis not present

## 2018-05-31 DIAGNOSIS — I129 Hypertensive chronic kidney disease with stage 1 through stage 4 chronic kidney disease, or unspecified chronic kidney disease: Secondary | ICD-10-CM | POA: Diagnosis not present

## 2018-05-31 DIAGNOSIS — E7800 Pure hypercholesterolemia, unspecified: Secondary | ICD-10-CM | POA: Diagnosis not present

## 2018-05-31 DIAGNOSIS — E785 Hyperlipidemia, unspecified: Secondary | ICD-10-CM | POA: Diagnosis not present

## 2018-05-31 DIAGNOSIS — I739 Peripheral vascular disease, unspecified: Secondary | ICD-10-CM | POA: Diagnosis not present

## 2018-05-31 DIAGNOSIS — Z8249 Family history of ischemic heart disease and other diseases of the circulatory system: Secondary | ICD-10-CM | POA: Insufficient documentation

## 2018-05-31 DIAGNOSIS — C50112 Malignant neoplasm of central portion of left female breast: Secondary | ICD-10-CM

## 2018-05-31 DIAGNOSIS — Z9842 Cataract extraction status, left eye: Secondary | ICD-10-CM | POA: Insufficient documentation

## 2018-05-31 DIAGNOSIS — Z79899 Other long term (current) drug therapy: Secondary | ICD-10-CM | POA: Diagnosis not present

## 2018-05-31 DIAGNOSIS — Z888 Allergy status to other drugs, medicaments and biological substances status: Secondary | ICD-10-CM | POA: Insufficient documentation

## 2018-05-31 DIAGNOSIS — R928 Other abnormal and inconclusive findings on diagnostic imaging of breast: Secondary | ICD-10-CM | POA: Diagnosis not present

## 2018-05-31 DIAGNOSIS — Z823 Family history of stroke: Secondary | ICD-10-CM | POA: Insufficient documentation

## 2018-05-31 DIAGNOSIS — Z882 Allergy status to sulfonamides status: Secondary | ICD-10-CM | POA: Insufficient documentation

## 2018-05-31 DIAGNOSIS — N189 Chronic kidney disease, unspecified: Secondary | ICD-10-CM | POA: Diagnosis not present

## 2018-05-31 DIAGNOSIS — Z811 Family history of alcohol abuse and dependence: Secondary | ICD-10-CM | POA: Insufficient documentation

## 2018-05-31 DIAGNOSIS — Z170 Estrogen receptor positive status [ER+]: Secondary | ICD-10-CM | POA: Diagnosis not present

## 2018-05-31 DIAGNOSIS — Z90710 Acquired absence of both cervix and uterus: Secondary | ICD-10-CM | POA: Insufficient documentation

## 2018-05-31 DIAGNOSIS — Z8673 Personal history of transient ischemic attack (TIA), and cerebral infarction without residual deficits: Secondary | ICD-10-CM | POA: Diagnosis not present

## 2018-05-31 DIAGNOSIS — C50912 Malignant neoplasm of unspecified site of left female breast: Secondary | ICD-10-CM | POA: Diagnosis not present

## 2018-05-31 DIAGNOSIS — D0512 Intraductal carcinoma in situ of left breast: Secondary | ICD-10-CM

## 2018-05-31 DIAGNOSIS — Z9841 Cataract extraction status, right eye: Secondary | ICD-10-CM | POA: Insufficient documentation

## 2018-05-31 DIAGNOSIS — Z87891 Personal history of nicotine dependence: Secondary | ICD-10-CM | POA: Diagnosis not present

## 2018-05-31 DIAGNOSIS — M109 Gout, unspecified: Secondary | ICD-10-CM | POA: Diagnosis not present

## 2018-05-31 DIAGNOSIS — N184 Chronic kidney disease, stage 4 (severe): Secondary | ICD-10-CM | POA: Diagnosis not present

## 2018-05-31 HISTORY — PX: BREAST LUMPECTOMY WITH RADIOACTIVE SEED LOCALIZATION: SHX6424

## 2018-05-31 HISTORY — PX: BREAST LUMPECTOMY: SHX2

## 2018-05-31 HISTORY — DX: Intraductal carcinoma in situ of left breast: D05.12

## 2018-05-31 SURGERY — BREAST LUMPECTOMY WITH RADIOACTIVE SEED LOCALIZATION
Anesthesia: General | Site: Breast | Laterality: Left

## 2018-05-31 MED ORDER — ONDANSETRON HCL 4 MG/2ML IJ SOLN
INTRAMUSCULAR | Status: DC | PRN
  Administered 2018-05-31: 4 mg via INTRAVENOUS

## 2018-05-31 MED ORDER — MIDAZOLAM HCL 2 MG/2ML IJ SOLN: 0.5000 mg | Freq: Once | INTRAMUSCULAR | Status: DC | PRN

## 2018-05-31 MED ORDER — PROMETHAZINE HCL 25 MG/ML IJ SOLN: 6.2500 mg | INTRAMUSCULAR | Status: DC | PRN

## 2018-05-31 MED ORDER — ACETAMINOPHEN 500 MG PO TABS
1000.0000 mg | ORAL_TABLET | ORAL | Status: DC
  Filled 2018-05-31: qty 2

## 2018-05-31 MED ORDER — CEFAZOLIN SODIUM-DEXTROSE 2-4 GM/100ML-% IV SOLN
2.0000 g | INTRAVENOUS | Status: AC
  Administered 2018-05-31: 2 g via INTRAVENOUS
  Filled 2018-05-31: qty 100

## 2018-05-31 MED ORDER — DEXAMETHASONE SODIUM PHOSPHATE 10 MG/ML IJ SOLN
INTRAMUSCULAR | Status: AC
  Filled 2018-05-31: qty 1

## 2018-05-31 MED ORDER — ACETAMINOPHEN 160 MG/5ML PO SOLN
ORAL | Status: AC
  Administered 2018-05-31: 1000 mg via ORAL
  Filled 2018-05-31: qty 40.6

## 2018-05-31 MED ORDER — BUPIVACAINE-EPINEPHRINE 0.25% -1:200000 IJ SOLN
INTRAMUSCULAR | Status: DC | PRN
  Administered 2018-05-31: 10 mL

## 2018-05-31 MED ORDER — FENTANYL CITRATE (PF) 100 MCG/2ML IJ SOLN
INTRAMUSCULAR | Status: DC | PRN
  Administered 2018-05-31 (×2): 25 ug via INTRAVENOUS

## 2018-05-31 MED ORDER — CHLORHEXIDINE GLUCONATE CLOTH 2 % EX PADS: 6.0000 | MEDICATED_PAD | Freq: Once | CUTANEOUS | Status: DC

## 2018-05-31 MED ORDER — BUPIVACAINE HCL (PF) 0.25 % IJ SOLN
INTRAMUSCULAR | Status: AC
  Filled 2018-05-31: qty 30

## 2018-05-31 MED ORDER — ACETAMINOPHEN 160 MG/5ML PO SOLN
1000.0000 mg | ORAL | Status: AC
  Administered 2018-05-31: 1000 mg via ORAL

## 2018-05-31 MED ORDER — PROPOFOL 10 MG/ML IV BOLUS
INTRAVENOUS | Status: DC | PRN
  Administered 2018-05-31: 30 mg via INTRAVENOUS
  Administered 2018-05-31: 80 mg via INTRAVENOUS
  Administered 2018-05-31: 30 mg via INTRAVENOUS
  Administered 2018-05-31: 20 mg via INTRAVENOUS

## 2018-05-31 MED ORDER — HYDROCODONE-ACETAMINOPHEN 5-325 MG PO TABS: 1.0000 | ORAL_TABLET | Freq: Four times a day (QID) | ORAL | 0 refills | Status: DC | PRN

## 2018-05-31 MED ORDER — SUCCINYLCHOLINE CHLORIDE 20 MG/ML IJ SOLN
INTRAMUSCULAR | Status: DC | PRN
  Administered 2018-05-31: 80 mg via INTRAVENOUS

## 2018-05-31 MED ORDER — EPHEDRINE 5 MG/ML INJ
INTRAVENOUS | Status: AC
  Filled 2018-05-31: qty 10

## 2018-05-31 MED ORDER — SODIUM CHLORIDE 0.9 % IV SOLN
INTRAVENOUS | Status: DC
  Administered 2018-05-31: 08:00:00 via INTRAVENOUS

## 2018-05-31 MED ORDER — MEPERIDINE HCL 50 MG/ML IJ SOLN: 6.2500 mg | INTRAMUSCULAR | Status: DC | PRN

## 2018-05-31 MED ORDER — PHENYLEPHRINE 40 MCG/ML (10ML) SYRINGE FOR IV PUSH (FOR BLOOD PRESSURE SUPPORT)
PREFILLED_SYRINGE | INTRAVENOUS | Status: AC
  Filled 2018-05-31: qty 20

## 2018-05-31 MED ORDER — 0.9 % SODIUM CHLORIDE (POUR BTL) OPTIME
TOPICAL | Status: DC | PRN
  Administered 2018-05-31: 1000 mL

## 2018-05-31 MED ORDER — DEXAMETHASONE SODIUM PHOSPHATE 10 MG/ML IJ SOLN
INTRAMUSCULAR | Status: DC | PRN
  Administered 2018-05-31: 5 mg via INTRAVENOUS

## 2018-05-31 MED ORDER — GABAPENTIN 300 MG PO CAPS
300.0000 mg | ORAL_CAPSULE | ORAL | Status: AC
  Administered 2018-05-31: 300 mg via ORAL
  Filled 2018-05-31: qty 1

## 2018-05-31 MED ORDER — FENTANYL CITRATE (PF) 250 MCG/5ML IJ SOLN
INTRAMUSCULAR | Status: AC
  Filled 2018-05-31: qty 5

## 2018-05-31 MED ORDER — LIDOCAINE 2% (20 MG/ML) 5 ML SYRINGE
INTRAMUSCULAR | Status: DC | PRN
  Administered 2018-05-31: 10 mg via INTRAVENOUS

## 2018-05-31 MED ORDER — LIDOCAINE 2% (20 MG/ML) 5 ML SYRINGE
INTRAMUSCULAR | Status: AC
  Filled 2018-05-31: qty 5

## 2018-05-31 MED ORDER — FENTANYL CITRATE (PF) 100 MCG/2ML IJ SOLN: 25.0000 ug | INTRAMUSCULAR | Status: DC | PRN

## 2018-05-31 MED ORDER — EPHEDRINE SULFATE-NACL 50-0.9 MG/10ML-% IV SOSY
PREFILLED_SYRINGE | INTRAVENOUS | Status: DC | PRN
  Administered 2018-05-31: 5 mg via INTRAVENOUS

## 2018-05-31 MED ORDER — PHENYLEPHRINE 40 MCG/ML (10ML) SYRINGE FOR IV PUSH (FOR BLOOD PRESSURE SUPPORT)
PREFILLED_SYRINGE | INTRAVENOUS | Status: DC | PRN
  Administered 2018-05-31: 80 ug via INTRAVENOUS
  Administered 2018-05-31: 120 ug via INTRAVENOUS
  Administered 2018-05-31: 40 ug via INTRAVENOUS
  Administered 2018-05-31 (×2): 80 ug via INTRAVENOUS

## 2018-05-31 SURGICAL SUPPLY — 42 items
ADH SKN CLS APL DERMABOND .7 (GAUZE/BANDAGES/DRESSINGS) ×1
APPLIER CLIP 9.375 MED OPEN (MISCELLANEOUS) ×2
APR CLP MED 9.3 20 MLT OPN (MISCELLANEOUS) ×1
BINDER BREAST LRG (GAUZE/BANDAGES/DRESSINGS) IMPLANT
BINDER BREAST XLRG (GAUZE/BANDAGES/DRESSINGS) IMPLANT
CANISTER SUCT 3000ML PPV (MISCELLANEOUS) ×2 IMPLANT
CHLORAPREP W/TINT 26ML (MISCELLANEOUS) ×2 IMPLANT
CLIP APPLIE 9.375 MED OPEN (MISCELLANEOUS) ×1 IMPLANT
COVER PROBE W GEL 5X96 (DRAPES) ×2 IMPLANT
COVER SURGICAL LIGHT HANDLE (MISCELLANEOUS) ×2 IMPLANT
COVER WAND RF STERILE (DRAPES) ×2 IMPLANT
DERMABOND ADVANCED (GAUZE/BANDAGES/DRESSINGS) ×1
DERMABOND ADVANCED .7 DNX12 (GAUZE/BANDAGES/DRESSINGS) ×1 IMPLANT
DEVICE DUBIN SPECIMEN MAMMOGRA (MISCELLANEOUS) ×2 IMPLANT
DRAPE CHEST BREAST 15X10 FENES (DRAPES) ×2 IMPLANT
DRSG PAD ABDOMINAL 8X10 ST (GAUZE/BANDAGES/DRESSINGS) ×2 IMPLANT
ELECT CAUTERY BLADE 6.4 (BLADE) ×2 IMPLANT
ELECT REM PT RETURN 9FT ADLT (ELECTROSURGICAL) ×2
ELECTRODE REM PT RTRN 9FT ADLT (ELECTROSURGICAL) ×1 IMPLANT
GAUZE SPONGE 4X4 12PLY STRL (GAUZE/BANDAGES/DRESSINGS) ×2 IMPLANT
GAUZE SPONGE 4X4 12PLY STRL LF (GAUZE/BANDAGES/DRESSINGS) ×2 IMPLANT
GLOVE EUDERMIC 7 POWDERFREE (GLOVE) ×4 IMPLANT
GOWN STRL REUS W/ TWL LRG LVL3 (GOWN DISPOSABLE) ×1 IMPLANT
GOWN STRL REUS W/ TWL XL LVL3 (GOWN DISPOSABLE) ×1 IMPLANT
GOWN STRL REUS W/TWL LRG LVL3 (GOWN DISPOSABLE) ×2
GOWN STRL REUS W/TWL XL LVL3 (GOWN DISPOSABLE) ×2
ILLUMINATOR WAVEGUIDE N/F (MISCELLANEOUS) IMPLANT
KIT BASIN OR (CUSTOM PROCEDURE TRAY) ×2 IMPLANT
KIT MARKER MARGIN INK (KITS) ×2 IMPLANT
LIGHT WAVEGUIDE WIDE FLAT (MISCELLANEOUS) IMPLANT
NDL HYPO 25GX1X1/2 BEV (NEEDLE) ×1 IMPLANT
NEEDLE HYPO 25GX1X1/2 BEV (NEEDLE) ×2 IMPLANT
NS IRRIG 1000ML POUR BTL (IV SOLUTION) ×2 IMPLANT
PACK GENERAL/GYN (CUSTOM PROCEDURE TRAY) ×2 IMPLANT
PAD ABD 8X10 STRL (GAUZE/BANDAGES/DRESSINGS) ×2 IMPLANT
SPONGE LAP 4X18 RFD (DISPOSABLE) ×2 IMPLANT
SUT MNCRL AB 4-0 PS2 18 (SUTURE) ×2 IMPLANT
SUT SILK 2 0 SH (SUTURE) ×2 IMPLANT
SUT VIC AB 3-0 SH 18 (SUTURE) ×2 IMPLANT
SYR CONTROL 10ML LL (SYRINGE) ×2 IMPLANT
TOWEL OR 17X24 6PK STRL BLUE (TOWEL DISPOSABLE) ×2 IMPLANT
TOWEL OR 17X26 10 PK STRL BLUE (TOWEL DISPOSABLE) ×2 IMPLANT

## 2018-05-31 NOTE — Progress Notes (Signed)
Pt unable to take PO medications.  Pharmacy adjusted PO Tylenol to liquid Tylenol.   Pt given liquid Tylenol 1,000mg .

## 2018-05-31 NOTE — Transfer of Care (Signed)
Immediate Anesthesia Transfer of Care Note  Patient: Diane Carlson  Procedure(s) Performed: LEFT BREAST LUMPECTOMY WITH RADIOACTIVE SEED LOCALIZATION (Left Breast)  Patient Location: PACU  Anesthesia Type:General  Level of Consciousness: drowsy  Airway & Oxygen Therapy: Patient Spontanous Breathing and Patient connected to nasal cannula oxygen  Post-op Assessment: Report given to RN and Post -op Vital signs reviewed and stable  Post vital signs: Reviewed and stable  Last Vitals:  Vitals Value Taken Time  BP 99/54 05/31/2018 10:59 AM  Temp 36.4 C 05/31/2018 10:59 AM  Pulse 71 05/31/2018 11:00 AM  Resp 24 05/31/2018 11:00 AM  SpO2 100 % 05/31/2018 11:00 AM  Vitals shown include unvalidated device data.  Last Pain:  Vitals:   05/31/18 1059  TempSrc:   PainSc: (P) Asleep         Complications: No apparent anesthesia complications

## 2018-05-31 NOTE — Anesthesia Procedure Notes (Signed)
Procedure Name: Intubation Date/Time: 05/31/2018 9:49 AM Performed by: Gwyndolyn Saxon, CRNA Pre-anesthesia Checklist: Patient identified, Emergency Drugs available, Suction available and Patient being monitored Patient Re-evaluated:Patient Re-evaluated prior to induction Oxygen Delivery Method: Circle system utilized Preoxygenation: Pre-oxygenation with 100% oxygen Induction Type: IV induction, Rapid sequence and Cricoid Pressure applied Laryngoscope Size: Miller and 2 Grade View: Grade I Tube type: Oral Tube size: 6.5 mm Number of attempts: 1 Airway Equipment and Method: Stylet Placement Confirmation: ETT inserted through vocal cords under direct vision,  positive ETCO2 and breath sounds checked- equal and bilateral Secured at: 20 cm Tube secured with: Tape Dental Injury: Teeth and Oropharynx as per pre-operative assessment

## 2018-05-31 NOTE — Discharge Instructions (Signed)
[facility] °Office Phone Number [phone] ° °BREAST BIOPSY/ PARTIAL MASTECTOMY: POST OP INSTRUCTIONS ° °Always review your discharge instruction sheet given to you by the facility where your surgery was performed. ° °IF YOU HAVE DISABILITY OR FAMILY LEAVE FORMS, YOU MUST BRING THEM TO THE OFFICE FOR PROCESSING.  DO NOT GIVE THEM TO YOUR DOCTOR. ° °1. A prescription for pain medication may be given to you upon discharge.  Take your pain medication as prescribed, if needed.  If narcotic pain medicine is not needed, then you may take acetaminophen (Tylenol) or ibuprofen (Advil) as needed. °2. Take your usually prescribed medications unless otherwise directed °3. If you need a refill on your pain medication, please contact your pharmacy.  They will contact our office to request authorization.  Prescriptions will not be filled after 5pm or on week-ends. °4. You should eat very light the first 24 hours after surgery, such as soup, crackers, pudding, etc.  Resume your normal diet the day after surgery. °5. Most patients will experience some swelling and bruising in the breast.  Ice packs and a good support bra will help.  Swelling and bruising can take several days to resolve.  °6. It is common to experience some constipation if taking pain medication after surgery.  Increasing fluid intake and taking a stool softener will usually help or prevent this problem from occurring.  A mild laxative (Milk of Magnesia or Miralax) should be taken according to package directions if there are no bowel movements after 48 hours. °7. Unless discharge instructions indicate otherwise, you may remove your bandages 24-48 hours after surgery, and you may shower at that time.  You may have steri-strips (small skin tapes) in place directly over the incision.  These strips should be left on the skin for 7-10 days.  If your surgeon used skin glue on the incision, you may shower in 24 hours.  The glue will flake off over the  next 2-3 weeks.  Any sutures or staples will be removed at the office during your follow-up visit. °8. ACTIVITIES:  You may resume regular daily activities (gradually increasing) beginning the next day.  Wearing a good support bra or sports bra minimizes pain and swelling.  You may have sexual intercourse when it is comfortable. °a. You may drive when you no longer are taking prescription pain medication, you can comfortably wear a seatbelt, and you can safely maneuver your car and apply brakes. °b. RETURN TO WORK:  ______________________________________________________________________________________ °9. You should see your doctor in the office for a follow-up appointment approximately two weeks after your surgery.  Your doctor’s nurse will typically make your follow-up appointment when she calls you with your pathology report.  Expect your pathology report 2-3 business days after your surgery.  You may call to check if you do not hear from us after three days. °10. OTHER INSTRUCTIONS: _______________________________________________________________________________________________ _____________________________________________________________________________________________________________________________________ °_____________________________________________________________________________________________________________________________________ °_____________________________________________________________________________________________________________________________________ ° °WHEN TO CALL YOUR DOCTOR: °1. Fever over 101.0 °2. Nausea and/or vomiting. °3. Extreme swelling or bruising. °4. Continued bleeding from incision. °5. Increased pain, redness, or drainage from the incision. ° °The clinic staff is available to answer your questions during regular business hours.  Please don’t hesitate to call and ask to speak to one of the nurses for clinical concerns.  If you have a medical emergency, go to the nearest  emergency room or call 911.  A surgeon from [facility] is always on call at the hospital. ° °For further questions, please visit [email or web address]  ° ° ° °  Post Anesthesia Home Care Instructions  Activity: Get plenty of rest for the remainder of the day. A responsible individual must stay with you for 24 hours following the procedure.  For the next 24 hours, DO NOT: -Drive a car -Paediatric nurse -Drink alcoholic beverages -Take any medication unless instructed by your physician -Make any legal decisions or sign important papers.  Meals: Start with liquid foods such as gelatin or soup. Progress to regular foods as tolerated. Avoid greasy, spicy, heavy foods. If nausea and/or vomiting occur, drink only clear liquids until the nausea and/or vomiting subsides. Call your physician if vomiting continues.  Special Instructions/Symptoms: Your throat may feel dry or sore from the anesthesia or the breathing tube placed in your throat during surgery. If this causes discomfort, gargle with warm salt water. The discomfort should disappear within 24 hours.  If you had a scopolamine patch placed behind your ear for the management of post- operative nausea and/or vomiting:  1. The medication in the patch is effective for 72 hours, after which it should be removed.  Wrap patch in a tissue and discard in the trash. Wash hands thoroughly with soap and water. 2. You may remove the patch earlier than 72 hours if you experience unpleasant side effects which may include dry mouth, dizziness or visual disturbances. 3. Avoid touching the patch. Wash your hands with soap and water after contact with the patch.                 Managing Your Pain After Surgery Without Opioids    Thank you for participating in our program to help patients manage their pain after surgery without opioids. This is part of our effort to provide you with the best care possible, without  exposing you or your family to the risk that opioids pose.  What pain can I expect after surgery? You can expect to have some pain after surgery. This is normal. The pain is typically worse the day after surgery, and quickly begins to get better. Many studies have found that many patients are able to manage their pain after surgery with Over-the-Counter (OTC) medications such as Tylenol and Motrin. If you have a condition that does not allow you to take Tylenol or Motrin, notify your surgical team.  How will I manage my pain? The best strategy for controlling your pain after surgery is around the clock pain control with Tylenol (acetaminophen) and Motrin (ibuprofen or Advil). Alternating these medications with each other allows you to maximize your pain control. In addition to Tylenol and Motrin, you can use heating pads or ice packs on your incisions to help reduce your pain.  How will I alternate your regular strength over-the-counter pain medication? You will take a dose of pain medication every three hours. ; Start by taking 650 mg of Tylenol (2 pills of 325 mg) ; 3 hours later take 600 mg of Motrin (3 pills of 200 mg) ; 3 hours after taking the Motrin take 650 mg of Tylenol ; 3 hours after that take 600 mg of Motrin.   - 1 -  See example - if your first dose of Tylenol is at 12:00 PM   12:00 PM Tylenol 650 mg (2 pills of 325 mg)  3:00 PM Motrin 600 mg (3 pills of 200 mg)  6:00 PM Tylenol 650 mg (2 pills of 325 mg)  9:00 PM Motrin 600 mg (3 pills of 200 mg)  Continue alternating every 3 hours   We recommend  that you follow this schedule around-the-clock for at least 3 days after surgery, or until you feel that it is no longer needed. Use the table on the last page of this handout to keep track of the medications you are taking. Important: Do not take more than 3000mg  of Tylenol or 3200mg  of Motrin in a 24-hour period. Do not take ibuprofen/Motrin if you have a history of bleeding  stomach ulcers, severe kidney disease, &/or actively taking a blood thinner  What if I still have pain? If you have pain that is not controlled with the over-the-counter pain medications (Tylenol and Motrin or Advil) you might have what we call breakthrough pain. You will receive a prescription for a small amount of an opioid pain medication such as Oxycodone, Tramadol, or Tylenol with Codeine. Use these opioid pills in the first 24 hours after surgery if you have breakthrough pain. Do not take more than 1 pill every 4-6 hours.  If you still have uncontrolled pain after using all opioid pills, don't hesitate to call our staff using the number provided. We will help make sure you are managing your pain in the best way possible, and if necessary, we can provide a prescription for additional pain medication.   Day 1    Time  Name of Medication Number of pills taken  Amount of Acetaminophen  Pain Level   Comments  AM PM       AM PM       AM PM       AM PM       AM PM       AM PM       AM PM       AM PM       Total Daily amount of Acetaminophen Do not take more than  3,000 mg per day      Day 2    Time  Name of Medication Number of pills taken  Amount of Acetaminophen  Pain Level   Comments  AM PM       AM PM       AM PM       AM PM       AM PM       AM PM       AM PM       AM PM       Total Daily amount of Acetaminophen Do not take more than  3,000 mg per day      Day 3    Time  Name of Medication Number of pills taken  Amount of Acetaminophen  Pain Level   Comments  AM PM       AM PM       AM PM       AM PM          AM PM       AM PM       AM PM       AM PM       Total Daily amount of Acetaminophen Do not take more than  3,000 mg per day      Day 4    Time  Name of Medication Number of pills taken  Amount of Acetaminophen  Pain Level   Comments  AM PM       AM PM       AM PM       AM PM  AM PM       AM PM       AM PM       AM  PM       Total Daily amount of Acetaminophen Do not take more than  3,000 mg per day      Day 5    Time  Name of Medication Number of pills taken  Amount of Acetaminophen  Pain Level   Comments  AM PM       AM PM       AM PM       AM PM       AM PM       AM PM       AM PM       AM PM       Total Daily amount of Acetaminophen Do not take more than  3,000 mg per day       Day 6    Time  Name of Medication Number of pills taken  Amount of Acetaminophen  Pain Level  Comments  AM PM       AM PM       AM PM       AM PM       AM PM       AM PM       AM PM       AM PM       Total Daily amount of Acetaminophen Do not take more than  3,000 mg per day      Day 7    Time  Name of Medication Number of pills taken  Amount of Acetaminophen  Pain Level   Comments  AM PM       AM PM       AM PM       AM PM       AM PM       AM PM       AM PM       AM PM       Total Daily amount of Acetaminophen Do not take more than  3,000 mg per day        For additional information about how and where to safely dispose of unused opioid medications - [employer]  Disclaimer: This document contains information and/or instructional materials adapted from [place] for the typical patient with your condition. It does not replace medical advice from your health care provider because your experience may differ from that of the typical patient. Talk to your health care provider if you have any questions about this document, your condition or your treatment plan. Adapted from [place]

## 2018-05-31 NOTE — Op Note (Signed)
Patient Name:           Diane Carlson   Date of Surgery:        05/31/2018  Pre op Diagnosis:      Ductal carcinoma in situ left breast, estrogen receptor positive  Post op Diagnosis:    Same  Procedure:                 Left breast lumpectomy with radioactive seed localization                                      Reexcision posterior margin                                      Reexcision medial margin  Surgeon:                     Edsel Petrin. Dalbert Batman, M.D., FACS  Assistant:                      OR staff  Operative Indications:   This is a 73 year old woman from [place]. She is referred by Franki Cabot at the [facility] and her PCP, Adam Phenix, for surgical management of ductal carcinoma in situ left breast.        Screening mammogram show a 9 mm area of calcifications in the left breast, central, upper inner quadrant. Image guided biopsy shows ductal carcinoma in situ, intermediate to high grade. Estrogen receptor 100%. Progesterone receptor 0. Family history negative for breast, ovarian, or colon cancer.      We had a very long discussion. I explained the difference between in situ and invasive cancer. I discussed the importance of grade of tumor and hormone receptor status. I outlined the options of mastectomy, lumpectomy, radiation therapy, antiestrogen therapy, and discussed the COMET trial.  She was not interested in active observation.      Current plan is to proceed with left breast lumpectomy with radioactive seed localization, and referral to medical oncology and radiation oncology postop.      She knows that she will likely be offered radiation therapy or antiestrogen therapy, possibly both but likely just one. She agrees with this plan.  Operative Findings:       The marker clip and radioactive seed were very close to one another.  They were deep in the central breast.  I was able to make a superiorly placed circumareolar incision, hidden scar technique, at  the areolar margin.  I took the dissection all the way down to the pectoralis muscle and so the broad posterior margin is the pectoralis muscle.  I reexcised the posterior margin and I reexcised the medial margin.  Procedure in Detail:          Following the induction of general endotracheal anesthesia the patient's left breast was prepped and draped in a sterile fashion.  Intravenous antibiotics were given.  Surgical timeout was performed.  0.25% Marcaine with epinephrine was used as local infiltration anesthetic.      Using the neoprobe I isolated the radioactive signal at the 12 o'clock position just inside the superior areolar margin.  Circumareolar incision was made superiorly.  Lumpectomy was performed with electrocautery and the neoprobe.  The specimen was removed and marked with silk sutures and a  6 color ink kit to orient the pathologist.  Specimen mammogram looked good containing the seed and the marker clip.  During the lumpectomy I thought I might be close medially and posteriorly so I reexcised medial margin and the posterior margin and sent them as separate specimens.  The wound was irrigated with saline.  Hemostasis was excellent.  I placed 5 metal marker clips in the walls of the lumpectomy cavity.  The lumpectomy cavity was closed in 2 separate layers with interrupted 3-0 Vicryl and the skin closed with a running subcuticular 4-0 Monocryl and Dermabond.  Breast binder was placed and the patient taken to PACU in stable condition.  EBL 10 to 20 cc.  Counts correct.  Complications none.    Addendum: I logged onto the PMP aware website and reviewed her prescription medication history     Jacynda Brunke M. Dalbert Batman, M.D., FACS General and Minimally Invasive Surgery Breast and Colorectal Surgery  05/31/2018 10:52 AM

## 2018-05-31 NOTE — Anesthesia Postprocedure Evaluation (Signed)
Anesthesia Post Note  Patient: Diane Carlson  Procedure(s) Performed: LEFT BREAST LUMPECTOMY WITH RADIOACTIVE SEED LOCALIZATION (Left Breast)     Patient location during evaluation: PACU Anesthesia Type: General Level of consciousness: awake and alert, patient cooperative and oriented Pain management: pain level controlled Vital Signs Assessment: post-procedure vital signs reviewed and stable Respiratory status: spontaneous breathing, nonlabored ventilation and respiratory function stable Cardiovascular status: blood pressure returned to baseline and stable Postop Assessment: no apparent nausea or vomiting Anesthetic complications: no    Last Vitals:  Vitals:   05/31/18 1128 05/31/18 1142  BP: 123/65 129/74  Pulse: 84 91  Resp: 19 18  Temp: 36.4 C   SpO2: 99% 96%    Last Pain:  Vitals:   05/31/18 1142  TempSrc:   PainSc: 0-No pain                 Yasemin Rabon,E. Cassandra Mcmanaman

## 2018-05-31 NOTE — Interval H&P Note (Signed)
History and Physical Interval Note:  05/31/2018 7:36 AM  Diane Carlson  has presented today for surgery, with the diagnosis of left breast cancer dcis  The various methods of treatment have been discussed with the patient and family. After consideration of risks, benefits and other options for treatment, the patient has consented to  Procedure(s): LEFT BREAST LUMPECTOMY WITH RADIOACTIVE SEED LOCALIZATION (Left) as a surgical intervention .  The patient's history has been reviewed, patient examined, no change in status, stable for surgery.  I have reviewed the patient's chart and labs.  Questions were answered to the patient's satisfaction.     Adin Hector

## 2018-06-01 ENCOUNTER — Encounter (HOSPITAL_COMMUNITY): Payer: Self-pay | Admitting: General Surgery

## 2018-06-03 ENCOUNTER — Other Ambulatory Visit: Payer: Self-pay | Admitting: Family Medicine

## 2018-06-07 ENCOUNTER — Telehealth: Payer: Self-pay

## 2018-06-07 DIAGNOSIS — H31001 Unspecified chorioretinal scars, right eye: Secondary | ICD-10-CM | POA: Diagnosis not present

## 2018-06-07 DIAGNOSIS — Z961 Presence of intraocular lens: Secondary | ICD-10-CM | POA: Diagnosis not present

## 2018-06-07 DIAGNOSIS — H35411 Lattice degeneration of retina, right eye: Secondary | ICD-10-CM | POA: Diagnosis not present

## 2018-06-07 DIAGNOSIS — H04123 Dry eye syndrome of bilateral lacrimal glands: Secondary | ICD-10-CM | POA: Diagnosis not present

## 2018-06-07 NOTE — Telephone Encounter (Signed)
Pt was wanting Xyzal sent to [facility] mail order Pt aware this was sent in yesterday

## 2018-06-07 NOTE — Telephone Encounter (Signed)
Need my meds faxed into mail order [phone]

## 2018-06-13 ENCOUNTER — Telehealth: Payer: Self-pay | Admitting: Hematology and Oncology

## 2018-06-13 NOTE — Telephone Encounter (Signed)
A new patient breast appt has been scheduled for the pt to see Dr. Lindi Adie on 3/24 at 345pm. Pt has agreed to the appt date and time.

## 2018-06-14 NOTE — Progress Notes (Addendum)
Radiation Oncology         [phone] ________________________________  Initial telemedicine consultation  (Due to the Covid 19 virus, the patient requested a telephone consultation with me.)  Name: Diane Carlson MRN: [id number]  Date: 06/15/2018  DOB: [birth year]  QI:[id number], Koleen Distance, DO  Fanny Skates, MD   REFERRING PHYSICIAN: Fanny Skates, MD  DIAGNOSIS:    ICD-10-CM   1. Carcinoma of upper-inner quadrant of left breast in female, estrogen receptor positive ([place]) C50.212    Z17.0   Cancer Staging Carcinoma of upper-inner quadrant of left breast in female, estrogen receptor positive ([place]) Staging form: Breast, AJCC 8th Edition - Pathologic: Stage IA (pT1a, pN0, cM0, G1, ER+, PR-, HER2-) - Signed by Eppie Gibson, MD on 06/17/2018    CHIEF COMPLAINT: Here to discuss management of left breast cancer  HISTORY OF PRESENT ILLNESS:Diane Carlson is a 73 y.o. female who presented with breast abnormality on the following imaging: mammogram.  Symptoms, if any, at that time, were: none.   Diagnostic mammogram of left breast  revealed a group of calcifications measuring approximately 9 mm.   Biopsy on date of 05/12/2018 showed ductal carcinoma in situ with calcifications.  ER status: 100%; PR status 0%; Grade intermediate to high.  She opted to proceed with left breast lumpectomy on date of 05/31/2018 under Dr. Dalbert Batman with pathology report revealing: tumor size of 0.4 cm; histology of mucinous adenocarcinoma; margin status to invasive disease of greater than 10 mm and margin status to in situ disease of greater than 10 mm; ER status: 95%; PR status 0%, Her2 status negative; Grade 1.   She is scheduled to meet with Dr. Lindi Adie on 06/21/2018.   I reviewed her imaging, pathology, and EMR history/pertinent notes personally.    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  We discussed the patient's diagnosis of   ER positive breast cancer and general treatment for this  We had a thorough discussion about her options for adjuvant therapy. One option would be antiestrogen therapy as discussed with medical oncology. She would take a pill for approximately 5 years. The alternative option would be radiotherapy to the breast.   I recommend that she proceed with consultation with medical oncology.  I think that her prognosis will be excellent with breast conserving surgery followed by antiestrogen pill alone.  If for some reason she does not want to take the pill after this consultation then we can schedule her for simulation for radiotherapy.  Anticipate that radiotherapy would take 3.5 weeks.  However I would not recommend that she get any radiotherapy right away until the risks of Covid 19 infection settle.  For now I recommend social isolation as much as possible   The patient has my contact information.  She will call if she opts out of taking the antiestrogen pill.  However I think that the pill is in her best interest and I would recommend that over radiotherapy.   The patient was encouraged to ask questions that I answered for her. I will see her PRN. She is pleased with this plan.  In a telemedicine visit lasting 10 minutes, greater than 50% of the time was spent discussing logistics of treatment, and coordinating the patient's care.   __________________________________________   Eppie Gibson, MD   This document serves as a record of services personally performed by Eppie Gibson, MD.  It was created on her behalf by Wilburn Mylar, a trained medical scribe. The creation of this record is based on the scribe's personal observations and the provider's statements to them. This document has been checked and approved by the attending provider.

## 2018-06-15 ENCOUNTER — Ambulatory Visit
Admission: RE | Admit: 2018-06-15 | Discharge: 2018-06-15 | Disposition: A | Discharge status: Home / Self Care | Payer: Medicare PPO | Source: Ambulatory Visit | Attending: Radiation Oncology | Admitting: Radiation Oncology

## 2018-06-15 ENCOUNTER — Ambulatory Visit: Payer: Medicare PPO

## 2018-06-15 DIAGNOSIS — Z170 Estrogen receptor positive status [ER+]: Secondary | ICD-10-CM

## 2018-06-15 DIAGNOSIS — C50212 Malignant neoplasm of upper-inner quadrant of left female breast: Secondary | ICD-10-CM

## 2018-06-17 ENCOUNTER — Encounter: Payer: Self-pay | Admitting: Radiation Oncology

## 2018-06-17 ENCOUNTER — Telehealth: Payer: Self-pay

## 2018-06-17 DIAGNOSIS — C50212 Malignant neoplasm of upper-inner quadrant of left female breast: Secondary | ICD-10-CM | POA: Insufficient documentation

## 2018-06-17 DIAGNOSIS — Z170 Estrogen receptor positive status [ER+]: Secondary | ICD-10-CM | POA: Insufficient documentation

## 2018-06-17 NOTE — Telephone Encounter (Signed)
Pt calling to see if Dr.Gudena would do a phone cosultation, instead of a face to face appt next Tuesday. Pt is very anxious about covid exposure and is currently getting over pneumonia at this time, as well as recovering from surgery. Will notify Dr.Gudena next week and contact pt on Monday of MD decision. Pt does not want to miss appt but would like to do a phone visit instead.

## 2018-06-20 ENCOUNTER — Telehealth: Payer: Self-pay | Admitting: Hematology and Oncology

## 2018-06-20 NOTE — Telephone Encounter (Signed)
Per 3/23 schedule message spoke with patient re cancelling physical visit in office 3/24 and changing appointment to a virtual visit for 3/24 @ 9:45 am. Patient confirmed and is expecting to be contact by VG 3/24 @ 9:45 am.

## 2018-06-20 NOTE — Telephone Encounter (Signed)
Due to new patient status 3/24 virtual office visit cancelled. Patient will see VG for new patient visit 3/27 @ 12:30 pm. Date/time/patient aware per desk nurse.

## 2018-06-21 ENCOUNTER — Ambulatory Visit: Payer: Medicare PPO | Admitting: Hematology and Oncology

## 2018-06-21 ENCOUNTER — Telehealth: Payer: Medicare PPO | Admitting: Hematology and Oncology

## 2018-06-21 NOTE — Progress Notes (Signed)
[place] NOTE  Patient Care Team: [name], DO as PCP - General (Family Medicine) Rexene Agent, MD as Attending Physician (Nephrology)    HEMATOLOGY-ONCOLOGY [facility] VISIT PROGRESS NOTE  I connected with Diane Carlson on 06/24/2018 at 12:00 PM EDT by Webex video conference and verified that I am speaking with the correct person using two identifiers.  I discussed the limitations, risks, security and privacy concerns of performing an evaluation and management service by Webex and the availability of in person appointments.  I also discussed with the patient that there may be a patient responsible charge related to this service. The patient expressed understanding and agreed to proceed.   CHIEF COMPLAINTS/PURPOSE OF CONSULTATION: Newly diagnosed breast cancer  HISTORY OF PRESENTING ILLNESS:  Diane Carlson 73 y.o. 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 The cancer was detected on a routine screening mammogram on 04/04/18 and was not palpable prior to diagnosis. A diagnostic mammogram from 05/03/18 show a 0.9cm group of calcifications in the left breast. A breast biopsy on 05/12/18 showed intermediate grade DCIS, ER 100%, PR negative. She underwent a lumpectomy on 05/31/18 for which pathology confirmed the cancer to be invasive mucinous adenocarcinoma, HER2 negative, ER 90%, PR negative, Ki67 20%.   I reviewed her records extensively and collaborated the history with the patient.  SUMMARY OF ONCOLOGIC HISTORY:   Carcinoma of upper-inner quadrant of left breast in female, estrogen receptor positive ([place])   05/31/2018 Surgery    Left lumpectomy: Grade 1 invasive mucinous adenocarcinoma 0.4 cm with DCIS, margins negative, ER 95%, PR 0%, Ki-67 20%, HER-2 1+ negative, T1 a N0 stage Ia    06/17/2018 Cancer Staging    Staging form: Breast, AJCC 8th Edition - Pathologic: Stage IA (pT1a, pN0, cM0,  G1, ER+, PR-, HER2-) - Signed by Eppie Gibson, MD on 06/17/2018    MEDICAL HISTORY:  Past Medical History:  Diagnosis Date  . Allergy   . Atypical pneumonia 09/25/2017  . Cancer ([place])    breast cancer; dx in 04/2018.   [name] Cholesteatoma   . Complication of anesthesia    "slow to wake up, couldn't catch my breath"- has had anesthesia since this occurence and did fine.   . 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 Essential hypertension, benign   . FH: TAH-BSO (total abdominal hysterectomy and bilateral salpingo-oophorectomy)   . Gout   . Hx of appendectomy   . Hyperlipidemia   . Kidney disease   . Osteopenia   . Paget's disease   . Pneumonia   . Stroke ([place])    TIA; resulted in having a carotid endartectomy.   . Vitamin D deficiency     SURGICAL HISTORY: Past Surgical History:  Procedure Laterality Date  . ABDOMINAL HYSTERECTOMY    . APPENDECTOMY    . BREAST LUMPECTOMY WITH RADIOACTIVE SEED LOCALIZATION Left 05/31/2018   Procedure: LEFT BREAST LUMPECTOMY WITH RADIOACTIVE SEED LOCALIZATION;  Surgeon: Fanny Skates, MD;  Location: [facility];  Service: General;  Laterality: Left;  . carotid endoectomy Left   . cataract Bilateral 02/27/14  . EYE SURGERY     catarat surgery bilaterally    SOCIAL HISTORY: Social History   Socioeconomic History  . Marital status: Widowed    Spouse name: Not on file  . Number of children: 2  . Years of education: cosemtology certificate  . Highest education level: Some college,  no degree  Occupational History  . 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 Financial resource strain: Not hard at all  . Food insecurity:    Worry: Never true    Inability: Never true  . Transportation needs:    Medical: No    Non-medical: No  Tobacco Use  . Smoking status: Former Smoker    Packs/day: 1.00    Years: 20.00    Pack years: 20.00    Types: Cigarettes    Last attempt to quit: 08/25/2009     Years since quitting: 8.8  . Smokeless tobacco: Never Used  Substance and Sexual Activity  . Alcohol use: No  . Drug use: No  . Sexual activity: Not Currently  Lifestyle  . Physical activity:    Days per week: 5 days    Minutes per session: 120 min  . Stress: Not at all  Relationships  . Social connections:    Talks on phone: More than three times a week    Gets together: More than three times a week    Attends religious service: More than 4 times per year    Active member of club or organization: Yes    Attends meetings of clubs or organizations: More than 4 times per year    Relationship status: Widowed  . Intimate partner violence:    Fear of current or ex partner: Not on file    Emotionally abused: Not on file    Physically abused: Not on file    Forced sexual activity: Not on file  Other Topics Concern  . Not on file  Social History Narrative  . Not on file    FAMILY HISTORY: Family History  Problem Relation Age of Onset  . Heart disease Mother   . Stroke Mother 14  . Heart disease Father   . Heart attack Father   . Hypertension Son   . Heart attack Paternal Aunt   . Heart disease Paternal Aunt     ALLERGIES:  is allergic to pravastatin; zetia [ezetimibe]; cephalexin; and sulfa antibiotics.  MEDICATIONS:  Current Outpatient Medications  Medication Sig Dispense Refill  . albuterol (PROVENTIL HFA;VENTOLIN HFA) 108 (90 Base) MCG/ACT inhaler Inhale 2 puffs into the lungs every 6 (six) hours as needed for wheezing or shortness of breath. 1 Inhaler 2  . amLODipine (NORVASC) 10 MG tablet Take 1 tablet (10 mg total) by mouth daily. 90 tablet 1  . aspirin 81 MG tablet Take 81 mg by mouth daily.      . cholecalciferol (VITAMIN D) 1000 UNITS tablet Take 1,000 Units by mouth daily.    . fluticasone (FLONASE) 50 MCG/ACT nasal spray Place 1 spray 2 (two) times daily as needed into both nostrils for allergies or rhinitis. (Patient taking differently: Place 1 spray into both  nostrils daily. ) 16 g 6  . Garlic 9509 MG CAPS Take 1,000 mg by mouth 2 (two) times daily.     [name] HYDROcodone-acetaminophen (NORCO) 5-325 MG tablet Take 1-2 tablets by mouth every 6 (six) hours as needed for moderate pain or severe pain. 15 tablet 0  . levocetirizine (XYZAL) 5 MG tablet TAKE 1 TABLET (5 MG TOTAL) BY MOUTH EVERY EVENING. 90 tablet 1  . rosuvastatin (CRESTOR) 10 MG tablet Take 1 tablet (10 mg total) by mouth daily. 90 tablet 1   No current facility-administered medications for this visit.     REVIEW OF SYSTEMS:   Constitutional: Denies fevers, chills  or abnormal night sweats Eyes: Denies blurriness of vision, double vision or watery eyes Ears, nose, mouth, throat, and face: Denies mucositis or sore throat Respiratory: Denies cough, dyspnea or wheezes Cardiovascular: Denies palpitation, chest discomfort or lower extremity swelling Gastrointestinal:  Denies nausea, heartburn or change in bowel habits Skin: Denies abnormal skin rashes Lymphatics: Denies new lymphadenopathy or easy bruising Neurological:Denies numbness, tingling or new weaknesses Behavioral/Psych: Mood is stable, no new changes  Breast: Denies any palpable lumps or discharge All other systems were reviewed with the patient and are negative.  PHYSICAL EXAMINATION: ECOG PERFORMANCE STATUS: 1 - Symptomatic but completely ambulatory Physical Exam: Not done since this is a WebEx visit.  LABORATORY DATA:  I have reviewed the data as listed Lab Results  Component Value Date   WBC 8.4 05/23/2018   HGB 11.5 (L) 05/23/2018   HCT 36.6 05/23/2018   MCV 91.5 05/23/2018   PLT 307 05/23/2018   Lab Results  Component Value Date   NA 139 05/23/2018   K 4.8 05/23/2018   CL 111 05/23/2018   CO2 20 (L) 05/23/2018    RADIOGRAPHIC STUDIES: I have personally reviewed the radiological reports and agreed with the findings in the report.  ASSESSMENT AND PLAN:  Carcinoma of upper-inner quadrant of left breast in  female, estrogen receptor positive ([place]) 05/31/2018:Left lumpectomy: Grade 1 invasive mucinous adenocarcinoma 0.4 cm with DCIS, margins negative, ER 95%, PR 0%, Ki-67 20%, HER-2 1+ negative, T1 a N0 stage Ia  Pathology and radiology counseling: Discussed with the patient, the details of pathology including the type of breast cancer,the clinical staging, the significance of ER, PR and HER-2/neu receptors and the implications for treatment. After reviewing the pathology in detail, we proceeded to discuss the different treatment options between surgery, radiation, and antiestrogen therapies.  Recommendation: Based on favorable prognostic features, Dr. Isidore Moos did not recommend adjuvant radiation. I recommended antiestrogen therapy with anastrozole 1 mg daily x5 to 7 years.  Anastrozole counseling:We discussed the risks and benefits of anti-estrogen therapy with aromatase inhibitors. These include but not limited to insomnia, hot flashes, mood changes, vaginal dryness, bone density loss, and weight gain. We strongly believe that the benefits far outweigh the risks. Patient understands these risks and consented to starting treatment. Planned treatment duration is 5-7 years.  She will come back in 3 months for toxicity check and survivorship care plan visit     All questions were answered. The patient knows to call the clinic with any problems, questions or concerns.    Nicholas Lose, MD 06/24/2018   I, Cloyde Reams Dorshimer, am acting as scribe for Nicholas Lose, MD.  I have reviewed the above documentation for accuracy and completeness, and I agree with the above.

## 2018-06-22 ENCOUNTER — Telehealth: Payer: Self-pay | Admitting: Family Medicine

## 2018-06-22 NOTE — Telephone Encounter (Signed)
PT is wanting to talk to Dr Lajuana Ripple directly about an oncologist.

## 2018-06-22 NOTE — Telephone Encounter (Signed)
She was asking if she should go see the oncologist on Friday.  I did recommend that she proceed with the appoint with Dr. Lindi Adie as I feel that putting the appointment off would increase her risk of being exposed to the COVID-19 pandemic.  I am certain that the clinic there is doing everything they can to minimize any exposure to the patients that are being seen.  We discussed continuing to practice excellent hand hygiene and social distancing as able.  Additionally, she reports that she is doing excellent after surgical intervention earlier this month.

## 2018-06-22 NOTE — Assessment & Plan Note (Addendum)
05/31/2018:Left lumpectomy: Grade 1 invasive mucinous adenocarcinoma 0.4 cm with DCIS, margins negative, ER 95%, PR 0%, Ki-67 20%, HER-2 1+ negative, T1 a N0 stage Ia  Pathology and radiology counseling: Discussed with the patient, the details of pathology including the type of breast cancer,the clinical staging, the significance of ER, PR and HER-2/neu receptors and the implications for treatment. After reviewing the pathology in detail, we proceeded to discuss the different treatment options between surgery, radiation, and antiestrogen therapies.  Recommendation: Based on favorable prognostic features, Dr. Isidore Moos did not recommend adjuvant radiation. I recommended antiestrogen therapy with anastrozole 1 mg daily x5 to 7 years.  Anastrozole counseling:We discussed the risks and benefits of anti-estrogen therapy with aromatase inhibitors. These include but not limited to insomnia, hot flashes, mood changes, vaginal dryness, bone density loss, and weight gain. We strongly believe that the benefits far outweigh the risks. Patient understands these risks and consented to starting treatment. Planned treatment duration is 5-7 years.  She will come back in 3 months for toxicity check and survivorship care plan visit

## 2018-06-24 ENCOUNTER — Inpatient Hospital Stay: Payer: Medicare PPO | Attending: Hematology and Oncology | Admitting: Hematology and Oncology

## 2018-06-24 ENCOUNTER — Telehealth: Payer: Self-pay | Admitting: Family Medicine

## 2018-06-24 DIAGNOSIS — Z7982 Long term (current) use of aspirin: Secondary | ICD-10-CM | POA: Insufficient documentation

## 2018-06-24 DIAGNOSIS — Z90710 Acquired absence of both cervix and uterus: Secondary | ICD-10-CM | POA: Insufficient documentation

## 2018-06-24 DIAGNOSIS — Z8673 Personal history of transient ischemic attack (TIA), and cerebral infarction without residual deficits: Secondary | ICD-10-CM | POA: Insufficient documentation

## 2018-06-24 DIAGNOSIS — Z79899 Other long term (current) drug therapy: Secondary | ICD-10-CM | POA: Insufficient documentation

## 2018-06-24 DIAGNOSIS — C50212 Malignant neoplasm of upper-inner quadrant of left female breast: Secondary | ICD-10-CM | POA: Diagnosis not present

## 2018-06-24 DIAGNOSIS — Z79811 Long term (current) use of aromatase inhibitors: Secondary | ICD-10-CM | POA: Insufficient documentation

## 2018-06-24 DIAGNOSIS — Z170 Estrogen receptor positive status [ER+]: Secondary | ICD-10-CM | POA: Diagnosis not present

## 2018-06-24 DIAGNOSIS — E785 Hyperlipidemia, unspecified: Secondary | ICD-10-CM | POA: Insufficient documentation

## 2018-06-24 DIAGNOSIS — Z90722 Acquired absence of ovaries, bilateral: Secondary | ICD-10-CM | POA: Insufficient documentation

## 2018-06-24 DIAGNOSIS — Z87891 Personal history of nicotine dependence: Secondary | ICD-10-CM | POA: Insufficient documentation

## 2018-06-24 DIAGNOSIS — I10 Essential (primary) hypertension: Secondary | ICD-10-CM | POA: Insufficient documentation

## 2018-06-24 DIAGNOSIS — M8580 Other specified disorders of bone density and structure, unspecified site: Secondary | ICD-10-CM | POA: Insufficient documentation

## 2018-06-24 MED ORDER — ANASTROZOLE 1 MG PO TABS: 1.0000 mg | ORAL_TABLET | Freq: Every day | ORAL | 3 refills | Status: DC

## 2018-06-24 NOTE — Telephone Encounter (Signed)
Carlon is calling Kathlee Nations @ Dr. Lindi Adie office to let them know the Dexa Scan results are in Epic

## 2018-06-27 ENCOUNTER — Encounter: Payer: Self-pay | Admitting: *Deleted

## 2018-06-29 ENCOUNTER — Telehealth: Payer: Self-pay | Admitting: Hematology and Oncology

## 2018-06-29 NOTE — Telephone Encounter (Signed)
Patient 3/31 appointment was marked a no show.  Left message re July appointment. Schedule mailed. Date in July is the 1st available for SCP around the 3 month request per 3/31 los.

## 2018-07-01 ENCOUNTER — Telehealth: Payer: Self-pay | Admitting: *Deleted

## 2018-07-01 NOTE — Telephone Encounter (Signed)
Return called to pt stating she needed a copy of her surgical pathology and operative note from her 05/31/2018.  Paper work printed off and mailed to pt house per her request.

## 2018-07-28 ENCOUNTER — Other Ambulatory Visit: Payer: Self-pay | Admitting: *Deleted

## 2018-07-28 MED ORDER — ANASTROZOLE 1 MG PO TABS: 1.0000 mg | ORAL_TABLET | Freq: Every day | ORAL | 3 refills | Status: DC

## 2018-08-02 ENCOUNTER — Other Ambulatory Visit: Payer: Self-pay | Admitting: *Deleted

## 2018-08-02 MED ORDER — ANASTROZOLE 1 MG PO TABS: 1.0000 mg | ORAL_TABLET | Freq: Every day | ORAL | 3 refills | Status: DC

## 2018-08-23 ENCOUNTER — Other Ambulatory Visit: Payer: Self-pay

## 2018-08-23 ENCOUNTER — Ambulatory Visit (INDEPENDENT_AMBULATORY_CARE_PROVIDER_SITE_OTHER): Payer: Medicare PPO | Admitting: Family Medicine

## 2018-08-23 ENCOUNTER — Encounter: Payer: Self-pay | Admitting: Family Medicine

## 2018-08-23 DIAGNOSIS — I10 Essential (primary) hypertension: Secondary | ICD-10-CM

## 2018-08-23 DIAGNOSIS — N184 Chronic kidney disease, stage 4 (severe): Secondary | ICD-10-CM | POA: Diagnosis not present

## 2018-08-23 DIAGNOSIS — J301 Allergic rhinitis due to pollen: Secondary | ICD-10-CM | POA: Diagnosis not present

## 2018-08-23 DIAGNOSIS — E782 Mixed hyperlipidemia: Secondary | ICD-10-CM

## 2018-08-23 DIAGNOSIS — D0512 Intraductal carcinoma in situ of left breast: Secondary | ICD-10-CM | POA: Diagnosis not present

## 2018-08-23 MED ORDER — AMLODIPINE BESYLATE 10 MG PO TABS: 10.0000 mg | ORAL_TABLET | Freq: Every day | ORAL | 3 refills | Status: DC

## 2018-08-23 MED ORDER — AZELASTINE HCL 0.1 % NA SOLN: 1.0000 | Freq: Two times a day (BID) | NASAL | 3 refills | Status: DC

## 2018-08-23 MED ORDER — ROSUVASTATIN CALCIUM 10 MG PO TABS: 10.0000 mg | ORAL_TABLET | Freq: Every day | ORAL | 3 refills | Status: DC

## 2018-08-23 NOTE — Progress Notes (Signed)
Telephone visit  Subjective: CC:HTN, HLD PCP: [name], DO ZSW:FUXN Diane Carlson is a 73 y.o. female calls for telephone consult today. Patient provides verbal consent for consult held via phone.  Location of patient: home Location of provider: [facility] Others present for call: none  1.  Hypertension with hyperlipidemia Patient reports compliance with amlodipine 10 mg daily and Crestor 10 mg daily.  Denies any chest pain, shortness of breath, dizziness or headache.  She reports she misses the gym but is trying to stay active while maintaining social distancing.  2.  Breast cancer She continues to follow-up with oncology.  She reports compliance with the Arimidex.  Plan for this medication for the next 5 to 7 years based on last office visit.  3.  Allergic rhinitis Patient reports postnasal drip with a mild cough that seems prominent in the morning time but resolves throughout the daytime.  She reports rhinorrhea.  She is compliant with both Xyzal and Flonase but does not feel that either especially helping her.  No fever.    ROS: Per HPI  Allergies  Allergen Reactions  . Pravastatin     Muscle aches  . Zetia [Ezetimibe]     mylagias  . Cephalexin Rash    Inside mouth  . Sulfa Antibiotics Rash    Including mouth   Past Medical History:  Diagnosis Date  . Allergy   . Atypical pneumonia 09/25/2017  . Cancer ([place])    breast cancer; dx in 04/2018.   [name] Cholesteatoma   . Complication of anesthesia    "slow to wake up, couldn't catch my breath"- has had anesthesia since this occurence and did fine.   . 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 Essential hypertension, benign   . FH: TAH-BSO (total abdominal hysterectomy and bilateral salpingo-oophorectomy)   . Gout   . Hx of appendectomy   . Hyperlipidemia   . Kidney disease   . Osteopenia   . Paget's disease   . Pneumonia   . Stroke ([place])    TIA; resulted in having a carotid endartectomy.   .  Vitamin D deficiency     Current Outpatient Medications:  .  albuterol (PROVENTIL HFA;VENTOLIN HFA) 108 (90 Base) MCG/ACT inhaler, Inhale 2 puffs into the lungs every 6 (six) hours as needed for wheezing or shortness of breath., Disp: 1 Inhaler, Rfl: 2 .  amLODipine (NORVASC) 10 MG tablet, Take 1 tablet (10 mg total) by mouth daily., Disp: 90 tablet, Rfl: 1 .  anastrozole (ARIMIDEX) 1 MG tablet, Take 1 tablet (1 mg total) by mouth daily., Disp: 90 tablet, Rfl: 3 .  aspirin 81 MG tablet, Take 81 mg by mouth daily.  , Disp: , Rfl:  .  cholecalciferol (VITAMIN D) 1000 UNITS tablet, Take 1,000 Units by mouth daily., Disp: , Rfl:  .  fluticasone (FLONASE) 50 MCG/ACT nasal spray, Place 1 spray 2 (two) times daily as needed into both nostrils for allergies or rhinitis. (Patient taking differently: Place 1 spray into both nostrils daily. ), Disp: 16 g, Rfl: 6 .  Garlic 2355 MG CAPS, Take 1,000 mg by mouth 2 (two) times daily. , Disp: , Rfl:  .  HYDROcodone-acetaminophen (NORCO) 5-325 MG tablet, Take 1-2 tablets by mouth every 6 (six) hours as needed for moderate pain or severe pain., Disp: 15 tablet, Rfl: 0 .  levocetirizine (XYZAL) 5 MG tablet, TAKE 1 TABLET (5 MG TOTAL) BY MOUTH EVERY EVENING., Disp: 90 tablet, Rfl: 1 .  rosuvastatin (CRESTOR) 10 MG tablet, Take 1 tablet (10 mg total) by mouth daily., Disp: 90 tablet, Rfl: 1  Assessment/ Plan: 73 y.o. female   1. Essential hypertension, benign Controlled.  I reviewed her recent BMP which showed stable CKD 4.  Continue Norvasc.  Plan for repeat labs in 3 months.  Appointment has been scheduled - amLODipine (NORVASC) 10 MG tablet; Take 1 tablet (10 mg total) by mouth daily.  Dispense: 90 tablet; Refill: 3  2. CKD (chronic kidney disease) stage 4, GFR 15-29 ml/min (HCC) Stable.  3. Mixed hyperlipidemia Stable.  Continue Crestor 10 mg daily - rosuvastatin (CRESTOR) 10 MG tablet; Take 1 tablet (10 mg total) by mouth daily.  Dispense: 90 tablet;  Refill: 3  4. Ductal carcinoma in situ (DCIS) of left breast Followed by Dr. Lindi Adie with oncology.  On medical therapy for this  5. Seasonal allergic rhinitis due to pollen Not controlled.  Replace Flonase with Astelin.  Continue Xyzal. - azelastine (ASTELIN) 0.1 % nasal spray; Place 1 spray into both nostrils 2 (two) times daily.  Dispense: 90 mL; Refill: 3   Start time: 8:00am End time: 8:07am  Total time spent on patient care (including telephone call/ virtual visit): 15 minutes  [place], [place] [phone]

## 2018-09-01 DIAGNOSIS — H10013 Acute follicular conjunctivitis, bilateral: Secondary | ICD-10-CM | POA: Diagnosis not present

## 2018-09-29 ENCOUNTER — Telehealth: Payer: Self-pay | Admitting: Adult Health

## 2018-09-29 NOTE — Telephone Encounter (Signed)
I left a message regarding video visit  °

## 2018-10-11 ENCOUNTER — Encounter: Payer: Self-pay | Admitting: Family Medicine

## 2018-10-11 ENCOUNTER — Other Ambulatory Visit: Payer: Self-pay

## 2018-10-11 ENCOUNTER — Ambulatory Visit (INDEPENDENT_AMBULATORY_CARE_PROVIDER_SITE_OTHER): Payer: Medicare PPO | Admitting: Family Medicine

## 2018-10-11 DIAGNOSIS — J0100 Acute maxillary sinusitis, unspecified: Secondary | ICD-10-CM

## 2018-10-11 DIAGNOSIS — J301 Allergic rhinitis due to pollen: Secondary | ICD-10-CM | POA: Diagnosis not present

## 2018-10-11 MED ORDER — DESLORATADINE 5 MG PO TABS: 5.0000 mg | ORAL_TABLET | Freq: Every day | ORAL | 3 refills | Status: DC

## 2018-10-11 MED ORDER — AMOXICILLIN-POT CLAVULANATE 875-125 MG PO TABS: 1.0000 | ORAL_TABLET | Freq: Two times a day (BID) | ORAL | 0 refills | Status: AC

## 2018-10-11 MED ORDER — FLUTICASONE PROPIONATE 50 MCG/ACT NA SUSP: 2.0000 | Freq: Every day | NASAL | 6 refills | Status: AC

## 2018-10-11 NOTE — Progress Notes (Signed)
Virtual Visit via telephone Note Due to COVID-19, visit is conducted virtually and was requested by patient. This visit type was conducted due to national recommendations for restrictions regarding the COVID-19 Pandemic (e.g. social distancing) in an effort to limit this patient's exposure and mitigate transmission in our community. All issues noted in this document were discussed and addressed.  A physical exam was not performed with this format.   I connected with Diane Carlson on 10/11/18 at 0815 by telephone and verified that I am speaking with the correct person using two identifiers. Diane Carlson is currently located at home and family is currently with them during visit. The provider, Monia Pouch, FNP is located in their office at time of visit.  I discussed the limitations, risks, security and privacy concerns of performing an evaluation and management service by telephone and the availability of in person appointments. I also discussed with the patient that there may be a patient responsible charge related to this service. The patient expressed understanding and agreed to proceed.  Subjective:  Patient ID: Diane Carlson, female    DOB: 11/26/1945, 73 y.o.   MRN: [id number]  Chief Complaint:  Sinus Problem   HPI: Diane Carlson is a 73 y.o. female presenting on 10/11/2018 for Sinus Problem   Pt reports 3 weeks of maxillary sinus pressure, rhinorrhea, eye pruritis with tearing, postnasal drainage, sore throat, chills, and headache. No reported fever. No confusion or weakness. States she has been taking Xyzal for her allergies but feels it is not working. She does have thick dark green rhinorrhea.   Sinus Problem This is a new problem. The current episode started 1 to 4 weeks ago. The problem has been gradually worsening since onset. There has been no fever. Her pain is at a severity of 4/10. The pain is mild. Associated symptoms include chills, congestion, coughing,  headaches, sinus pressure and a sore throat. Pertinent negatives include no diaphoresis, ear pain, hoarse voice, neck pain, shortness of breath, sneezing or swollen glands. Past treatments include spray decongestants. The treatment provided no relief.     Relevant past medical, surgical, family, and social history reviewed and updated as indicated.  Allergies and medications reviewed and updated.   Past Medical History:  Diagnosis Date  . Allergy   . Atypical pneumonia 09/25/2017  . Cancer ([place])    breast cancer; dx in 04/2018.   [name] Cholesteatoma   . Complication of anesthesia    "slow to wake up, couldn't catch my breath"- has had anesthesia since this occurence and did fine.   . 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 Essential hypertension, benign   . FH: TAH-BSO (total abdominal hysterectomy and bilateral salpingo-oophorectomy)   . Gout   . Hx of appendectomy   . Hyperlipidemia   . Kidney disease   . Osteopenia   . Paget's disease   . Pneumonia   . Stroke ([place])    TIA; resulted in having a carotid endartectomy.   . Vitamin D deficiency     Past Surgical History:  Procedure Laterality Date  . ABDOMINAL HYSTERECTOMY    . APPENDECTOMY    . BREAST LUMPECTOMY WITH RADIOACTIVE SEED LOCALIZATION Left 05/31/2018   Procedure: LEFT BREAST LUMPECTOMY WITH RADIOACTIVE SEED LOCALIZATION;  Surgeon: Fanny Skates, MD;  Location: [facility];  Service: General;  Laterality: Left;  . carotid endoectomy Left   . cataract Bilateral 02/27/14  . EYE SURGERY     catarat surgery  bilaterally    Social History   Socioeconomic History  . Marital status: Widowed    Spouse name: Not on file  . Number of children: 2  . Years of education: cosemtology certificate  . Highest education level: Some college, no degree  Occupational History  . 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 Financial resource strain: Not hard at all  . Food  insecurity    Worry: Never true    Inability: Never true  . Transportation needs    Medical: No    Non-medical: No  Tobacco Use  . Smoking status: Former Smoker    Packs/day: 1.00    Years: 20.00    Pack years: 20.00    Types: Cigarettes    Quit date: 08/25/2009    Years since quitting: 9.1  . Smokeless tobacco: Never Used  Substance and Sexual Activity  . Alcohol use: No  . Drug use: No  . Sexual activity: Not Currently  Lifestyle  . Physical activity    Days per week: 5 days    Minutes per session: 120 min  . Stress: Not at all  Relationships  . Social connections    Talks on phone: More than three times a week    Gets together: More than three times a week    Attends religious service: More than 4 times per year    Active member of club or organization: Yes    Attends meetings of clubs or organizations: More than 4 times per year    Relationship status: Widowed  . Intimate partner violence    Fear of current or ex partner: Not on file    Emotionally abused: Not on file    Physically abused: Not on file    Forced sexual activity: Not on file  Other Topics Concern  . Not on file  Social History Narrative  . Not on file    Outpatient Encounter Medications as of 10/11/2018  Medication Sig  . albuterol (PROVENTIL HFA;VENTOLIN HFA) 108 (90 Base) MCG/ACT inhaler Inhale 2 puffs into the lungs every 6 (six) hours as needed for wheezing or shortness of breath.  [name] amLODipine (NORVASC) 10 MG tablet Take 1 tablet (10 mg total) by mouth daily.  [name] amoxicillin-clavulanate (AUGMENTIN) 875-125 MG tablet Take 1 tablet by mouth 2 (two) times daily for 7 days.  [name] anastrozole (ARIMIDEX) 1 MG tablet Take 1 tablet (1 mg total) by mouth daily.  [name] aspirin 81 MG tablet Take 81 mg by mouth daily.    [name] azelastine (ASTELIN) 0.1 % nasal spray Place 1 spray into both nostrils 2 (two) times daily.  . cholecalciferol (VITAMIN D) 1000 UNITS tablet Take 1,000 Units by mouth daily.  [name] desloratadine  (CLARINEX) 5 MG tablet Take 1 tablet (5 mg total) by mouth daily.  . fluticasone (FLONASE) 50 MCG/ACT nasal spray Place 2 sprays into both nostrils daily.  . Garlic 9169 MG CAPS Take 1,000 mg by mouth 2 (two) times daily.   [name] HYDROcodone-acetaminophen (NORCO) 5-325 MG tablet Take 1-2 tablets by mouth every 6 (six) hours as needed for moderate pain or severe pain.  . rosuvastatin (CRESTOR) 10 MG tablet Take 1 tablet (10 mg total) by mouth daily.  . [DISCONTINUED] levocetirizine (XYZAL) 5 MG tablet TAKE 1 TABLET (5 MG TOTAL) BY MOUTH EVERY EVENING.   No facility-administered encounter medications on file as of 10/11/2018.     Allergies  Allergen Reactions  . Pravastatin  Muscle aches  . Zetia [Ezetimibe]     mylagias  . Cephalexin Rash    Inside mouth  . Sulfa Antibiotics Rash    Including mouth    Review of Systems  Constitutional: Positive for chills and fatigue. Negative for diaphoresis and fever.  HENT: Positive for congestion, postnasal drip, rhinorrhea, sinus pressure, sinus pain and sore throat. Negative for dental problem, drooling, ear discharge, ear pain, facial swelling, hearing loss, hoarse voice, mouth sores, nosebleeds, sneezing, tinnitus, trouble swallowing and voice change.   Eyes: Positive for discharge (clear) and itching.  Respiratory: Positive for cough. Negative for chest tightness and shortness of breath.   Cardiovascular: Negative for chest pain and palpitations.  Gastrointestinal: Negative for abdominal pain, diarrhea, nausea and vomiting.  Genitourinary: Negative for decreased urine volume.  Musculoskeletal: Negative for myalgias and neck pain.  Neurological: Positive for headaches. Negative for dizziness, tremors, seizures, syncope, facial asymmetry, speech difficulty, weakness, light-headedness and numbness.  Psychiatric/Behavioral: Negative for confusion.  All other systems reviewed and are negative.        Observations/Objective: No vital signs or  physical exam, this was a telephone or virtual health encounter.  Pt alert and oriented, answers all questions appropriately, and able to speak in full sentences.    Assessment and Plan: Rosella was seen today for sinus problem.  Diagnoses and all orders for this visit:  Seasonal allergic rhinitis due to pollen Ongoing problem. Feels Xyzal is not controlling her symptoms well. Will switch to Clarinex, pt has used this in the past with great results. Pt to continue Astelin. Report any new or worsening symptoms.  -     desloratadine (CLARINEX) 5 MG tablet; Take 1 tablet (5 mg total) by mouth daily.  Acute non-recurrent maxillary sinusitis Reported symptoms concerning for maxillary sinusitis. Due to length of symptoms and failed conservative therapy, will initiate antibiotics. Symptomatic care discussed. Frequent saline nasal sprays, adequate hydration, rest, and tylenol as needed for pain and fever control. Medications as prescribed. Report any new or worsening symptoms.  -     fluticasone (FLONASE) 50 MCG/ACT nasal spray; Place 2 sprays into both nostrils daily. -     desloratadine (CLARINEX) 5 MG tablet; Take 1 tablet (5 mg total) by mouth daily. -     amoxicillin-clavulanate (AUGMENTIN) 875-125 MG tablet; Take 1 tablet by mouth 2 (two) times daily for 7 days.     Follow Up Instructions: Return in about 2 weeks (around 10/25/2018), or if symptoms worsen or fail to improve.    I discussed the assessment and treatment plan with the patient. The patient was provided an opportunity to ask questions and all were answered. The patient agreed with the plan and demonstrated an understanding of the instructions.   The patient was advised to call back or seek an in-person evaluation if the symptoms worsen or if the condition fails to improve as anticipated.  The above assessment and management plan was discussed with the patient. The patient verbalized understanding of and has agreed to the  management plan. Patient is aware to call the clinic if symptoms persist or worsen. Patient is aware when to return to the clinic for a follow-up visit. Patient educated on when it is appropriate to go to the emergency department.    I provided 15 minutes of non-face-to-face time during this encounter. The call started at 0815. The call ended at 0830. The other time was used for coordination of care.    Monia Pouch, FNP-C [facility] [place], [place] [address] [phone]

## 2018-10-17 ENCOUNTER — Ambulatory Visit: Payer: Medicare PPO | Admitting: Family Medicine

## 2018-10-21 ENCOUNTER — Inpatient Hospital Stay: Payer: Medicare PPO | Attending: Adult Health | Admitting: Adult Health

## 2018-10-21 ENCOUNTER — Encounter: Payer: Self-pay | Admitting: Adult Health

## 2018-10-21 DIAGNOSIS — Z87891 Personal history of nicotine dependence: Secondary | ICD-10-CM

## 2018-10-21 DIAGNOSIS — C50212 Malignant neoplasm of upper-inner quadrant of left female breast: Secondary | ICD-10-CM

## 2018-10-21 DIAGNOSIS — Z79811 Long term (current) use of aromatase inhibitors: Secondary | ICD-10-CM | POA: Diagnosis not present

## 2018-10-21 DIAGNOSIS — Z170 Estrogen receptor positive status [ER+]: Secondary | ICD-10-CM

## 2018-10-21 NOTE — Progress Notes (Signed)
CLINIC:  Survivorship   I connected with Alexis Frock on 10/21/18 at 10:00 AM EDT by mychart video, however due to technical difficulties with my chart video we transitioned to telephone and verified that I am speaking with the correct person using two identifiers.   I discussed the limitations, risks, security and privacy concerns of performing an evaluation and management service by telephone/virtually and the availability of in person appointments.  I also discussed with the patient that there may be a patient responsible charge related to this service. The patient expressed understanding and agreed to proceed.    REASON FOR VISIT:  Routine follow-up post-treatment for a recent history of breast cancer.  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 Bautch presents to the [facility] today for our initial meeting to review her survivorship care plan detailing her treatment course for breast cancer, as well as monitoring long-term side effects of that treatment, education regarding health maintenance, screening, and overall wellness and health promotion.     Overall, Ms. Hendry reports feeling quite well since completing her radiation therapy approximately 3 months ago.  She is taking Anastrozole daily.  She has an occasional hot flashes, but otherwise denies any arthralgias or vaginal dryness.    Sybrina walks twice a day.  She is overall feeling well.  She  does Zumba twice a week on her phone.    REVIEW OF SYSTEMS:  Review of Systems  Constitutional: Negative for appetite change, chills, fatigue, fever and unexpected weight change.  HENT:   Negative for hearing loss, lump/mass, mouth sores and trouble swallowing.   Eyes: Negative for eye problems and icterus.  Respiratory: Negative for chest tightness, cough and shortness of breath.   Cardiovascular: Negative for chest pain, leg swelling and palpitations.  Gastrointestinal: Negative for abdominal distention, abdominal pain, constipation, diarrhea, nausea and vomiting.  Endocrine: Negative for hot flashes.  Musculoskeletal: Negative for arthralgias.  Skin: Negative for itching and rash.  Neurological: Negative for dizziness, extremity weakness, headaches and numbness.  Hematological: Negative for adenopathy. Does not bruise/bleed easily.  Psychiatric/Behavioral: Negative for depression. The patient is not nervous/anxious.    Breast: Denies any new nodularity, masses, tenderness, nipple changes, or nipple discharge.      ONCOLOGY TREATMENT TEAM:  1. Surgeon:  Dr[facility] Dalbert [facility] at [facility] 2. Medical Oncologist: Dr. Lindi Adie      PAST MEDICAL/SURGICAL HISTORY:  Past Medical History:  Diagnosis Date  . Allergy   . Atypical pneumonia 09/25/2017  . Cancer ([place])    breast cancer; dx in 04/2018.   [name] Cholesteatoma   . Complication of anesthesia    "slow to wake up, couldn't catch my breath"- has had anesthesia since this occurence and did fine.   . 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 Essential hypertension, benign   . FH: TAH-BSO (total abdominal hysterectomy and bilateral salpingo-oophorectomy)   . Gout   .  Hx of appendectomy   . Hyperlipidemia   . Kidney disease   . Osteopenia   . Paget's disease   . Pneumonia   . Stroke ([place])    TIA; resulted in having a carotid endartectomy.   . Vitamin D deficiency    Past Surgical History:  Procedure  Laterality Date  . ABDOMINAL HYSTERECTOMY    . APPENDECTOMY    . BREAST LUMPECTOMY WITH RADIOACTIVE SEED LOCALIZATION Left 05/31/2018   Procedure: LEFT BREAST LUMPECTOMY WITH RADIOACTIVE SEED LOCALIZATION;  Surgeon: Fanny Skates, MD;  Location: [facility];  Service: General;  Laterality: Left;  . carotid endoectomy Left   . cataract Bilateral 02/27/14  . EYE SURGERY     catarat surgery bilaterally     ALLERGIES:  Allergies  Allergen Reactions  . Pravastatin     Muscle aches  . Zetia [Ezetimibe]     mylagias  . Cephalexin Rash    Inside mouth  . Sulfa Antibiotics Rash    Including mouth     CURRENT MEDICATIONS:  Outpatient Encounter Medications as of 10/21/2018  Medication Sig  . albuterol (PROVENTIL HFA;VENTOLIN HFA) 108 (90 Base) MCG/ACT inhaler Inhale 2 puffs into the lungs every 6 (six) hours as needed for wheezing or shortness of breath.  [name] amLODipine (NORVASC) 10 MG tablet Take 1 tablet (10 mg total) by mouth daily.  [name] anastrozole (ARIMIDEX) 1 MG tablet Take 1 tablet (1 mg total) by mouth daily.  [name] aspirin 81 MG tablet Take 81 mg by mouth daily.    [name] azelastine (ASTELIN) 0.1 % nasal spray Place 1 spray into both nostrils 2 (two) times daily.  . cholecalciferol (VITAMIN D) 1000 UNITS tablet Take 1,000 Units by mouth daily.  [name] desloratadine (CLARINEX) 5 MG tablet Take 1 tablet (5 mg total) by mouth daily.  . fluticasone (FLONASE) 50 MCG/ACT nasal spray Place 2 sprays into both nostrils daily.  . Garlic 1448 MG CAPS Take 1,000 mg by mouth 2 (two) times daily.   [name] HYDROcodone-acetaminophen (NORCO) 5-325 MG tablet Take 1-2 tablets by mouth every 6 (six) hours as needed for moderate pain or severe pain.  . rosuvastatin (CRESTOR) 10 MG tablet Take 1 tablet (10 mg total) by mouth daily.   No facility-administered encounter medications on file as of 10/21/2018.      ONCOLOGIC FAMILY HISTORY:  Family History  Problem Relation Age of Onset  . Heart disease Mother   . Stroke Mother  102  . Heart disease Father   . Heart attack Father   . Hypertension Son   . Heart attack Paternal Aunt   . Heart disease Paternal Aunt      GENETIC COUNSELING/TESTING: Not at this time  SOCIAL HISTORY:  Social History   Socioeconomic History  . Marital status: Widowed    Spouse name: Not on file  . Number of children: 2  . Years of education: cosemtology certificate  . Highest education level: Some college, no degree  Occupational History  . 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 Financial resource strain: Not hard at all  . Food insecurity    Worry: Never true    Inability: Never true  . Transportation needs    Medical: No    Non-medical: No  Tobacco Use  . Smoking status: Former Smoker    Packs/day: 1.00    Years: 20.00    Pack years: 20.00    Types: Cigarettes    Quit date: 08/25/2009  Years since quitting: 9.1  . Smokeless tobacco: Never Used  Substance and Sexual Activity  . Alcohol use: No  . Drug use: No  . Sexual activity: Not Currently  Lifestyle  . Physical activity    Days per week: 5 days    Minutes per session: 120 min  . Stress: Not at all  Relationships  . Social connections    Talks on phone: More than three times a week    Gets together: More than three times a week    Attends religious service: More than 4 times per year    Active member of club or organization: Yes    Attends meetings of clubs or organizations: More than 4 times per year    Relationship status: Widowed  . Intimate partner violence    Fear of current or ex partner: Not on file    Emotionally abused: Not on file    Physically abused: Not on file    Forced sexual activity: Not on file  Other Topics Concern  . Not on file  Social History Narrative  . Not on file      OBJECTIVE:  Patient sounds well.  She is in no apparent distress.  Breathing is non labored.  Mood and behavior are normal.    LABORATORY DATA:  None for  this visit.  DIAGNOSTIC IMAGING:  None for this visit.      ASSESSMENT AND PLAN:  Ms.. Griffing is a pleasant 73 y.o. female with Stage IA left breast invasive mucinous, ER+/PR+, diagnosed in 05/2018, treated with lumpectomy, and anti-estrogen therapy with Anastrozole beginning in 06/2018.  She presents to the Survivorship Clinic for our initial meeting and routine follow-up post-completion of treatment for breast cancer.    1. Stage IA left breast cancer:  Ms. Darius is continuing to recover from definitive treatment for breast cancer. She will follow-up with her medical oncologist, Dr. Lindi Adie in 05/2019 with history and physical exam per surveillance protocol.  She will continue her anti-estrogen therapy with Anastrozole. Thus far, she is tolerating the Anastrozole well, with minimal side effects. She was instructed to make Dr. Lindi Adie or myself aware if she begins to experience any worsening side effects of the medication and I could see her back in clinic to help manage those side effects, as needed. T  Today, a comprehensive survivorship care plan and treatment summary was reviewed with the patient today detailing her breast cancer diagnosis, treatment course, potential late/long-term effects of treatment, appropriate follow-up care with recommendations for the future, and patient education resources.  A copy of this summary, along with a letter will be sent to the patient's primary care provider via mail/fax/In Basket message after today's visit.    2. Bone health:  Given Ms. Magnone's age/history of breast cancer and her current treatment regimen including anti-estrogen therapy with Anastrozole, she is at risk for bone demineralization.  Her last DEXA scan was last year with her PCP.  She isn't sure what her results were but she thinks they were ok.  In the meantime, she was encouraged to increase her consumption of foods rich in calcium, as well as increase her weight-bearing activities.  She was  given education on specific activities to promote bone health.  3. Cancer screening:  Due to Ms. Mannes's history and her age, she should receive screening for skin cancers, colon cancer, and gynecologic cancers.  Emmary meets criteria for lung cancer screening.  Her last CT chest was in 02/2018 and a 3-6  month f/u was recommended.  Due to her 43 pack year tobacco history, I recommended she get this follow up chest CT done, and if negative, I recommend she have annual lung cancer screening CT. The information and recommendations are listed on the patient's comprehensive care plan/treatment summary and were reviewed in detail with the patient.    4. Health maintenance and wellness promotion: Ms. Harling was encouraged to consume 5-7 servings of fruits and vegetables per day. We reviewed the "Nutrition Rainbow" handout, as well as the handout "Take Control of Your Health and Reduce Your Cancer Risk" from [place].  She was also encouraged to engage in moderate to vigorous exercise for 30 minutes per day most days of the week. WShe was instructed to limit her alcohol consumption and continue to abstain from tobacco use.     5. Support services/counseling: It is not uncommon for this period of the patient's cancer care trajectory to be one of many emotions and stressors.  Due to [place] our cancer support services have been transitioned to virtual support.  She was given information regarding our available services and encouraged to contact me with any questions or for help enrolling in any of our support group/programs.    Dispo:   -Return to cancer center 05/2019 for f/u with Dr. Lindi Adie  -Mammogram due in 03/2019 -Follow up with Dr. Dalbert Batman in 10/2018 -She is welcome to return back to the Survivorship Clinic at any time; no additional follow-up needed at this time.  -Consider referral back to survivorship as a long-term survivor for continued surveillance  A total of (20) minutes of  face-to-face video visit time was spent with this patient with greater than 50% of that time in counseling and care-coordination.   Gardenia Phlegm, NP Survivorship Program [facility] [phone]   Note: PRIMARY CARE PROVIDER Ronnie Doss [place], [place] [phone]

## 2018-10-24 ENCOUNTER — Telehealth: Payer: Self-pay | Admitting: Hematology and Oncology

## 2018-10-24 NOTE — Telephone Encounter (Signed)
I talk with patient regarding schedule  

## 2018-11-24 ENCOUNTER — Other Ambulatory Visit: Payer: Self-pay

## 2018-11-25 ENCOUNTER — Ambulatory Visit: Payer: Self-pay | Admitting: Family Medicine

## 2018-12-02 IMAGING — DX DG CHEST 2V
2 series · 2 of 2 positions shown · non-contrast
Comparison: November 10, 2017

CLINICAL DATA: Cough and congestion

EXAM:
CHEST - 2 VIEW

[chest pa]
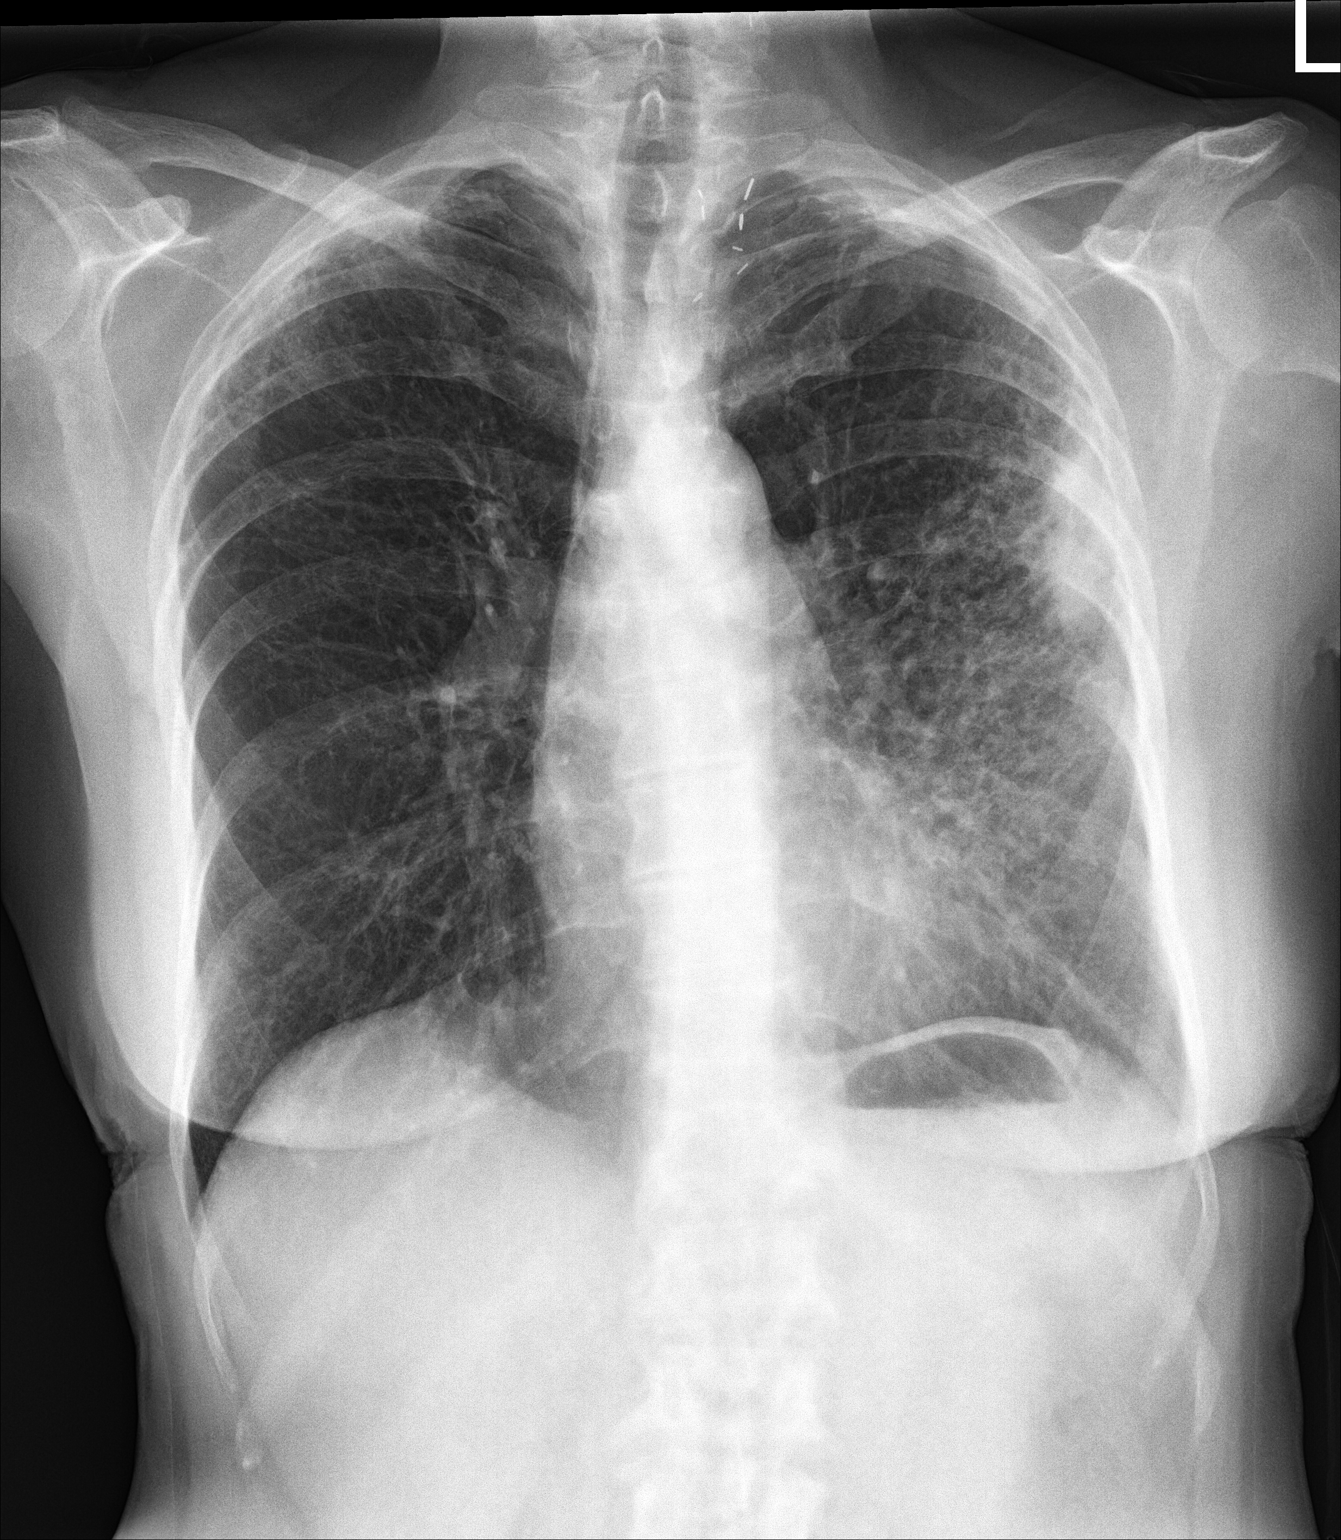

[chest lat]
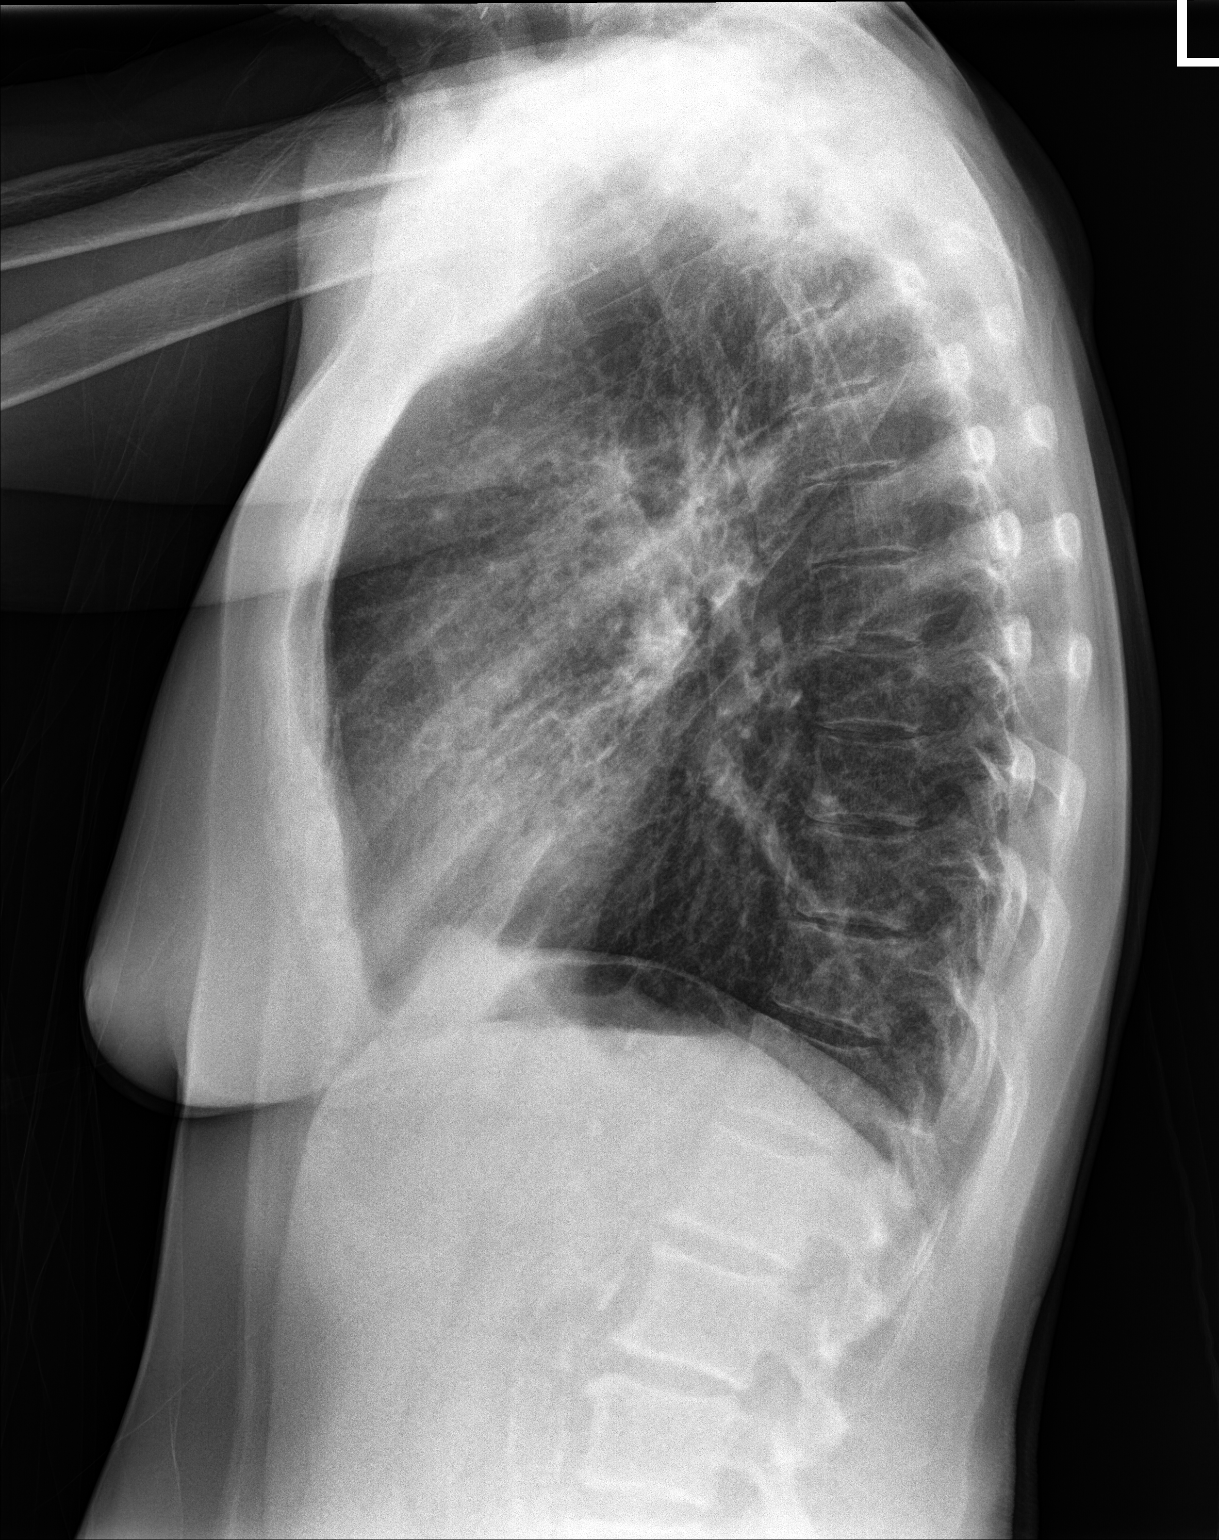

[2 of 2 positions shown; findings below may reference images not displayed]

FINDINGS: There is airspace consolidation throughout much of the left upper
lobe and superior lingula. Consolidation is greatest in the left
upper lobe, likely in an axillary subsegment. There is chronic
apical scarring and thickening bilaterally, slightly more on the
right than on the left. Lungs elsewhere are clear. Heart size and
pulmonary vascularity are normal. No adenopathy. Postoperative
changes are noted in the left superior paratracheal region, stable.
IMPRESSION: Extensive left upper lobe and superior lingula airspace opacity
consistent with pneumonia. Stable scarring in the apices and
periphery of the upper lobes. Stable cardiac silhouette. No
adenopathy evident.

Followup PA and lateral chest radiographs recommended in 3-4 weeks
following trial of antibiotic therapy to ensure resolution and
exclude underlying malignancy.

These results will be called to the ordering clinician or
representative by the Radiologist Assistant, and communication
documented in the PACS or zVision Dashboard.

## 2018-12-06 ENCOUNTER — Other Ambulatory Visit: Payer: Medicare PPO

## 2018-12-06 ENCOUNTER — Other Ambulatory Visit: Payer: Self-pay

## 2018-12-06 DIAGNOSIS — N184 Chronic kidney disease, stage 4 (severe): Secondary | ICD-10-CM | POA: Diagnosis not present

## 2018-12-09 ENCOUNTER — Other Ambulatory Visit: Payer: Self-pay | Admitting: Family Medicine

## 2018-12-09 ENCOUNTER — Ambulatory Visit (INDEPENDENT_AMBULATORY_CARE_PROVIDER_SITE_OTHER): Payer: Medicare PPO | Admitting: *Deleted

## 2018-12-09 DIAGNOSIS — Z0000 Encounter for general adult medical examination without abnormal findings: Secondary | ICD-10-CM | POA: Diagnosis not present

## 2018-12-09 NOTE — Progress Notes (Addendum)
MEDICARE ANNUAL WELLNESS VISIT  12/09/2018  Telephone Visit Disclaimer This Medicare AWV was conducted by telephone due to national recommendations for restrictions regarding the COVID-19 Pandemic (e.g. social distancing).  I verified, using two identifiers, that I am speaking with Diane Carlson or their authorized healthcare agent. I discussed the limitations, risks, security, and privacy concerns of performing an evaluation and management service by telephone and the potential availability of an in-person appointment in the future. The patient expressed understanding and agreed to proceed.   Subjective:  Diane Carlson is a 73 y.o. female patient of [name], DO who had a Medicare Annual Wellness Visit today via telephone. [name] is Retired and lives alone. She has 2 children. She reports that she is socially active and does interact with friends/family regularly. She is moderately physically active and enjoys zumba, dancing, and going to church.  Patient Care Team: [name], DO as PCP - General (Family Medicine) Rexene Agent, MD as Attending Physician (Nephrology) Nicholas Lose, MD as Consulting Physician (Hematology and Oncology) Fanny Skates, MD as Consulting Physician (General Surgery)  Advanced Directives 12/09/2018 05/23/2018 09/25/2017 08/04/2017 07/29/2016  Does Patient Have a Medical Advance Directive? No No No No No  Would patient like information on creating a medical advance directive? No - Patient declined No - Patient declined No - Patient declined Yes (MAU/Ambulatory/Procedural Areas - Information given) Yes (MAU/Ambulatory/Procedural Areas - Information given)    Hospital Utilization Over the Past 12 Months: # of hospitalizations or ER visits: 0 # of surgeries: 1  Review of Systems    Patient reports that her overall health is unchanged compared to last year.  History obtained from chart review and the patient  Patient Reported Readings  (BP, Pulse, CBG, Weight, etc) none  Pain Assessment       Current Medications & Allergies (verified) Allergies as of 12/09/2018      Reactions   Pravastatin    Muscle aches   Zetia [ezetimibe]    mylagias   Cephalexin Rash   Inside mouth   Sulfa Antibiotics Rash   Including mouth      Medication List       Accurate as of December 09, 2018 11:02 AM. If you have any questions, ask your nurse or doctor.        amLODipine 10 MG tablet Commonly known as: NORVASC Take 1 tablet (10 mg total) by mouth daily.   anastrozole 1 MG tablet Commonly known as: ARIMIDEX Take 1 tablet (1 mg total) by mouth daily.   aspirin 81 MG tablet Take 81 mg by mouth daily.   cholecalciferol 1000 units tablet Commonly known as: VITAMIN D Take 1,000 Units by mouth daily.   desloratadine 5 MG tablet Commonly known as: Clarinex Take 1 tablet (5 mg total) by mouth daily.   fluticasone 50 MCG/ACT nasal spray Commonly known as: FLONASE Place 2 sprays into both nostrils daily.   Garlic 8676 MG Caps Take 1,000 mg by mouth 2 (two) times daily.   rosuvastatin 10 MG tablet Commonly known as: CRESTOR Take 1 tablet (10 mg total) by mouth daily.       History (reviewed): Past Medical History:  Diagnosis Date  . Allergy   . Atypical pneumonia 09/25/2017  . Cancer ([place])    breast cancer; dx in 04/2018.   [name] Cholesteatoma   . Complication of anesthesia    "slow to wake up, couldn't catch my breath"- has had anesthesia since this  occurence and did fine.   . 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 Essential hypertension, benign   . FH: TAH-BSO (total abdominal hysterectomy and bilateral salpingo-oophorectomy)   . Gout   . Hx of appendectomy   . Hyperlipidemia   . Kidney disease   . Osteopenia   . Paget's disease   . Pneumonia   . Stroke ([place])    TIA; resulted in having a carotid endartectomy.   . Vitamin D deficiency    Past Surgical History:  Procedure  Laterality Date  . ABDOMINAL HYSTERECTOMY    . APPENDECTOMY    . BREAST LUMPECTOMY WITH RADIOACTIVE SEED LOCALIZATION Left 05/31/2018   Procedure: LEFT BREAST LUMPECTOMY WITH RADIOACTIVE SEED LOCALIZATION;  Surgeon: Fanny Skates, MD;  Location: [facility];  Service: General;  Laterality: Left;  . carotid endoectomy Left   . cataract Bilateral 02/27/14  . EYE SURGERY     catarat surgery bilaterally   Family History  Problem Relation Age of Onset  . Heart disease Mother   . Stroke Mother 76  . Heart disease Father   . Heart attack Father   . Hypertension Son   . Heart attack Paternal Aunt   . Heart disease Paternal Aunt    Social History   Socioeconomic History  . Marital status: Widowed    Spouse name: Not on file  . Number of children: 2  . Years of education: cosemtology certificate  . Highest education level: Some college, no degree  Occupational History  . 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 Financial resource strain: Not hard at all  . Food insecurity    Worry: Never true    Inability: Never true  . Transportation needs    Medical: No    Non-medical: No  Tobacco Use  . Smoking status: Former Smoker    Packs/day: 1.00    Years: 20.00    Pack years: 20.00    Types: Cigarettes    Quit date: 08/25/2009    Years since quitting: 9.2  . Smokeless tobacco: Never Used  Substance and Sexual Activity  . Alcohol use: No  . Drug use: No  . Sexual activity: Not Currently  Lifestyle  . Physical activity    Days per week: 5 days    Minutes per session: 120 min  . Stress: Not at all  Relationships  . Social connections    Talks on phone: More than three times a week    Gets together: More than three times a week    Attends religious service: More than 4 times per year    Active member of club or organization: Yes    Attends meetings of clubs or organizations: More than 4 times per year    Relationship status: Widowed  Other  Topics Concern  . Not on file  Social History Narrative   Lives alone   She exercises everyday and enjoys zumba    Activities of Daily Living In your present state of health, do you have any difficulty performing the following activities: 12/09/2018 05/23/2018  Hearing? N Y  Vision? N N  Comment wears reading glasses -  Difficulty concentrating or making decisions? N N  Walking or climbing stairs? N N  Dressing or bathing? N N  Doing errands, shopping? N N  Preparing Food and eating ? N -  Using the Toilet? N -  In the past six months, have you accidently leaked  urine? N -  Do you have problems with loss of bowel control? N -  Managing your Medications? N -  Managing your Finances? N -  Housekeeping or managing your Housekeeping? N -  Some recent data might be hidden    Patient Education/ Literacy    Exercise Current Exercise Habits: Structured exercise class;Home exercise routine, Type of exercise: walking;Other - see comments(Goes to the gym and also does zumba), Time (Minutes): 60(120), Frequency (Times/Week): 5, Weekly Exercise (Minutes/Week): 300, Intensity: Moderate  Diet Patient reports consuming 3 meals a day and 1 snack(s) a day Patient reports that her primary diet is: Regular Patient reports that she does have regular access to food.   Depression Screen PHQ 2/9 Scores 12/09/2018 05/23/2018 02/21/2018 01/19/2018 01/14/2018 11/10/2017 10/11/2017  PHQ - 2 Score 0 0 0 0 0 0 0  PHQ- 9 Score 0 0 0 - - - -     Fall Risk Fall Risk  12/09/2018 05/23/2018 02/21/2018 01/19/2018 01/14/2018  Falls in the past year? 0 - 0 No No  Number falls in past yr: - 0 - - -     Objective:  Hanni Dahlia Client seemed alert and oriented and she participated appropriately during our telephone visit.  Blood Pressure Weight BMI  BP Readings from Last 3 Encounters:  05/31/18 129/74  05/23/18 135/63  05/23/18 139/74   Wt Readings from Last 3 Encounters:  05/31/18 109 lb (49.4 kg)   05/23/18 109 lb 3.2 oz (49.5 kg)  05/23/18 111 lb (50.3 kg)   BMI Readings from Last 1 Encounters:  05/31/18 19.94 kg/m    *Unable to obtain current vital signs, weight, and BMI due to telephone visit type  Hearing/Vision  . Toleen did not seem to have difficulty with hearing/understanding during the telephone conversation . Reports that she has had a formal eye exam by an eye care professional within the past year . Reports that she has not had a formal hearing evaluation within the past year *Unable to fully assess hearing and vision during telephone visit type  Cognitive Function: 6CIT Screen 12/09/2018  What Year? 0 points  What month? 0 points  What time? 0 points  Count back from 20 0 points  Months in reverse 0 points  Repeat phrase 0 points  Total Score 0   (Normal:0-7, Significant for Dysfunction: >8)  Normal Cognitive Function Screening: Yes   Immunization & Health Maintenance Record Immunization History  Administered Date(s) Administered  . Influenza Whole 12/28/2009  . Influenza, High Dose Seasonal PF 03/02/2017  . Influenza,inj,Quad PF,6+ Mos 01/15/2014, 01/11/2015, 01/07/2016, 02/21/2018  . Pneumococcal Conjugate-13 08/31/2014  . Pneumococcal Polysaccharide-23 08/25/2012  . Tdap 09/04/2010  . Zoster 03/10/2013    Health Maintenance  Topic Date Due  . DEXA SCAN  09/09/2018  . INFLUENZA VACCINE  10/29/2018  . MAMMOGRAM  04/05/2019  . TETANUS/TDAP  09/03/2020  . COLONOSCOPY  02/26/2027  . Hepatitis C Screening  Completed  . PNA vac Low Risk Adult  Completed       Assessment  This is a routine wellness examination for [facility].  Health Maintenance: Due or Overdue Health Maintenance Due  Topic Date Due  . DEXA SCAN  09/09/2018  . INFLUENZA VACCINE  10/29/2018    Yulitza Teresa Nicodemus does not need a referral for Community Assistance: Care Management:   no Social Work:    no Prescription Assistance:  no Nutrition/Diabetes Education:  no    Plan:  Personalized Goals Goals Addressed  This Visit's Progress   . AWV       12/09/2018 AWV Goal: Fall Prevention  . Over the next year, patient will decrease their risk for falls by: o Using assistive devices, such as a cane or walker, as needed o Identifying fall risks within their home and correcting them by: - Removing throw rugs - Adding handrails to stairs or ramps - Removing clutter and keeping a clear pathway throughout the home - Increasing light, especially at night - Adding shower handles/bars - Raising toilet seat o Identifying potential personal risk factors for falls: - Medication side effects - Incontinence/urgency - Vestibular dysfunction - Hearing loss - Musculoskeletal disorders - Neurological disorders - Orthostatic hypotension        Personalized Health Maintenance & Screening Recommendations  Influenza vaccine Bone densitometry screening  Lung Cancer Screening Recommended: no (Low Dose CT Chest recommended if Age 10-80 years, 30 pack-year currently smoking OR have quit w/in past 15 years) Hepatitis C Screening recommended: no HIV Screening recommended: no  Advanced Directives: Written information was not prepared per patient's request.  Referrals & Orders No orders of the defined types were placed in this encounter.   Follow-up Plan . Follow-up with [name], DO as planned . Schedule appointment for flu shot . Discuss Bone Density at next appointment   I have personally reviewed and noted the following in the patient's chart:   . Medical and social history . Use of alcohol, tobacco or illicit drugs  . Current medications and supplements . Functional ability and status . Nutritional status . Physical activity . Advanced directives . List of other physicians . Hospitalizations, surgeries, and ER visits in previous 12 months . Vitals . Screenings to include cognitive, depression, and falls . Referrals and  appointments  In addition, I have reviewed and discussed with Asianae Dahlia Client certain preventive protocols, quality metrics, and best practice recommendations. A written personalized care plan for preventive services as well as general preventive health recommendations is available and can be mailed to the patient at her request.      Gareth Morgan, LPN  09/26/5282

## 2018-12-13 DIAGNOSIS — N184 Chronic kidney disease, stage 4 (severe): Secondary | ICD-10-CM | POA: Diagnosis not present

## 2018-12-13 DIAGNOSIS — N2581 Secondary hyperparathyroidism of renal origin: Secondary | ICD-10-CM | POA: Diagnosis not present

## 2018-12-13 DIAGNOSIS — I129 Hypertensive chronic kidney disease with stage 1 through stage 4 chronic kidney disease, or unspecified chronic kidney disease: Secondary | ICD-10-CM | POA: Diagnosis not present

## 2018-12-13 DIAGNOSIS — D631 Anemia in chronic kidney disease: Secondary | ICD-10-CM | POA: Diagnosis not present

## 2018-12-15 ENCOUNTER — Other Ambulatory Visit: Payer: Self-pay | Admitting: Family Medicine

## 2018-12-15 DIAGNOSIS — E782 Mixed hyperlipidemia: Secondary | ICD-10-CM

## 2018-12-15 MED ORDER — ROSUVASTATIN CALCIUM 10 MG PO TABS: 10.0000 mg | ORAL_TABLET | Freq: Every day | ORAL | 0 refills | Status: DC

## 2018-12-15 NOTE — Telephone Encounter (Signed)
15 day supply sent to [facility]

## 2018-12-26 ENCOUNTER — Other Ambulatory Visit: Payer: Self-pay

## 2018-12-27 ENCOUNTER — Ambulatory Visit (INDEPENDENT_AMBULATORY_CARE_PROVIDER_SITE_OTHER): Payer: Medicare PPO | Admitting: Family Medicine

## 2018-12-27 ENCOUNTER — Encounter: Payer: Self-pay | Admitting: Family Medicine

## 2018-12-27 VITALS — BP 128/76 | HR 90 | Temp 97.9°F | Ht 62.0 in | Wt 108.0 lb

## 2018-12-27 DIAGNOSIS — N184 Chronic kidney disease, stage 4 (severe): Secondary | ICD-10-CM | POA: Diagnosis not present

## 2018-12-27 DIAGNOSIS — E782 Mixed hyperlipidemia: Secondary | ICD-10-CM | POA: Diagnosis not present

## 2018-12-27 DIAGNOSIS — I10 Essential (primary) hypertension: Secondary | ICD-10-CM | POA: Diagnosis not present

## 2018-12-27 DIAGNOSIS — Z23 Encounter for immunization: Secondary | ICD-10-CM

## 2018-12-27 LAB — LIPID PANEL
Chol/HDL Ratio: 2.4 ratio (ref 0.0–4.4)
Cholesterol, Total: 143 mg/dL (ref 100–199)
HDL: 59 mg/dL (ref >39)
LDL Chol Calc (NIH): 61 mg/dL (ref 0–99)
Triglycerides: 133 mg/dL (ref 0–149)
VLDL Cholesterol Cal: 23 mg/dL (ref 5–40)

## 2018-12-27 LAB — BASIC METABOLIC PANEL
BUN/Creatinine Ratio: 12 (ref 12–28)
BUN: 28 mg/dL — ABNORMAL HIGH (ref 8–27)
CO2: 22 mmol/L (ref 20–29)
Calcium: 9.9 mg/dL (ref 8.7–10.3)
Chloride: 104 mmol/L (ref 96–106)
Creatinine, Ser: 2.42 mg/dL — ABNORMAL HIGH (ref 0.57–1.00)
GFR calc Af Amer: 23 mL/min/{1.73_m2} — ABNORMAL LOW (ref >59)
GFR calc non Af Amer: 20 mL/min/{1.73_m2} — ABNORMAL LOW (ref >59)
Glucose: 99 mg/dL (ref 65–99)
Potassium: 4.9 mmol/L (ref 3.5–5.2)
Sodium: 142 mmol/L (ref 134–144)

## 2018-12-27 MED ORDER — ROSUVASTATIN CALCIUM 10 MG PO TABS: 10.0000 mg | ORAL_TABLET | Freq: Every day | ORAL | 3 refills | Status: DC

## 2018-12-27 NOTE — Progress Notes (Signed)
Subjective: CC: f/u HTN w/ CKD4 PCP: Diane Norlander, DO LHT:DSKA Diane Carlson is a 73 y.o. female presenting to clinic today for:  1. Hypertension with hyperlipidemia, CKD4 Patient reports compliance with Norvasc 10 mg daily, Crestor 10 mg daily.  No chest pain, shortness of breath (outside of her normal COPD), lower extremity edema, dizziness or falls.  She followed up with her nephrologist, Dr. Carmina Miller, about 2 weeks ago and had labs done.  She notes that they were baseline for her.  ROS: Per HPI  Allergies  Allergen Reactions  . Pravastatin     Muscle aches  . Zetia [Ezetimibe]     mylagias  . Cephalexin Rash    Inside mouth  . Sulfa Antibiotics Rash    Including mouth   Past Medical History:  Diagnosis Date  . Allergy   . Atypical pneumonia 09/25/2017  . Cancer ([place])    breast cancer; dx in 04/2018.   [name] Cholesteatoma   . Complication of anesthesia    "slow to wake up, couldn't catch my breath"- has had anesthesia since this occurence and did fine.   . 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 Essential hypertension, benign   . FH: TAH-BSO (total abdominal hysterectomy and bilateral salpingo-oophorectomy)   . Gout   . Hx of appendectomy   . Hyperlipidemia   . Kidney disease   . Osteopenia   . Paget's disease   . Pneumonia   . Stroke ([place])    TIA; resulted in having a carotid endartectomy.   . Vitamin D deficiency     Current Outpatient Medications:  .  amLODipine (NORVASC) 10 MG tablet, Take 1 tablet (10 mg total) by mouth daily., Disp: 90 tablet, Rfl: 3 .  anastrozole (ARIMIDEX) 1 MG tablet, Take 1 tablet (1 mg total) by mouth daily., Disp: 90 tablet, Rfl: 3 .  aspirin 81 MG tablet, Take 81 mg by mouth daily.  , Disp: , Rfl:  .  cholecalciferol (VITAMIN D) 1000 UNITS tablet, Take 1,000 Units by mouth daily., Disp: , Rfl:  .  fluticasone (FLONASE) 50 MCG/ACT nasal spray, Place 2 sprays into both nostrils daily., Disp: 16 g, Rfl: 6 .   Garlic 7681 MG CAPS, Take 1,000 mg by mouth 2 (two) times daily. , Disp: , Rfl:  .  levocetirizine (XYZAL) 5 MG tablet, TAKE 1 TABLET EVERY EVENING, Disp: 90 tablet, Rfl: 3 .  rosuvastatin (CRESTOR) 10 MG tablet, Take 1 tablet (10 mg total) by mouth daily., Disp: 15 tablet, Rfl: 0 .  desloratadine (CLARINEX) 5 MG tablet, Take 1 tablet (5 mg total) by mouth daily., Disp: 30 tablet, Rfl: 3 Social History   Socioeconomic History  . Marital status: Widowed    Spouse name: Not on file  . Number of children: 2  . Years of education: cosemtology certificate  . Highest education level: Some college, no degree  Occupational History  . 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 Financial resource strain: Not hard at all  . Food insecurity    Worry: Never true    Inability: Never true  . Transportation needs    Medical: No    Non-medical: No  Tobacco Use  . Smoking status: Former Smoker    Packs/day: 1.00    Years: 20.00    Pack years: 20.00    Types: Cigarettes    Quit date: 08/25/2009    Years since quitting: 9.3  .  Smokeless tobacco: Never Used  Substance and Sexual Activity  . Alcohol use: No  . Drug use: No  . Sexual activity: Not Currently  Lifestyle  . Physical activity    Days per week: 5 days    Minutes per session: 120 min  . Stress: Not at all  Relationships  . Social connections    Talks on phone: More than three times a week    Gets together: More than three times a week    Attends religious service: More than 4 times per year    Active member of club or organization: Yes    Attends meetings of clubs or organizations: More than 4 times per year    Relationship status: Widowed  . Intimate partner violence    Fear of current or ex partner: Not on file    Emotionally abused: Not on file    Physically abused: Not on file    Forced sexual activity: Not on file  Other Topics Concern  . Not on file  Social History Narrative    Lives alone   She exercises everyday and enjoys zumba   Family History  Problem Relation Age of Onset  . Heart disease Mother   . Stroke Mother 22  . Heart disease Father   . Heart attack Father   . Hypertension Son   . Heart attack Paternal Aunt   . Heart disease Paternal Aunt     Objective: Office vital signs reviewed. BP 128/76   Pulse 90   Temp 97.9 F (36.6 C) (Oral)   Ht 5\' 2"  (1.575 m)   Wt 108 lb (49 kg)   BMI 19.75 kg/m   Physical Examination:  General: Awake, alert, elderly female, No acute distress HEENT: Normal, sclera white Cardio: regular rate and rhythm, S1S2 heard, no murmurs appreciated Pulm: Globally decreased breath sounds.  Air movement fair.  No wheezes, rhonchi or rale.  Normal work of breathing on room air Extremities: warm, well perfused, No edema, cyanosis or clubbing; +2 pulses bilaterally  Assessment/ Plan: 73 y.o. female   1. Essential hypertension, benign Controlled.  Continue current regimen. - Basic Metabolic Panel  2. CKD (chronic kidney disease) stage 4, GFR 15-29 ml/min (HCC) We will check BMP as we do not have a creatinine on file since February.  Rest of renal function tests per nephrology - Basic Metabolic Panel  3. Mixed hyperlipidemia Check fasting lipid panel - Lipid Panel - rosuvastatin (CRESTOR) 10 MG tablet; Take 1 tablet (10 mg total) by mouth daily.  Dispense: 90 tablet; Refill: 3  4. Need for influenza vaccination Administered during today's visit   Orders Placed This Encounter  Procedures  . Lipid Panel  . Basic Metabolic Panel   No orders of the defined types were placed in this encounter.    Diane Norlander, DO [place] [phone]

## 2018-12-27 NOTE — Patient Instructions (Signed)

## 2019-01-11 DIAGNOSIS — E785 Hyperlipidemia, unspecified: Secondary | ICD-10-CM | POA: Diagnosis not present

## 2019-01-11 DIAGNOSIS — Z9079 Acquired absence of other genital organ(s): Secondary | ICD-10-CM | POA: Diagnosis not present

## 2019-01-11 DIAGNOSIS — Z90722 Acquired absence of ovaries, bilateral: Secondary | ICD-10-CM | POA: Diagnosis not present

## 2019-01-11 DIAGNOSIS — I10 Essential (primary) hypertension: Secondary | ICD-10-CM | POA: Diagnosis not present

## 2019-01-11 DIAGNOSIS — Z9889 Other specified postprocedural states: Secondary | ICD-10-CM | POA: Diagnosis not present

## 2019-01-11 DIAGNOSIS — Z90710 Acquired absence of both cervix and uterus: Secondary | ICD-10-CM | POA: Diagnosis not present

## 2019-01-11 DIAGNOSIS — D0512 Intraductal carcinoma in situ of left breast: Secondary | ICD-10-CM | POA: Diagnosis not present

## 2019-01-11 DIAGNOSIS — Z9049 Acquired absence of other specified parts of digestive tract: Secondary | ICD-10-CM | POA: Diagnosis not present

## 2019-01-12 ENCOUNTER — Ambulatory Visit: Payer: Self-pay | Admitting: Family Medicine

## 2019-01-15 IMAGING — CT CT CHEST W/O CM
2 of 3 series · 15 of 36 positions shown, 18 images · non-contrast
Comparison: Radiographs 02/21/2018 and 01/19/2018. Abdominal CT
01/20/2016.

CLINICAL DATA: Shortness of breath. Abnormal x-ray. Evaluate for
malignancy. History of recurrent pneumonia. No history of
malignancy.

EXAM:
CT CHEST WITHOUT CONTRAST
TECHNIQUE: Multidetector CT imaging of the chest was performed following the
standard protocol without IV contrast.

[Series 2: thorax · axial · 0.60mm/px · z∈[+988,+1248]mm · 12 of 154 slices shown, 15 images]
[im 12/154  mediastinal]
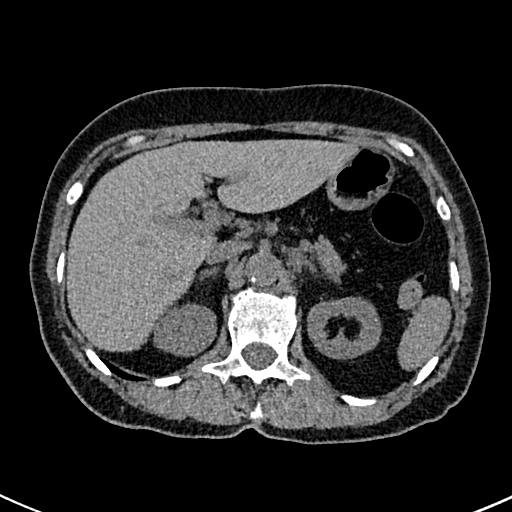
[im 12/154  lung]
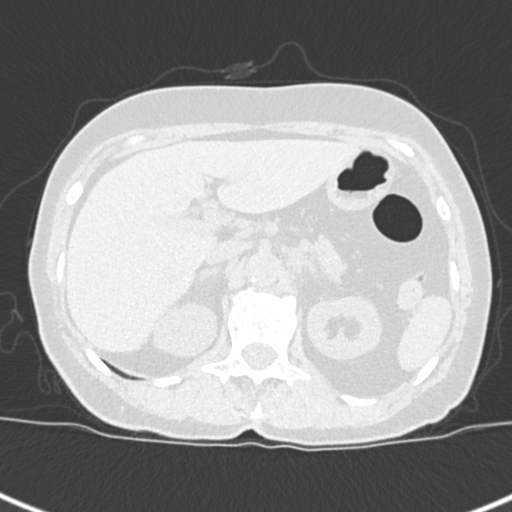
[im 23/154  lung]
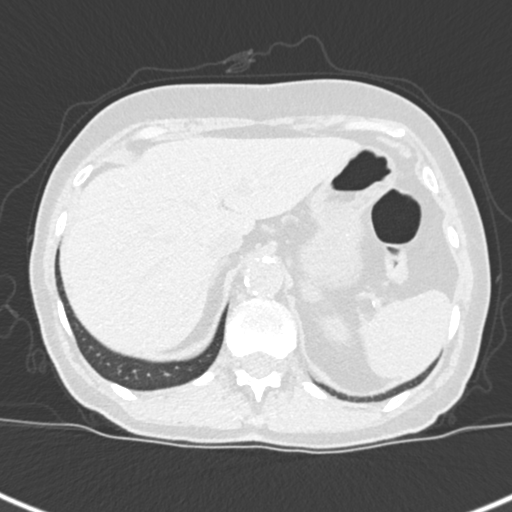
[im 35/154  lung]
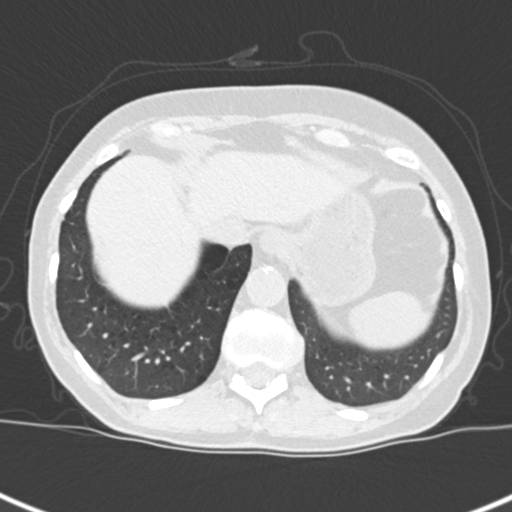
[im 46/154  lung]
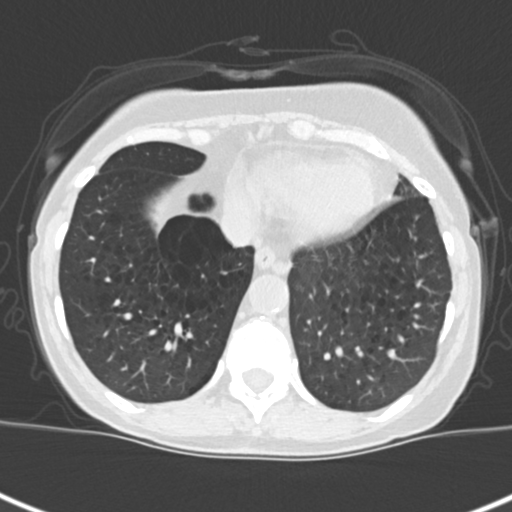
[im 57/154  mediastinal]
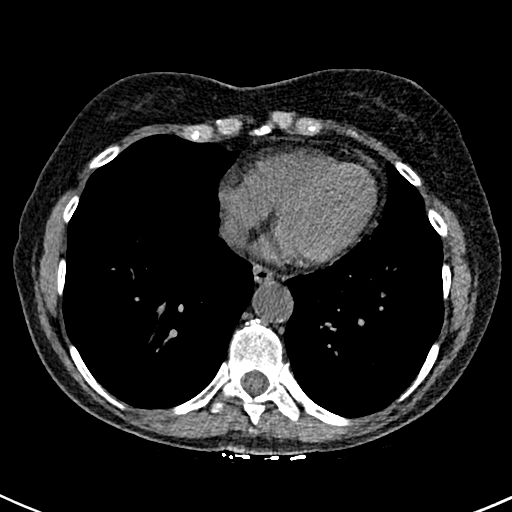
[im 57/154  lung]
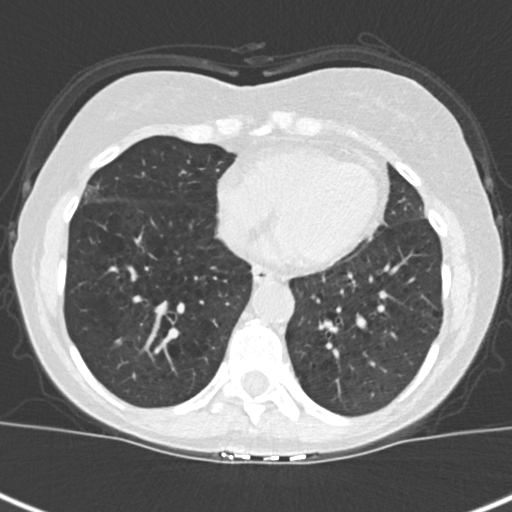
[im 69/154  lung]
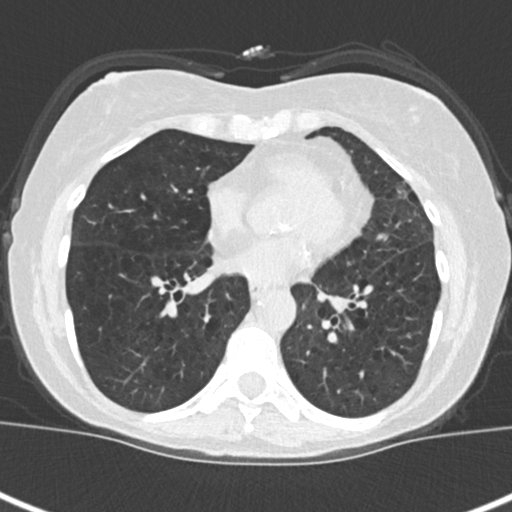
[im 86/154  lung]
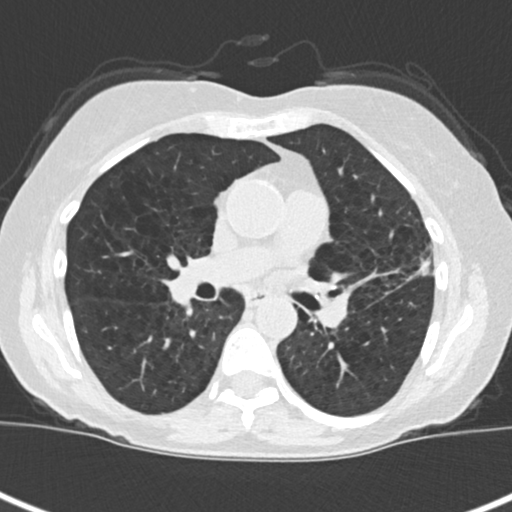
[im 97/154  lung]
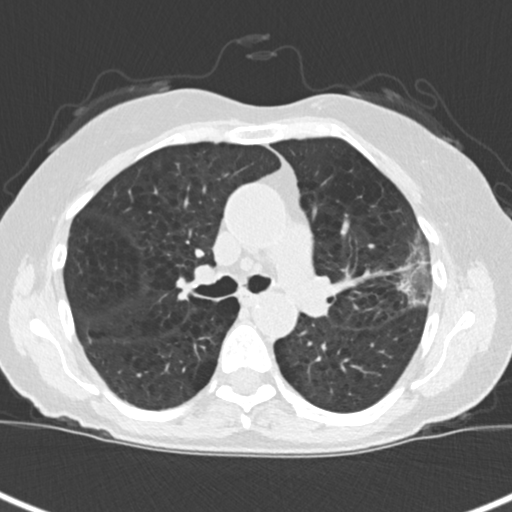
[im 108/154  mediastinal]
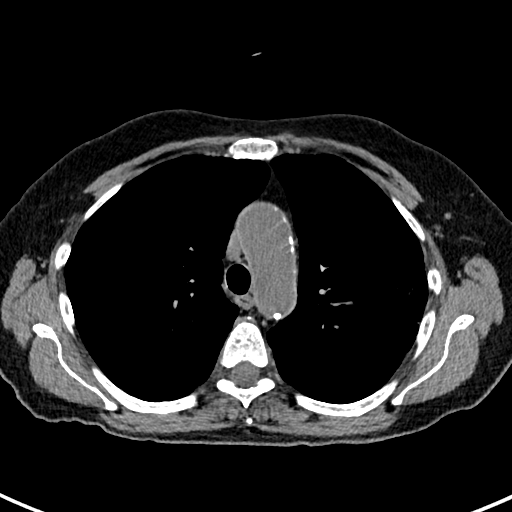
[im 108/154  lung]
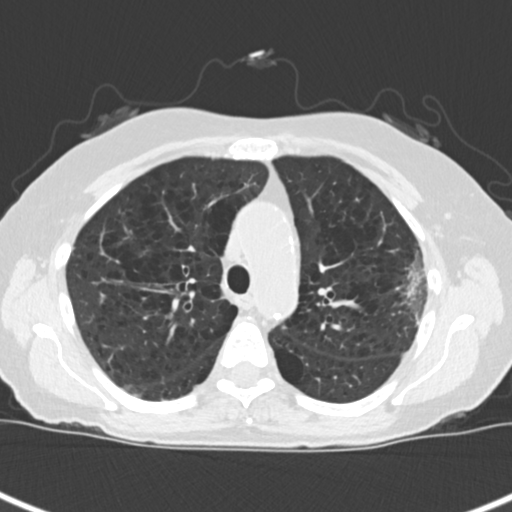
[im 120/154  lung]
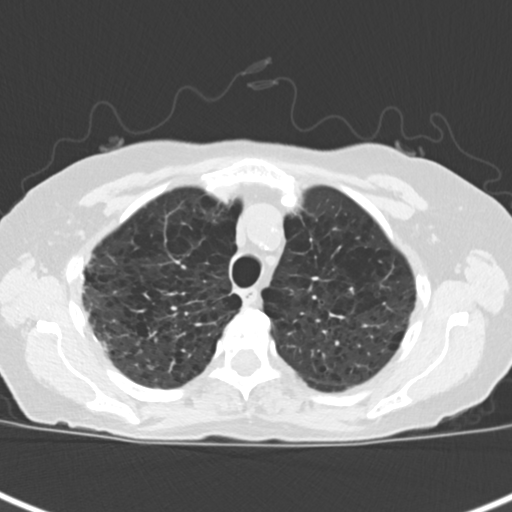
[im 131/154  lung]
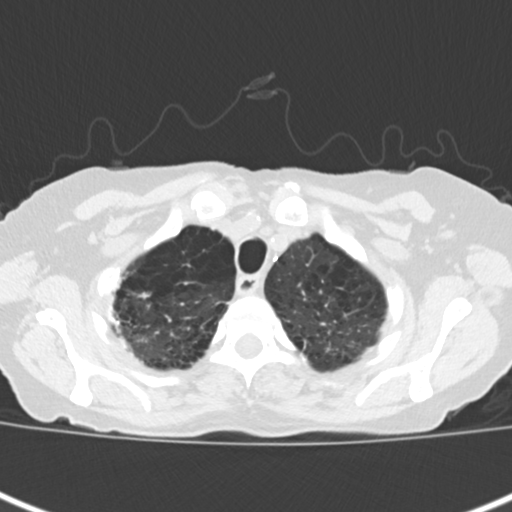
[im 142/154  lung]
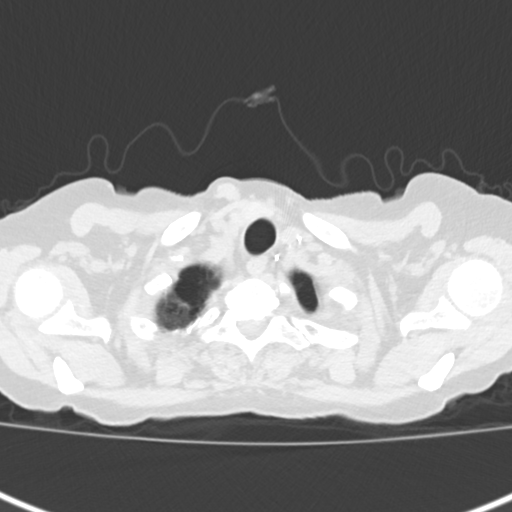

[Series 5: coronal · coronal · 0.60mm/px · 3 of 109 slices shown]
[im 22/109  lung]
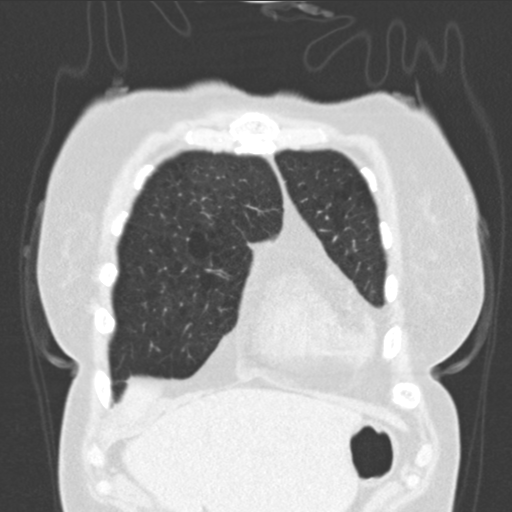
[im 44/109  lung]
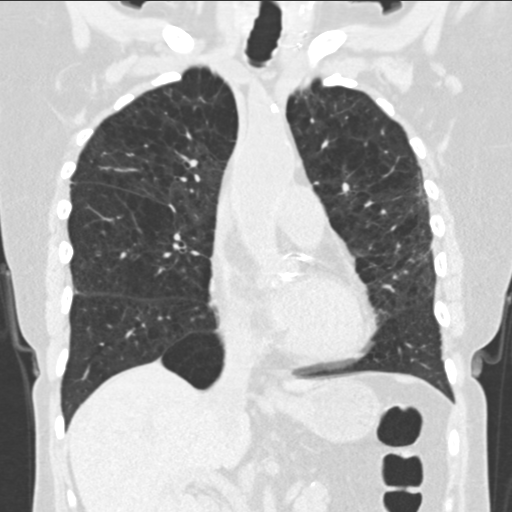
[im 65/109  lung]
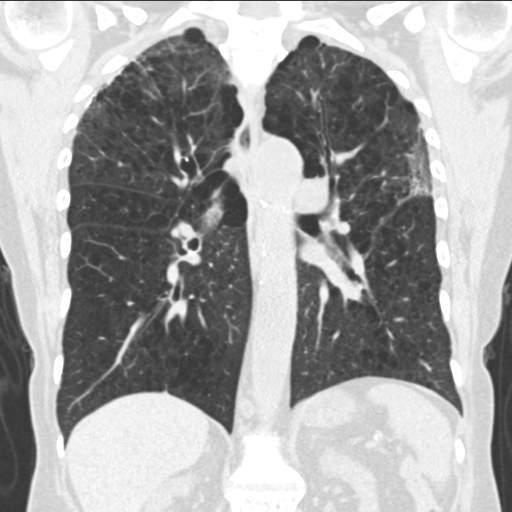

[15 of 36 positions shown; findings below may reference images not displayed]

FINDINGS: Cardiovascular: Fairly extensive coronary artery atherosclerosis
with slightly lesser involvement of the thoracic aorta and great
vessels. No acute vascular findings on noncontrast imaging. The
heart size is normal. There is no pericardial effusion.

Mediastinum/Nodes: There are no enlarged mediastinal, hilar or
axillary lymph nodes.There are scattered small mediastinal lymph
nodes. There is mild thyroid nodularity, measuring up to 12 mm in
the right isthmus (image [DATE]), unlikely to be clinically significant
and below threshold for additional imaging. There are surgical clips
along the left thoracic inlet. The trachea and esophagus appear
normal.

Lungs/Pleura: There is no pleural effusion or pneumothorax. There is
moderate to severe centrilobular emphysema with partially calcified
biapical subpleural scarring. There is persistent residual
subpleural opacity peripherally in the left upper lobe as seen on
recent radiographs. This is predominately ground-glass and
ill-defined, although there is a central solid component measuring
up to 8 x 7 mm on image 55/4. No other focal pulmonary nodules.

Upper abdomen: Mild nodularity of the left adrenal gland is stable
from the prior abdominal CT. The visualized upper abdomen is
otherwise unremarkable.

Musculoskeletal/Chest wall: There is no chest wall mass or
suspicious osseous finding.
IMPRESSION: 1. Nonspecific subpleural density peripherally in the left upper
lobe, at site of more consolidative airspace disease on radiographs
of 6 weeks ago, most likely resolving inflammation or
postinflammatory scarring. There is an associated small solid
component centrally. Recommend noncontrast chest CT in 3-6 months to
exclude atypical malignancy.
2. Moderate to severe centrilobular emphysema with calcified
biapical subpleural scarring. Emphysema ([id number]-ZI4.N).
3. Coronary and Aortic Atherosclerosis (F1L2D-ILH.H).

## 2019-03-07 ENCOUNTER — Encounter: Payer: Self-pay | Admitting: *Deleted

## 2019-04-06 ENCOUNTER — Ambulatory Visit
Admission: RE | Admit: 2019-04-06 | Discharge: 2019-04-06 | Disposition: A | Discharge status: Home / Self Care | Payer: Medicare PPO | Source: Ambulatory Visit | Attending: Adult Health | Admitting: Adult Health

## 2019-04-06 ENCOUNTER — Other Ambulatory Visit: Payer: Self-pay

## 2019-04-06 DIAGNOSIS — C50212 Malignant neoplasm of upper-inner quadrant of left female breast: Secondary | ICD-10-CM

## 2019-04-06 DIAGNOSIS — Z170 Estrogen receptor positive status [ER+]: Secondary | ICD-10-CM

## 2019-04-06 DIAGNOSIS — R928 Other abnormal and inconclusive findings on diagnostic imaging of breast: Secondary | ICD-10-CM | POA: Diagnosis not present

## 2019-05-23 NOTE — Progress Notes (Signed)
Patient Care Team: [name], DO as PCP - General (Family Medicine) Rexene Agent, MD as Attending Physician (Nephrology) Nicholas Lose, MD as Consulting Physician (Hematology and Oncology) Fanny Skates, MD as Consulting Physician (General Surgery)  DIAGNOSIS:    ICD-10-CM   1. 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 with above-mentioned history of left breast cancer for which she underwent a lumpectomy and is currently on antiestrogen therapy with anastrozole. Mammogram on 04/06/19 showed no evidence of malignancy bilaterally. She presents to the clinic today for follow-up.  She has intermittent headaches but otherwise doing quite well.  Denies any hot flashes or arthralgias or myalgias.  She got her first Covid vaccine couple weeks ago.  She did very well.  ALLERGIES:  is allergic to pravastatin; zetia [ezetimibe]; cephalexin; and sulfa antibiotics.  MEDICATIONS:  Current Outpatient Medications  Medication Sig Dispense Refill  . amLODipine (NORVASC) 10 MG tablet Take 1 tablet (10 mg total) by mouth daily. 90 tablet 3  . anastrozole (ARIMIDEX) 1 MG tablet Take 1 tablet (1 mg total) by mouth daily. 90 tablet 3  .  aspirin 81 MG tablet Take 81 mg by mouth daily.      . cholecalciferol (VITAMIN D) 1000 UNITS tablet Take 1,000 Units by mouth daily.    [name] desloratadine (CLARINEX) 5 MG tablet Take 1 tablet (5 mg total) by mouth daily. 30 tablet 3  . fluticasone (FLONASE) 50 MCG/ACT nasal spray Place 2 sprays into both nostrils daily. 16 g 6  . Garlic 1779 MG CAPS Take 1,000 mg by mouth 2 (two) times daily.     [name] levocetirizine (XYZAL) 5 MG tablet TAKE 1 TABLET EVERY EVENING 90 tablet 3  . rosuvastatin (CRESTOR) 10 MG tablet Take 1 tablet (10 mg total) by mouth daily. 90 tablet 3   No current facility-administered medications for this visit.    PHYSICAL EXAMINATION: ECOG PERFORMANCE STATUS: 1 - Symptomatic but completely ambulatory  Vitals:   05/24/19 0854  BP: 136/74  Pulse: 98  Resp: 17  Temp: 98.5 F (36.9 C)  SpO2: 98%   Filed Weights   05/24/19 0854  Weight: 107 lb 9.6 oz (48.8 kg)    BREAST: No palpable masses or nodules in either right or left breasts. No palpable axillary supraclavicular or infraclavicular adenopathy no breast tenderness or nipple discharge. (exam performed in the presence of a chaperone)  LABORATORY DATA:  I have reviewed the data as listed CMP Latest Ref Rng & Units 12/27/2018 05/23/2018 03/07/2018  Glucose 65 - 99 mg/dL 99 104(H) 126(H)  BUN 8 - 27 mg/dL 28(H) 20 19  Creatinine 0.57 - 1.00 mg/dL 2.42(H) 2.07(H) 2.06(H)  Sodium 134 -  144 mmol/L 142 139 141  Potassium 3.5 - 5.2 mmol/L 4.9 4.8 4.7  Chloride 96 - 106 mmol/L 104 111 103  CO2 20 - 29 mmol/L 22 20(L) 17(L)  Calcium 8.7 - 10.3 mg/dL 9.9 9.5 9.2  Total Protein 6.5 - 8.1 g/dL - 7.5 -  Total Bilirubin 0.3 - 1.2 mg/dL - 0.4 -  Alkaline Phos 38 - 126 U/L - 214(H) -  AST 15 - 41 U/L - 20 -  ALT 0 - 44 U/L - 15 -    Lab Results  Component Value Date   WBC 8.4 05/23/2018   HGB 11.5 (L) 05/23/2018   HCT 36.6 05/23/2018   MCV 91.5 05/23/2018   PLT 307 05/23/2018   NEUTROABS 5.9 05/23/2018    ASSESSMENT  & PLAN:  Carcinoma of upper-inner quadrant of left breast in female, estrogen receptor positive ([place]) 05/31/2018:Left lumpectomy: 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 Current treatment: Adjuvant anastrozole 1 mg daily started 06/2018  Anastrozole Toxicities: Occasional headaches but otherwise tolerating it very well  Breast cancer surveillance: 1.  Breast exam 05/24/2019: Benign 2.  Mammogram 04/06/2019: Benign breast density category B Patient received first dose of COVID-19 vaccine.  Return to clinic in 1 year for follow-up   No orders of the defined types were placed in this encounter.  The patient has a good understanding of the overall plan. she agrees with it. she will call with any problems that may develop before the next visit here.  Total time spent: 20 mins including face to face time and time spent for planning, charting and coordination of care  Nicholas Lose, MD 05/24/2019  I, Cloyde Reams Dorshimer, am acting as scribe for Dr. Nicholas Lose.  I have reviewed the above documentation for accuracy and completeness, and I agree with the above.

## 2019-05-24 ENCOUNTER — Inpatient Hospital Stay: Payer: Medicare PPO | Attending: Hematology and Oncology | Admitting: Hematology and Oncology

## 2019-05-24 ENCOUNTER — Other Ambulatory Visit: Payer: Self-pay

## 2019-05-24 DIAGNOSIS — Z7982 Long term (current) use of aspirin: Secondary | ICD-10-CM | POA: Insufficient documentation

## 2019-05-24 DIAGNOSIS — Z79811 Long term (current) use of aromatase inhibitors: Secondary | ICD-10-CM | POA: Diagnosis not present

## 2019-05-24 DIAGNOSIS — C50212 Malignant neoplasm of upper-inner quadrant of left female breast: Secondary | ICD-10-CM | POA: Diagnosis not present

## 2019-05-24 DIAGNOSIS — Z170 Estrogen receptor positive status [ER+]: Secondary | ICD-10-CM | POA: Diagnosis not present

## 2019-05-24 DIAGNOSIS — Z79899 Other long term (current) drug therapy: Secondary | ICD-10-CM | POA: Insufficient documentation

## 2019-05-24 DIAGNOSIS — R519 Headache, unspecified: Secondary | ICD-10-CM | POA: Insufficient documentation

## 2019-05-24 MED ORDER — ANASTROZOLE 1 MG PO TABS: 1.0000 mg | ORAL_TABLET | Freq: Every day | ORAL | 3 refills | Status: DC

## 2019-05-24 NOTE — Assessment & Plan Note (Signed)
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 Current treatment: Adjuvant anastrozole 1 mg daily started 06/2018  Anastrozole Toxicities:  Breast cancer surveillance: 1.  Breast exam 05/24/2019: Benign 2.  Mammogram 04/06/2019: Benign breast density category B  Return to clinic in 1 year for follow-up

## 2019-06-07 DIAGNOSIS — Z961 Presence of intraocular lens: Secondary | ICD-10-CM | POA: Diagnosis not present

## 2019-06-07 DIAGNOSIS — H1045 Other chronic allergic conjunctivitis: Secondary | ICD-10-CM | POA: Diagnosis not present

## 2019-06-07 DIAGNOSIS — H31001 Unspecified chorioretinal scars, right eye: Secondary | ICD-10-CM | POA: Diagnosis not present

## 2019-06-07 DIAGNOSIS — H35411 Lattice degeneration of retina, right eye: Secondary | ICD-10-CM | POA: Diagnosis not present

## 2019-06-07 DIAGNOSIS — H04123 Dry eye syndrome of bilateral lacrimal glands: Secondary | ICD-10-CM | POA: Diagnosis not present

## 2019-06-07 DIAGNOSIS — D492 Neoplasm of unspecified behavior of bone, soft tissue, and skin: Secondary | ICD-10-CM | POA: Diagnosis not present

## 2019-06-28 ENCOUNTER — Other Ambulatory Visit: Payer: Self-pay

## 2019-06-28 ENCOUNTER — Encounter: Payer: Self-pay | Admitting: Family Medicine

## 2019-06-28 ENCOUNTER — Telehealth: Payer: Self-pay | Admitting: Family Medicine

## 2019-06-28 ENCOUNTER — Ambulatory Visit: Payer: Medicare PPO | Admitting: Family Medicine

## 2019-06-28 VITALS — BP 117/66 | HR 85 | Temp 98.4°F | Ht 62.0 in | Wt 107.0 lb

## 2019-06-28 DIAGNOSIS — N184 Chronic kidney disease, stage 4 (severe): Secondary | ICD-10-CM | POA: Diagnosis not present

## 2019-06-28 DIAGNOSIS — I10 Essential (primary) hypertension: Secondary | ICD-10-CM | POA: Diagnosis not present

## 2019-06-28 NOTE — Telephone Encounter (Signed)
Updated patients chart °

## 2019-06-28 NOTE — Progress Notes (Signed)
Subjective: CC: 31 m f/u HTN w/ CKD4 PCP: [name], DO ZHG:DJME Diane Carlson is a 74 y.o. female presenting to clinic today for:  1. Hypertension with  CKD4 Patient reports compliance with Norvasc 10 mg and vitamin D daily.  No chest pain, shortness of breath (outside of her normal), edema.  Urine output is normal.  She is tolerating exercise without difficulty and in fact has a class at the gym today.  She has follow-up with Dr. Carmina Miller in May.  She would like to go and get his labs today.  ROS: Per HPI  Allergies  Allergen Reactions  . Pravastatin     Muscle aches  . Zetia [Ezetimibe]     mylagias  . Cephalexin Rash    Inside mouth  . Sulfa Antibiotics Rash    Including mouth   Past Medical History:  Diagnosis Date  . Allergy   . Atypical pneumonia 09/25/2017  . Cancer ([place])    breast cancer; dx in 04/2018.   [name] Cholesteatoma   . Complication of anesthesia    "slow to wake up, couldn't catch my breath"- has had anesthesia since this occurence and did fine.   . 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 Essential hypertension, benign   . FH: TAH-BSO (total abdominal hysterectomy and bilateral salpingo-oophorectomy)   . Gout   . Hx of appendectomy   . Hyperlipidemia   . Kidney disease   . Osteopenia   . Paget's disease   . Pneumonia   . Stroke ([place])    TIA; resulted in having a carotid endartectomy.   . Vitamin D deficiency     Current Outpatient Medications:  .  amLODipine (NORVASC) 10 MG tablet, Take 1 tablet (10 mg total) by mouth daily., Disp: 90 tablet, Rfl: 3 .  anastrozole (ARIMIDEX) 1 MG tablet, Take 1 tablet (1 mg total) by mouth daily., Disp: 90 tablet, Rfl: 3 .  aspirin 81 MG tablet, Take 81 mg by mouth daily.  , Disp: , Rfl:  .  cholecalciferol (VITAMIN D) 1000 UNITS tablet, Take 1,000 Units by mouth daily., Disp: , Rfl:  .  fluticasone (FLONASE) 50 MCG/ACT nasal spray, Place 2 sprays into both nostrils daily., Disp: 16 g,  Rfl: 6 .  Garlic 2683 MG CAPS, Take 1,000 mg by mouth 2 (two) times daily. , Disp: , Rfl:  .  levocetirizine (XYZAL) 5 MG tablet, TAKE 1 TABLET EVERY EVENING, Disp: 90 tablet, Rfl: 3 .  rosuvastatin (CRESTOR) 10 MG tablet, Take 1 tablet (10 mg total) by mouth daily., Disp: 90 tablet, Rfl: 3 .  desloratadine (CLARINEX) 5 MG tablet, Take 1 tablet (5 mg total) by mouth daily., Disp: 30 tablet, Rfl: 3 Social History   Socioeconomic History  . Marital status: Widowed    Spouse name: Not on file  . Number of children: 2  . Years of education: cosemtology certificate  . Highest education level: Some college, no degree  Occupational History  . 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 Smoking status: Former Smoker    Packs/day: 1.00    Years: 20.00    Pack years: 20.00    Types: Cigarettes    Quit date: 08/25/2009    Years since quitting: 9.8  . Smokeless tobacco: Never Used  Substance and Sexual Activity  . Alcohol use: No  . Drug use: No  . Sexual activity: Not Currently  Other Topics Concern  .  Not on file  Social History Narrative   Lives alone   She exercises everyday and enjoys zumba   Social Determinants of Health   Financial Resource Strain:   . Difficulty of Paying Living Expenses:   Food Insecurity:   . Worried About Charity fundraiser in the Last Year:   . Arboriculturist in the Last Year:   Transportation Needs:   . Film/video editor (Medical):   [name] Lack of Transportation (Non-Medical):   Physical Activity:   . Days of Exercise per Week:   . Minutes of Exercise per Session:   Stress:   . Feeling of Stress :   Social Connections:   . Frequency of Communication with Friends and Family:   . Frequency of Social Gatherings with Friends and Family:   . Attends Religious Services:   . Active Member of Clubs or Organizations:   . Attends Archivist Meetings:   [name] Marital Status:   Intimate Partner Violence:   .  Fear of Current or Ex-Partner:   . Emotionally Abused:   [name] Physically Abused:   . Sexually Abused:    Family History  Problem Relation Age of Onset  . Heart disease Mother   . Stroke Mother 69  . Heart disease Father   . Heart attack Father   . Hypertension Son   . Heart attack Paternal Aunt   . Heart disease Paternal Aunt     Objective: Office vital signs reviewed. BP 117/66   Pulse 85   Temp 98.4 F (36.9 C) (Temporal)   Ht 5' 2"  (1.575 m)   Wt 107 lb (48.5 kg)   SpO2 100%   BMI 19.57 kg/m   Physical Examination:  General: Awake, alert, elderly female, well appearing. No acute distress HEENT: Normal, sclera white Cardio: regular rate and rhythm, S1S2 heard, no murmurs appreciated Pulm: Globally decreased breath sounds. Prolonged expiratory phase. Air movement fair.  No wheezes, rhonchi or rale.  Normal work of breathing on room air Extremities: warm, well perfused, No edema, cyanosis or clubbing; +2 pulses bilaterally  Assessment/ Plan: 74 y.o. female   1. CKD (chronic kidney disease) stage 4, GFR 15-29 ml/min (HCC) We will obtain labs in preparation for her appointment with Dr. Joelyn Oms. - VITAMIN D 25 Hydroxy (Vit-D Deficiency, Fractures) - CBC - Renal Function Panel - Ferritin  2. Essential hypertension, benign Stable.  Continue current regimen   Orders Placed This Encounter  Procedures  . VITAMIN D 25 Hydroxy (Vit-D Deficiency, Fractures)  . CBC  . CMP14+EGFR   No orders of the defined types were placed in this encounter.    [name], DO [facility] [phone]

## 2019-06-28 NOTE — Telephone Encounter (Signed)
Called and left patient message to see which vaccine she had to update her medical record.

## 2019-06-29 DIAGNOSIS — D0512 Intraductal carcinoma in situ of left breast: Secondary | ICD-10-CM | POA: Diagnosis not present

## 2019-06-29 DIAGNOSIS — Z9889 Other specified postprocedural states: Secondary | ICD-10-CM | POA: Diagnosis not present

## 2019-06-29 DIAGNOSIS — Z9049 Acquired absence of other specified parts of digestive tract: Secondary | ICD-10-CM | POA: Diagnosis not present

## 2019-06-29 DIAGNOSIS — Z90722 Acquired absence of ovaries, bilateral: Secondary | ICD-10-CM | POA: Diagnosis not present

## 2019-06-29 DIAGNOSIS — Z9079 Acquired absence of other genital organ(s): Secondary | ICD-10-CM | POA: Diagnosis not present

## 2019-06-29 DIAGNOSIS — I10 Essential (primary) hypertension: Secondary | ICD-10-CM | POA: Diagnosis not present

## 2019-06-29 DIAGNOSIS — E785 Hyperlipidemia, unspecified: Secondary | ICD-10-CM | POA: Diagnosis not present

## 2019-06-29 DIAGNOSIS — Z90710 Acquired absence of both cervix and uterus: Secondary | ICD-10-CM | POA: Diagnosis not present

## 2019-06-29 LAB — RENAL FUNCTION PANEL
Albumin: 4.6 g/dL (ref 3.7–4.7)
BUN/Creatinine Ratio: 10 — ABNORMAL LOW (ref 12–28)
BUN: 23 mg/dL (ref 8–27)
CO2: 21 mmol/L (ref 20–29)
Calcium: 9.9 mg/dL (ref 8.7–10.3)
Chloride: 108 mmol/L — ABNORMAL HIGH (ref 96–106)
Creatinine, Ser: 2.36 mg/dL — ABNORMAL HIGH (ref 0.57–1.00)
GFR calc Af Amer: 23 mL/min/{1.73_m2} — ABNORMAL LOW (ref >59)
GFR calc non Af Amer: 20 mL/min/{1.73_m2} — ABNORMAL LOW (ref >59)
Glucose: 100 mg/dL — ABNORMAL HIGH (ref 65–99)
Phosphorus: 4.1 mg/dL (ref 3.0–4.3)
Potassium: 4.7 mmol/L (ref 3.5–5.2)
Sodium: 143 mmol/L (ref 134–144)

## 2019-06-29 LAB — CBC
Hematocrit: 35 % (ref 34.0–46.6)
Hemoglobin: 11.6 g/dL (ref 11.1–15.9)
MCH: 30.5 pg (ref 26.6–33.0)
MCHC: 33.1 g/dL (ref 31.5–35.7)
MCV: 92 fL (ref 79–97)
Platelets: 257 10*3/uL (ref 150–450)
RBC: 3.8 x10E6/uL (ref 3.77–5.28)
RDW: 13.4 % (ref 11.7–15.4)
WBC: 8.3 10*3/uL (ref 3.4–10.8)

## 2019-06-29 LAB — FERRITIN: Ferritin: 61 ng/mL (ref 15–150)

## 2019-06-29 LAB — VITAMIN D 25 HYDROXY (VIT D DEFICIENCY, FRACTURES): Vit D, 25-Hydroxy: 42.4 ng/mL (ref 30.0–100.0)

## 2019-07-31 ENCOUNTER — Other Ambulatory Visit: Payer: Self-pay

## 2019-07-31 ENCOUNTER — Other Ambulatory Visit: Payer: Medicare PPO

## 2019-07-31 DIAGNOSIS — N184 Chronic kidney disease, stage 4 (severe): Secondary | ICD-10-CM | POA: Diagnosis not present

## 2019-08-10 DIAGNOSIS — I129 Hypertensive chronic kidney disease with stage 1 through stage 4 chronic kidney disease, or unspecified chronic kidney disease: Secondary | ICD-10-CM | POA: Diagnosis not present

## 2019-08-10 DIAGNOSIS — D631 Anemia in chronic kidney disease: Secondary | ICD-10-CM | POA: Diagnosis not present

## 2019-08-10 DIAGNOSIS — N184 Chronic kidney disease, stage 4 (severe): Secondary | ICD-10-CM | POA: Diagnosis not present

## 2019-08-10 DIAGNOSIS — N2581 Secondary hyperparathyroidism of renal origin: Secondary | ICD-10-CM | POA: Diagnosis not present

## 2019-08-30 DIAGNOSIS — N184 Chronic kidney disease, stage 4 (severe): Secondary | ICD-10-CM | POA: Diagnosis not present

## 2019-10-16 ENCOUNTER — Other Ambulatory Visit: Payer: Self-pay | Admitting: Family Medicine

## 2019-10-16 DIAGNOSIS — I10 Essential (primary) hypertension: Secondary | ICD-10-CM

## 2019-11-14 ENCOUNTER — Telehealth: Payer: Self-pay | Admitting: Family Medicine

## 2019-11-14 ENCOUNTER — Other Ambulatory Visit: Payer: Self-pay | Admitting: *Deleted

## 2019-11-14 NOTE — Telephone Encounter (Signed)
°  Prescription Request  11/14/2019  What is the name of the medication or equipment? Inhaler-Albuterol  Have you contacted your pharmacy to request a refill? (if applicable) No  Which pharmacy would you like this sent to? [facility]   Patient notified that their request is being sent to the clinical staff for review and that they should receive a response within 2 business days.   [facility] pt.  Pt is positive for COVID from Home Test.

## 2019-11-15 MED ORDER — ALBUTEROL SULFATE HFA 108 (90 BASE) MCG/ACT IN AERS: 2.0000 | INHALATION_SPRAY | Freq: Four times a day (QID) | RESPIRATORY_TRACT | 2 refills | Status: DC | PRN

## 2019-11-15 NOTE — Telephone Encounter (Signed)
Tabitha-grandaughter is aware that albuterol is not on current med list but is needing a refill because pt is positive for COVID. She tested positive on Monday also pt has COPD. Use [employer]

## 2019-11-15 NOTE — Telephone Encounter (Signed)
Daughter aware.

## 2019-11-15 NOTE — Telephone Encounter (Signed)
done

## 2019-11-17 ENCOUNTER — Telehealth: Payer: Self-pay | Admitting: *Deleted

## 2019-11-17 NOTE — Telephone Encounter (Signed)
Pt has sinus pressure and a stuffy nose. She tested positive Monday for covid, but believes it is false. There are no appts available until Tuesday.She has called multiple times, Dr. Darnell Level pt. Can she have antibiotic. Sorry

## 2019-11-17 NOTE — Telephone Encounter (Signed)
Pt aware of provider feedback and voiced understanding. 

## 2019-11-17 NOTE — Telephone Encounter (Signed)
Have her use Flonase and Mucinex and nasal saline washes and if no better call us Monday, if worsens let us know

## 2019-11-20 ENCOUNTER — Other Ambulatory Visit: Payer: Self-pay | Admitting: Family Medicine

## 2019-11-20 DIAGNOSIS — I10 Essential (primary) hypertension: Secondary | ICD-10-CM

## 2019-11-24 ENCOUNTER — Ambulatory Visit (INDEPENDENT_AMBULATORY_CARE_PROVIDER_SITE_OTHER): Payer: Medicare PPO | Admitting: Family Medicine

## 2019-11-24 ENCOUNTER — Encounter: Payer: Self-pay | Admitting: Family Medicine

## 2019-11-24 DIAGNOSIS — J019 Acute sinusitis, unspecified: Secondary | ICD-10-CM

## 2019-11-24 MED ORDER — AMOXICILLIN-POT CLAVULANATE 875-125 MG PO TABS: 1.0000 | ORAL_TABLET | Freq: Two times a day (BID) | ORAL | 0 refills | Status: AC

## 2019-11-24 MED ORDER — PREDNISONE 10 MG (21) PO TBPK: ORAL_TABLET | ORAL | 0 refills | Status: DC

## 2019-11-24 NOTE — Progress Notes (Signed)
Virtual Visit via Telephone Note  I connected with Diane Carlson on 11/24/19 at 4:35 PM by telephone and verified that I am speaking with the correct person using two identifiers. Diane Carlson is currently located at home and her granddaughter is currently with her during this visit. The provider, Loman Brooklyn, FNP is located in their office at time of visit.  I discussed the limitations, risks, security and privacy concerns of performing an evaluation and management service by telephone and the availability of in person appointments. I also discussed with the patient that there may be a patient responsible charge related to this service. The patient expressed understanding and agreed to proceed.  Subjective: PCP: [name], DO  Chief Complaint  Patient presents with   URI   Patient complains of cough, head congestion, headache, facial pain/pressure, postnasal drainage and loss of taste. Onset of symptoms was over a week ago, unchanged since that time. She is drinking plenty of fluids and urinating per her usual. Evaluation to date: patient reports her granddaughter works at a nursing home and performed a rapid COVID-19 test on her last Monday which was positive, but when she repeated it this week it was negative. Treatment to date: Mucinex. She has a history of COPD. She does not smoke.    ROS: Per HPI  Current Outpatient Medications:    albuterol (VENTOLIN HFA) 108 (90 Base) MCG/ACT inhaler, Inhale 2 puffs into the lungs every 6 (six) hours as needed for wheezing or shortness of breath., Disp: 8 g, Rfl: 2   amLODipine (NORVASC) 10 MG tablet, TAKE 1 TABLET EVERY DAY, Disp: 90 tablet, Rfl: 0   anastrozole (ARIMIDEX) 1 MG tablet, Take 1 tablet (1 mg total) by mouth daily., Disp: 90 tablet, Rfl: 3   aspirin 81 MG tablet, Take 81 mg by mouth daily.  , Disp: , Rfl:    cholecalciferol (VITAMIN D) 1000 UNITS tablet, Take 1,000 Units by mouth daily., Disp: , Rfl:     desloratadine (CLARINEX) 5 MG tablet, Take 1 tablet (5 mg total) by mouth daily., Disp: 30 tablet, Rfl: 3   fluticasone (FLONASE) 50 MCG/ACT nasal spray, Place 2 sprays into both nostrils daily., Disp: 16 g, Rfl: 6   Garlic 3295 MG CAPS, Take 1,000 mg by mouth 2 (two) times daily. , Disp: , Rfl:    levocetirizine (XYZAL) 5 MG tablet, TAKE 1 TABLET EVERY EVENING, Disp: 90 tablet, Rfl: 3   rosuvastatin (CRESTOR) 10 MG tablet, Take 1 tablet (10 mg total) by mouth daily., Disp: 90 tablet, Rfl: 3  Allergies  Allergen Reactions   Pravastatin     Muscle aches   Zetia [Ezetimibe]     mylagias   Cephalexin Rash    Inside mouth   Sulfa Antibiotics Rash    Including mouth   Past Medical History:  Diagnosis Date   Allergy    Atypical pneumonia 09/25/2017   Cancer ([place])    breast cancer; dx in 04/2018.    Cholesteatoma    Complication of anesthesia    "slow to wake up, couldn't catch my breath"- has had anesthesia since this occurence and did fine.    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    Vitamin D deficiency     Observations/Objective: A&O  No respiratory distress or wheezing audible over the phone Mood, judgement, and thought processes all WNL  Assessment and Plan: 1. Acute non-recurrent sinusitis, unspecified location - Discussed symptom management.  - amoxicillin-clavulanate (AUGMENTIN) 875-125 MG tablet; Take 1 tablet by mouth 2 (two) times daily for 7 days.  Dispense: 14 tablet; Refill: 0 - predniSONE (STERAPRED UNI-PAK 21 TAB) 10 MG (21) TBPK tablet; As directed x 6 days  Dispense: 21 tablet; Refill: 0   Follow Up Instructions:  I  discussed the assessment and treatment plan with the patient. The patient was provided an opportunity to ask questions and all were answered. The patient agreed with the plan and demonstrated an understanding of the instructions.   The patient was advised to call back or seek an in-person evaluation if the symptoms worsen or if the condition fails to improve as anticipated.  The above assessment and management plan was discussed with the patient. The patient verbalized understanding of and has agreed to the management plan. Patient is aware to call the clinic if symptoms persist or worsen. Patient is aware when to return to the clinic for a follow-up visit. Patient educated on when it is appropriate to go to the emergency department.   Time call ended: 4:46 PM  I provided 13 minutes of non-face-to-face time during this encounter.  Hendricks Limes, MSN, APRN, FNP-C [facility] 11/24/19

## 2019-11-29 ENCOUNTER — Telehealth: Payer: Self-pay | Admitting: Family Medicine

## 2019-11-29 NOTE — Telephone Encounter (Signed)
Patient aware she will need to wait 90 days after positive covid test to receive the COVID Vaccine

## 2019-12-22 ENCOUNTER — Ambulatory Visit (INDEPENDENT_AMBULATORY_CARE_PROVIDER_SITE_OTHER): Payer: Medicare PPO

## 2019-12-22 DIAGNOSIS — Z0000 Encounter for general adult medical examination without abnormal findings: Secondary | ICD-10-CM

## 2019-12-22 NOTE — Patient Instructions (Signed)
  [facility] Maintenance Summary and Written Plan of Care  [name] ,  Thank you for allowing me to perform your Medicare Annual Wellness Visit and for your ongoing commitment to your health.   Health Maintenance & Immunization History Health Maintenance  Topic Date Due  . DEXA SCAN  09/09/2018  . INFLUENZA VACCINE  10/29/2019  . MAMMOGRAM  04/05/2020  . TETANUS/TDAP  09/03/2020  . COLONOSCOPY  02/26/2027  . COVID-19 Vaccine  Completed  . Hepatitis C Screening  Completed  . PNA vac Low Risk Adult  Completed   Immunization History  Administered Date(s) Administered  . Fluad Quad(high Dose 65+) 12/27/2018  . Influenza Whole 12/28/2009  . Influenza, High Dose Seasonal PF 03/02/2017  . Influenza,inj,Quad PF,6+ Mos 01/15/2014, 01/11/2015, 01/07/2016, 02/21/2018  . Moderna SARS-COVID-2 Vaccination 05/02/2019, 05/30/2019  . Pneumococcal Conjugate-13 08/31/2014  . Pneumococcal Polysaccharide-23 08/25/2012  . Tdap 09/04/2010  . Zoster 03/10/2013    These are the patient goals that we discussed: Goals Addressed            This Visit's Progress   . AWV   On track    12/09/2018 AWV Goal: Fall Prevention  . Over the next year, patient will decrease their risk for falls by: o Using assistive devices, such as a cane or walker, as needed o Identifying fall risks within their home and correcting them by: - Removing throw rugs - Adding handrails to stairs or ramps - Removing clutter and keeping a clear pathway throughout the home - Increasing light, especially at night - Adding shower handles/bars - Raising toilet seat o Identifying potential personal risk factors for falls: - Medication side effects - Incontinence/urgency - Vestibular dysfunction - Hearing loss - Musculoskeletal disorders - Neurological disorders - Orthostatic hypotension      . Decrease the likelihood of falling   On track    Remove rugs and small obstacles such as baskets  etc. from walkways.        This is a list of Health Maintenance Items that are overdue or due now: Health Maintenance Due  Topic Date Due  . DEXA SCAN  09/09/2018  . INFLUENZA VACCINE  10/29/2019     Orders/Referrals Placed Today: No orders of the defined types were placed in this encounter.  (Contact our referral department at [phone] if you have not spoken with someone about your referral appointment within the next 5 days)    Follow-up Plan  Scheduled with Dr. Lajuana Ripple 12/29/2019 at 8:00am.

## 2019-12-22 NOTE — Progress Notes (Signed)
MEDICARE ANNUAL WELLNESS VISIT  12/22/2019  Telephone Visit Disclaimer This Medicare AWV was conducted by telephone due to national recommendations for restrictions regarding the COVID-19 Pandemic (e.g. social distancing).  I verified, using two identifiers, that I am speaking with Diane Carlson or their authorized healthcare agent. I discussed the limitations, risks, security, and privacy concerns of performing an evaluation and management service by telephone and the potential availability of an in-person appointment in the future. The patient expressed understanding and agreed to proceed.  Location of Patient: Home Location of Provider (nurse):  [facility]  Subjective:    Diane Carlson is a 74 y.o. female patient of [name], DO who had a Medicare Annual Wellness Visit today via telephone. Diane Carlson is Retired and lives alone. she has two children. she reports that she is socially active and does interact with friends/family regularly. she is moderately physically active and enjoys walking and going to the gym.  Patient Care Team: [name], DO as PCP - General (Family Medicine) Rexene Agent, MD as Attending Physician (Nephrology) Nicholas Lose, MD as Consulting Physician (Hematology and Oncology) Fanny Skates, MD as Consulting Physician (General Surgery)  Advanced Directives 12/22/2019 12/09/2018 05/23/2018 09/25/2017 08/04/2017 07/29/2016  Does Patient Have a Medical Advance Directive? Yes No No No No No  Type of Paramedic of [place];Living will - - - - -  Does patient want to make changes to medical advance directive? No - Patient declined - - - - -  Would patient like information on creating a medical advance directive? - No - Patient declined No - Patient declined No - Patient declined Yes (MAU/Ambulatory/Procedural Areas - Information given) Yes (MAU/Ambulatory/Procedural Areas - Information given)     Carlson Utilization Over the Past 12 Months: # of hospitalizations or ER visits: 0 # of surgeries: 0  Review of Systems    Patient reports that her overall health is unchanged compared to last year.  Patient Reported Readings (BP, Pulse, CBG, Weight, etc) none  Pain Assessment Pain : No/denies pain     Current Medications & Allergies (verified) Allergies as of 12/22/2019      Reactions   Pravastatin    Muscle aches   Zetia [ezetimibe]    mylagias   Cephalexin Rash   Inside mouth   Sulfa Antibiotics Rash   Including mouth      Medication List       Accurate as of December 22, 2019 10:04 AM. If you have any questions, ask your nurse or doctor.        albuterol 108 (90 Base) MCG/ACT inhaler Commonly known as: VENTOLIN HFA Inhale 2 puffs into the lungs every 6 (six) hours as needed for wheezing or shortness of breath.   amLODipine 10 MG tablet Commonly known as: NORVASC TAKE 1 TABLET EVERY DAY   anastrozole 1 MG tablet Commonly known as: ARIMIDEX Take 1 tablet (1 mg total) by mouth daily.   aspirin 81 MG tablet Take 81 mg by mouth daily.   cholecalciferol 1000 units tablet Commonly known as: VITAMIN D Take 1,000 Units by mouth daily.   desloratadine 5 MG tablet Commonly known as: Clarinex Take 1 tablet (5 mg total) by mouth daily.   fluticasone 50 MCG/ACT nasal spray Commonly known as: FLONASE Place 2 sprays into both nostrils daily.   Garlic 0240 MG Caps Take 1,000 mg by mouth 2 (two) times daily.   levocetirizine 5 MG tablet Commonly  known as: XYZAL TAKE 1 TABLET EVERY EVENING   predniSONE 10 MG (21) Tbpk tablet Commonly known as: STERAPRED UNI-PAK 21 TAB As directed x 6 days   rosuvastatin 10 MG tablet Commonly known as: CRESTOR Take 1 tablet (10 mg total) by mouth daily.       History (reviewed): Past Medical History:  Diagnosis Date  . Allergy   . Atypical pneumonia 09/25/2017  . Cancer ([place])    breast cancer; dx in 04/2018.    [name] Cholesteatoma   . Complication of anesthesia    "slow to wake up, couldn't catch my breath"- has had anesthesia since this occurence and did fine.   . 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 Essential hypertension, benign   . FH: TAH-BSO (total abdominal hysterectomy and bilateral salpingo-oophorectomy)   . Gout   . Hx of appendectomy   . Hyperlipidemia   . Kidney disease   . Osteopenia   . Paget's disease   . Pneumonia   . Stroke ([place])    TIA; resulted in having a carotid endartectomy.   . Vitamin D deficiency    Past Surgical History:  Procedure Laterality Date  . ABDOMINAL HYSTERECTOMY    . APPENDECTOMY    . BREAST LUMPECTOMY Left 05/31/2018  . BREAST LUMPECTOMY WITH RADIOACTIVE SEED LOCALIZATION Left 05/31/2018   Procedure: LEFT BREAST LUMPECTOMY WITH RADIOACTIVE SEED LOCALIZATION;  Surgeon: Fanny Skates, MD;  Location: [facility];  Service: General;  Laterality: Left;  . carotid endoectomy Left   . cataract Bilateral 02/27/14  . EYE SURGERY     catarat surgery bilaterally   Family History  Problem Relation Age of Onset  . Heart disease Mother   . Stroke Mother 71  . Heart disease Father   . Heart attack Father   . Hypertension Son   . Heart attack Paternal Aunt   . Heart disease Paternal Aunt    Social History   Socioeconomic History  . Marital status: Widowed    Spouse name: Not on file  . Number of children: 2  . Years of education: cosemtology certificate  . Highest education level: Some college, no degree  Occupational History  . 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 Smoking status: Former Smoker    Packs/day: 1.00    Years: 20.00    Pack years: 20.00    Types: Cigarettes    Quit date: 08/25/2009    Years since quitting: 10.3  . Smokeless tobacco: Never Used  Vaping Use  . Vaping Use: Never used  Substance and Sexual Activity  . Alcohol use: No  . Drug use: No  . Sexual activity: Not  Currently  Other Topics Concern  . Not on file  Social History Narrative   Lives alone   She exercises everyday and enjoys zumba   Social Determinants of Health   Financial Resource Strain:   . Difficulty of Paying Living Expenses: Not on file  Food Insecurity:   . Worried About Charity fundraiser in the Last Year: Not on file  . Ran Out of Food in the Last Year: Not on file  Transportation Needs:   . Lack of Transportation (Medical): Not on file  . Lack of Transportation (Non-Medical): Not on file  Physical Activity:   . Days of Exercise per Week: Not on file  . Minutes of Exercise per Session: Not on file  Stress:   . Feeling of Stress :  Not on file  Social Connections:   . Frequency of Communication with Friends and Family: Not on file  . Frequency of Social Gatherings with Friends and Family: Not on file  . Attends Religious Services: Not on file  . Active Member of Clubs or Organizations: Not on file  . Attends Archivist Meetings: Not on file  . Marital Status: Not on file    Activities of Daily Living In your present state of health, do you have any difficulty performing the following activities: 12/22/2019  Hearing? N  Vision? N  Difficulty concentrating or making decisions? N  Walking or climbing stairs? N  Dressing or bathing? N  Doing errands, shopping? N  Preparing Food and eating ? N  Using the Toilet? N  In the past six months, have you accidently leaked urine? N  Do you have problems with loss of bowel control? N  Managing your Medications? N  Managing your Finances? N  Housekeeping or managing your Housekeeping? N  Some recent data might be hidden    Patient Education/ Literacy How often do you need to have someone help you when you read instructions, pamphlets, or other written materials from your doctor or pharmacy?: 1 - Never What is the last grade level you completed in school?: 1 year of college  Exercise Current Exercise Habits:  Structured exercise class;Home exercise routine, Type of exercise: walking;Other - see comments (Participates in gym class daily), Time (Minutes): > 60, Frequency (Times/Week): 7, Weekly Exercise (Minutes/Week): 0, Intensity: Moderate  Diet Patient reports consuming 3 meals a day and 2 snack(s) a day Patient reports that her primary diet is: Regular Patient reports that she does have regular access to food.   Depression Screen PHQ 2/9 Scores 12/22/2019 06/28/2019 12/27/2018 12/09/2018 05/23/2018 02/21/2018 01/19/2018  PHQ - 2 Score 0 0 1 0 0 0 0  PHQ- 9 Score - 0 2 0 0 0 -     Fall Risk Fall Risk  12/22/2019 06/28/2019 12/27/2018 12/09/2018 05/23/2018  Falls in the past year? 0 0 0 0 -  Number falls in past yr: - - - - 0     Objective:  Diane Carlson Carlson seemed alert and oriented and she participated appropriately during our telephone visit.  Blood Pressure Weight BMI  BP Readings from Last 3 Encounters:  06/28/19 117/66  05/24/19 136/74  12/27/18 128/76   Wt Readings from Last 3 Encounters:  06/28/19 107 lb (48.5 kg)  05/24/19 107 lb 9.6 oz (48.8 kg)  12/27/18 108 lb (49 kg)   BMI Readings from Last 1 Encounters:  06/28/19 19.57 kg/m    *Unable to obtain current vital signs, weight, and BMI due to telephone visit type  Hearing/Vision  . Diane Carlson did not seem to have difficulty with hearing/understanding during the telephone conversation . Reports that she has not had a formal eye exam by an eye care professional within the past year . Reports that she has not had a formal hearing evaluation within the past year *Unable to fully assess hearing and vision during telephone visit type  Cognitive Function: 6CIT Screen 12/22/2019 12/09/2018  What Year? 0 points 0 points  What month? 0 points 0 points  What time? 0 points 0 points  Count back from 20 0 points 0 points  Months in reverse 4 points 0 points  Repeat phrase 0 points 0 points  Total Score 4 0   (Normal:0-7, Significant for  Dysfunction: >8)  Normal Cognitive Function Screening: Yes  Immunization & Health Maintenance Record Immunization History  Administered Date(s) Administered  . Fluad Quad(high Dose 65+) 12/27/2018  . Influenza Whole 12/28/2009  . Influenza, High Dose Seasonal PF 03/02/2017  . Influenza,inj,Quad PF,6+ Mos 01/15/2014, 01/11/2015, 01/07/2016, 02/21/2018  . Moderna SARS-COVID-2 Vaccination 05/02/2019, 05/30/2019  . Pneumococcal Conjugate-13 08/31/2014  . Pneumococcal Polysaccharide-23 08/25/2012  . Tdap 09/04/2010  . Zoster 03/10/2013    Health Maintenance  Topic Date Due  . DEXA SCAN  09/09/2018  . INFLUENZA VACCINE  10/29/2019  . MAMMOGRAM  04/05/2020  . TETANUS/TDAP  09/03/2020  . COLONOSCOPY  02/26/2027  . COVID-19 Vaccine  Completed  . Hepatitis C Screening  Completed  . PNA vac Low Risk Adult  Completed       Assessment  This is a routine wellness examination for Diane Carlson.  Health Maintenance: Due or Overdue Health Maintenance Due  Topic Date Due  . DEXA SCAN  09/09/2018  . INFLUENZA VACCINE  10/29/2019    Diane Carlson does not need a referral for Community Assistance: Care Management:   no Social Work:    no Prescription Assistance:  no Nutrition/Diabetes Education:  no   Plan:  Personalized Goals Goals Addressed            This Visit's Progress   . AWV   On track    12/09/2018 AWV Goal: Fall Prevention  . Over the next year, patient will decrease their risk for falls by: o Using assistive devices, such as a cane or walker, as needed o Identifying fall risks within their home and correcting them by: - Removing throw rugs - Adding handrails to stairs or ramps - Removing clutter and keeping a clear pathway throughout the home - Increasing light, especially at night - Adding shower handles/bars - Raising toilet seat o Identifying potential personal risk factors for falls: - Medication side  effects - Incontinence/urgency - Vestibular dysfunction - Hearing loss - Musculoskeletal disorders - Neurological disorders - Orthostatic hypotension      . Decrease the likelihood of falling   On track    Remove rugs and small obstacles such as baskets etc. from walkways.      Personalized Health Maintenance & Screening Recommendations   Lung Cancer Screening Recommended: no (Low Dose CT Chest recommended if Age 26-80 years, 30 pack-year currently smoking OR have quit w/in past 15 years) Hepatitis C Screening recommended: no HIV Screening recommended: no  Advanced Directives: Written information was not prepared per patient's request.  Referrals & Orders No orders of the defined types were placed in this encounter.   Follow-up Plan . Follow-up with [name], DO as planned . Schedule 12/29/2019   I have personally reviewed and noted the following in the patient's chart:   . Medical and social history . Use of alcohol, tobacco or illicit drugs  . Current medications and supplements . Functional ability and status . Nutritional status . Physical activity . Advanced directives . List of other physicians . Hospitalizations, surgeries, and ER visits in previous 12 months . Vitals . Screenings to include cognitive, depression, and falls . Referrals and appointments  In addition, I have reviewed and discussed with Diane Carlson certain preventive protocols, quality metrics, and best practice recommendations. A written personalized care plan for preventive services as well as general preventive health recommendations is available and can be mailed to the patient at her request.      Diane Carlson  [id number]/61/4431

## 2019-12-28 DIAGNOSIS — J449 Chronic obstructive pulmonary disease, unspecified: Secondary | ICD-10-CM | POA: Insufficient documentation

## 2019-12-28 NOTE — Progress Notes (Signed)
Subjective: CC: 43 m f/u HTN w/ CKD4 PCP: [name], DO [id number] Diane Carlson is a 74 y.o. female presenting to clinic today for:  1. Hypertension with  CKD4 Patient reports compliance with Norvasc 10 mg and vitamin D daily.  Does not recall last time she saw Dr. Joelyn Oms.  No recent labs.  No chest pain, shortness of breath, change in exercise tolerance.  She remains [facility] at the [facility].  2.  Hyperlipidemia Compliant with Crestor 10 mg daily.  No chest pain, shortness of breath.  3.  COPD Patient is stable with current as needed inhaler.  She is physically active as above.  Denies any worsening of breathing after Covid infection in August.  4.  Osteopenia Compliant with vitamin D as above.  Physically active.  Trying to do weightbearing exercise.  ROS: Per HPI  Allergies  Allergen Reactions   Pravastatin     Muscle aches   Zetia [Ezetimibe]     mylagias   Cephalexin Rash    Inside mouth   Sulfa Antibiotics Rash    Including mouth   Past Medical History:  Diagnosis Date   Allergy    Atypical pneumonia 09/25/2017   Cancer ([place])    breast cancer; dx in 04/2018.    Cholesteatoma    Complication of anesthesia    "slow to wake up, couldn't catch my breath"- has had anesthesia since this occurence and did fine.    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    Vitamin D deficiency     Current Outpatient Medications:    albuterol (VENTOLIN HFA) 108 (90 Base) MCG/ACT inhaler, Inhale 2 puffs into the lungs every 6 (six) hours as needed for wheezing or shortness of breath., Disp: 8 g, Rfl: 2   amLODipine (NORVASC) 10 MG tablet,  TAKE 1 TABLET EVERY DAY, Disp: 90 tablet, Rfl: 0   anastrozole (ARIMIDEX) 1 MG tablet, Take 1 tablet (1 mg total) by mouth daily., Disp: 90 tablet, Rfl: 3   aspirin 81 MG tablet, Take 81 mg by mouth daily.  , Disp: , Rfl:    cholecalciferol (VITAMIN D) 1000 UNITS tablet, Take 1,000 Units by mouth daily., Disp: , Rfl:    desloratadine (CLARINEX) 5 MG tablet, Take 1 tablet (5 mg total) by mouth daily., Disp: 30 tablet, Rfl: 3   fluticasone (FLONASE) 50 MCG/ACT nasal spray, Place 2 sprays into both nostrils daily., Disp: 16 g, Rfl: 6   Garlic 8182 MG CAPS, Take 1,000 mg by mouth 2 (two) times daily. 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[employer]  Tobacco Use   Smoking status: Former Smoker    Packs/day: 1.00    Years: 20.00    Pack years: 20.00    Types: Cigarettes    Quit date: 08/25/2009    Years since quitting: 10.3   Smokeless tobacco: Never Used  Vaping Use   Vaping Use: Never used  Substance and Sexual Activity   Alcohol use: No   Drug use: No   Sexual activity: Not Currently  Other Topics Concern   Not on file  Social History Narrative   Lives alone   She exercises everyday and enjoys zumba   Social Determinants of Health   Financial Resource Strain:    Difficulty of Paying Living Expenses: Not on file  Food Insecurity:    Worried About Charity fundraiser in the Last Year:  Not on file   [employer] of Food in the Last Year: Not on file  Transportation Needs:    Lack of Transportation (Medical): Not on file   Lack of Transportation (Non-Medical): Not on file  Physical Activity:    Days of Exercise per Week: Not on file   Minutes of Exercise per Session: Not on file  Stress:    Feeling of Stress : Not on file  Social Connections:    Frequency of Communication with Friends and Family: Not on file   Frequency of Social Gatherings with Friends and Family: Not on file   Attends Religious Services: Not on file   Active Member of Clubs or Organizations: Not on file   Attends Archivist Meetings: Not on file   Marital Status: Not on file  Intimate Partner Violence:    Fear of Current or Ex-Partner: Not on file   Emotionally Abused: Not on file   Physically Abused: Not on file   Sexually Abused: Not on file   Family History  Problem Relation Age of Onset   Heart disease Mother    Stroke Mother 85   Heart disease Father    Heart attack Father    Hypertension Son    Heart attack Paternal Aunt    Heart disease Paternal Aunt     Objective: Office vital signs reviewed. BP 122/72    Pulse 92    Temp (!) 96.9 F (36.1 C)    Ht 5\' 2"  (1.575 m)    Wt 103 lb 6.4 oz (46.9 kg)    SpO2 97%    BMI 18.91 kg/m   Physical Examination:  General: Awake, alert, elderly female, well appearing. No acute distress HEENT: Normal, sclera white Cardio: regular rate and rhythm, S1S2 heard, no murmurs appreciated Pulm: Globally decreased breath sounds. Air movement fair.  No wheezes, rhonchi or rale.  Normal work of breathing on room air Extremities: warm, well perfused, No edema, cyanosis or clubbing; +2 pulses bilaterally  Assessment/ Plan: 74 y.o. female   1. Essential hypertension, benign Controlled. - Renal Function Panel  2. Mixed hyperlipidemia Check fasting lipid - Lipid Panel - Hepatic Function Panel  3. CKD (chronic kidney  disease) stage 4, GFR 15-29 ml/min (HCC) Will cc lab to Dr Joelyn Oms - Renal Function Panel - VITAMIN D 25 Hydroxy (Vit-D Deficiency, Fractures) - DG [facility] DEXA; Future  4. COPD, severe ([place]) Stable. Continues to be physically active. No complications after COVID  5. Osteopenia of lumbar spine - VITAMIN D 25 Hydroxy (  Vit-D Deficiency, Fractures) - DG [facility] DEXA; Future   No orders of the defined types were placed in this encounter.  No orders of the defined types were placed in this encounter.    [name], DO [place] [phone]

## 2019-12-29 ENCOUNTER — Ambulatory Visit (INDEPENDENT_AMBULATORY_CARE_PROVIDER_SITE_OTHER): Payer: Medicare PPO | Admitting: Family Medicine

## 2019-12-29 ENCOUNTER — Encounter: Payer: Self-pay | Admitting: Family Medicine

## 2019-12-29 ENCOUNTER — Other Ambulatory Visit: Payer: Self-pay

## 2019-12-29 VITALS — BP 122/72 | HR 92 | Temp 96.9°F | Ht 62.0 in | Wt 103.4 lb

## 2019-12-29 DIAGNOSIS — N184 Chronic kidney disease, stage 4 (severe): Secondary | ICD-10-CM | POA: Diagnosis not present

## 2019-12-29 DIAGNOSIS — M8588 Other specified disorders of bone density and structure, other site: Secondary | ICD-10-CM

## 2019-12-29 DIAGNOSIS — E782 Mixed hyperlipidemia: Secondary | ICD-10-CM

## 2019-12-29 DIAGNOSIS — J449 Chronic obstructive pulmonary disease, unspecified: Secondary | ICD-10-CM | POA: Diagnosis not present

## 2019-12-29 DIAGNOSIS — I10 Essential (primary) hypertension: Secondary | ICD-10-CM | POA: Diagnosis not present

## 2019-12-30 LAB — HEPATIC FUNCTION PANEL
ALT: 15 IU/L (ref 0–32)
AST: 24 IU/L (ref 0–40)
Alkaline Phosphatase: 107 IU/L (ref 44–121)
Bilirubin Total: 0.3 mg/dL (ref 0.0–1.2)
Bilirubin, Direct: 0.11 mg/dL (ref 0.00–0.40)
Total Protein: 7.6 g/dL (ref 6.0–8.5)

## 2019-12-30 LAB — RENAL FUNCTION PANEL
Albumin: 4.7 g/dL (ref 3.7–4.7)
BUN/Creatinine Ratio: 12 (ref 12–28)
BUN: 27 mg/dL (ref 8–27)
CO2: 24 mmol/L (ref 20–29)
Calcium: 9.9 mg/dL (ref 8.7–10.3)
Chloride: 105 mmol/L (ref 96–106)
Creatinine, Ser: 2.32 mg/dL — ABNORMAL HIGH (ref 0.57–1.00)
GFR calc Af Amer: 24 mL/min/{1.73_m2} — ABNORMAL LOW (ref >59)
GFR calc non Af Amer: 20 mL/min/{1.73_m2} — ABNORMAL LOW (ref >59)
Glucose: 102 mg/dL — ABNORMAL HIGH (ref 65–99)
Phosphorus: 4.2 mg/dL (ref 3.0–4.3)
Potassium: 4.7 mmol/L (ref 3.5–5.2)
Sodium: 144 mmol/L (ref 134–144)

## 2019-12-30 LAB — LIPID PANEL
Chol/HDL Ratio: 2.3 ratio (ref 0.0–4.4)
Cholesterol, Total: 134 mg/dL (ref 100–199)
HDL: 59 mg/dL (ref >39)
LDL Chol Calc (NIH): 54 mg/dL (ref 0–99)
Triglycerides: 118 mg/dL (ref 0–149)
VLDL Cholesterol Cal: 21 mg/dL (ref 5–40)

## 2019-12-30 LAB — VITAMIN D 25 HYDROXY (VIT D DEFICIENCY, FRACTURES): Vit D, 25-Hydroxy: 38.4 ng/mL (ref 30.0–100.0)

## 2020-01-01 ENCOUNTER — Telehealth: Payer: Self-pay

## 2020-01-01 NOTE — Telephone Encounter (Signed)
Patient aware of lab results.

## 2020-01-17 ENCOUNTER — Other Ambulatory Visit: Payer: Self-pay | Admitting: Family Medicine

## 2020-01-17 ENCOUNTER — Telehealth: Payer: Self-pay

## 2020-01-17 DIAGNOSIS — E782 Mixed hyperlipidemia: Secondary | ICD-10-CM

## 2020-01-17 DIAGNOSIS — I10 Essential (primary) hypertension: Secondary | ICD-10-CM

## 2020-01-17 MED ORDER — ALBUTEROL SULFATE HFA 108 (90 BASE) MCG/ACT IN AERS: 2.0000 | INHALATION_SPRAY | Freq: Four times a day (QID) | RESPIRATORY_TRACT | 2 refills | Status: DC | PRN

## 2020-01-17 MED ORDER — LEVOCETIRIZINE DIHYDROCHLORIDE 5 MG PO TABS: 5.0000 mg | ORAL_TABLET | Freq: Every evening | ORAL | 0 refills | Status: DC

## 2020-01-17 MED ORDER — ROSUVASTATIN CALCIUM 10 MG PO TABS: 10.0000 mg | ORAL_TABLET | Freq: Every day | ORAL | 1 refills | Status: DC

## 2020-01-17 MED ORDER — AMLODIPINE BESYLATE 10 MG PO TABS: 10.0000 mg | ORAL_TABLET | Freq: Every day | ORAL | 1 refills | Status: DC

## 2020-01-17 NOTE — Telephone Encounter (Signed)
  Prescription Request  01/17/2020  What is the name of the medication or equipment? ALL  Have you contacted your pharmacy to request a refill? (if applicable) YES  Which pharmacy would you like this sent to? [facility]  Pt says she needs refills on all of her meds sent to [facility].   Patient notified that their request is being sent to the clinical staff for review and that they should receive a response within 2 business days.

## 2020-01-17 NOTE — Telephone Encounter (Signed)
Medications have been sent

## 2020-01-22 ENCOUNTER — Other Ambulatory Visit: Payer: Self-pay

## 2020-01-22 ENCOUNTER — Ambulatory Visit (INDEPENDENT_AMBULATORY_CARE_PROVIDER_SITE_OTHER): Payer: Medicare PPO

## 2020-01-22 DIAGNOSIS — M8588 Other specified disorders of bone density and structure, other site: Secondary | ICD-10-CM

## 2020-01-22 DIAGNOSIS — N184 Chronic kidney disease, stage 4 (severe): Secondary | ICD-10-CM

## 2020-01-22 DIAGNOSIS — M85851 Other specified disorders of bone density and structure, right thigh: Secondary | ICD-10-CM | POA: Diagnosis not present

## 2020-01-23 ENCOUNTER — Ambulatory Visit (INDEPENDENT_AMBULATORY_CARE_PROVIDER_SITE_OTHER): Payer: Medicare PPO

## 2020-01-23 DIAGNOSIS — Z23 Encounter for immunization: Secondary | ICD-10-CM

## 2020-01-24 DIAGNOSIS — Z90722 Acquired absence of ovaries, bilateral: Secondary | ICD-10-CM | POA: Diagnosis not present

## 2020-01-24 DIAGNOSIS — I10 Essential (primary) hypertension: Secondary | ICD-10-CM | POA: Diagnosis not present

## 2020-01-24 DIAGNOSIS — D0512 Intraductal carcinoma in situ of left breast: Secondary | ICD-10-CM | POA: Diagnosis not present

## 2020-01-24 DIAGNOSIS — Z9049 Acquired absence of other specified parts of digestive tract: Secondary | ICD-10-CM | POA: Diagnosis not present

## 2020-01-24 DIAGNOSIS — Z9079 Acquired absence of other genital organ(s): Secondary | ICD-10-CM | POA: Diagnosis not present

## 2020-01-24 DIAGNOSIS — E785 Hyperlipidemia, unspecified: Secondary | ICD-10-CM | POA: Diagnosis not present

## 2020-01-24 DIAGNOSIS — Z9889 Other specified postprocedural states: Secondary | ICD-10-CM | POA: Diagnosis not present

## 2020-01-24 DIAGNOSIS — Z90710 Acquired absence of both cervix and uterus: Secondary | ICD-10-CM | POA: Diagnosis not present

## 2020-02-01 NOTE — Progress Notes (Signed)
Flu shot given. Pt tolerated  Well.

## 2020-02-26 ENCOUNTER — Telehealth: Payer: Self-pay | Admitting: Family Medicine

## 2020-02-26 NOTE — Telephone Encounter (Signed)
Patient has apt to get her COVID booster shot on Wednesday.  States she was around someone Saturday for a few mins that tested positive for COVID today.  States neither of them had a mask on.  Patient states she feels fine and has no symptoms.  Please advise if it is okay for patient to get her booster shot on Wednesday or if she needs to wait.

## 2020-02-26 NOTE — Telephone Encounter (Signed)
Pt would like to speak to [place] about the COVID shot

## 2020-02-26 NOTE — Telephone Encounter (Signed)
Given that she previously had COVID shots, this is probably fine as long as she remains asymptomatic.

## 2020-02-26 NOTE — Telephone Encounter (Signed)
Patient aware and verbalizes understanding. 

## 2020-03-04 ENCOUNTER — Other Ambulatory Visit: Payer: Self-pay | Admitting: Adult Health

## 2020-03-04 DIAGNOSIS — D0512 Intraductal carcinoma in situ of left breast: Secondary | ICD-10-CM

## 2020-03-13 ENCOUNTER — Ambulatory Visit: Payer: Medicare PPO

## 2020-04-12 ENCOUNTER — Other Ambulatory Visit: Payer: Self-pay | Admitting: Family Medicine

## 2020-04-12 ENCOUNTER — Other Ambulatory Visit: Payer: Medicare PPO

## 2020-04-12 ENCOUNTER — Other Ambulatory Visit: Payer: Self-pay

## 2020-04-12 DIAGNOSIS — N184 Chronic kidney disease, stage 4 (severe): Secondary | ICD-10-CM | POA: Diagnosis not present

## 2020-04-17 ENCOUNTER — Other Ambulatory Visit: Payer: Self-pay | Admitting: *Deleted

## 2020-04-17 MED ORDER — ANASTROZOLE 1 MG PO TABS
1.0000 mg | ORAL_TABLET | Freq: Every day | ORAL | 3 refills | Status: DC
Start: 2020-04-17 — End: 2021-05-21

## 2020-04-24 DIAGNOSIS — N2581 Secondary hyperparathyroidism of renal origin: Secondary | ICD-10-CM | POA: Diagnosis not present

## 2020-04-24 DIAGNOSIS — N184 Chronic kidney disease, stage 4 (severe): Secondary | ICD-10-CM | POA: Diagnosis not present

## 2020-04-24 DIAGNOSIS — I129 Hypertensive chronic kidney disease with stage 1 through stage 4 chronic kidney disease, or unspecified chronic kidney disease: Secondary | ICD-10-CM | POA: Diagnosis not present

## 2020-04-24 DIAGNOSIS — D631 Anemia in chronic kidney disease: Secondary | ICD-10-CM | POA: Diagnosis not present

## 2020-05-16 ENCOUNTER — Ambulatory Visit
Admission: RE | Admit: 2020-05-16 | Discharge: 2020-05-16 | Disposition: A | Discharge status: Home / Self Care | Payer: Medicare PPO | Source: Ambulatory Visit | Attending: Adult Health | Admitting: Adult Health

## 2020-05-16 ENCOUNTER — Other Ambulatory Visit: Payer: Self-pay

## 2020-05-16 DIAGNOSIS — D0512 Intraductal carcinoma in situ of left breast: Secondary | ICD-10-CM

## 2020-05-16 DIAGNOSIS — R922 Inconclusive mammogram: Secondary | ICD-10-CM | POA: Diagnosis not present

## 2020-05-20 NOTE — Assessment & Plan Note (Signed)
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 Current treatment: Adjuvant anastrozole 1 mg daily started 06/2018  Anastrozole Toxicities: Occasional headaches but otherwise tolerating it very well  Breast cancer surveillance: 1.  Breast exam 05/21/2020: Benign 2.  Mammogram 05/16/20: Benign breast density category B   Return to clinic in 1 year for follow-up

## 2020-05-20 NOTE — Progress Notes (Signed)
Patient Care Team: [name], DO as PCP - General (Family Medicine) Rexene Agent, MD as Attending Physician (Nephrology) Nicholas Lose, MD as Consulting Physician (Hematology and Oncology) Fanny Skates, MD as Consulting Physician (General Surgery)  DIAGNOSIS:    ICD-10-CM   1. Carcinoma of upper-inner quadrant of left breast in female, estrogen receptor positive ([place])  C50.212    Z17.0     SUMMARY OF ONCOLOGIC HISTORY: Oncology History  Carcinoma of upper-inner quadrant of left breast in female, estrogen receptor positive ([place])  05/31/2018 Surgery   Left lumpectomy: Grade 1 invasive mucinous adenocarcinoma 0.4 cm with DCIS, margins negative, ER 95%, PR 0%, Ki-67 20%, HER-2 1+ negative, T1 a N0 stage Ia   06/17/2018 Cancer Staging   Staging form: Breast, AJCC 8th Edition - Pathologic: Stage IA (pT1a, pN0, cM0, G1, ER+, PR-, HER2-) - Signed by Eppie Gibson, MD on 06/17/2018   06/2018 -  Anti-estrogen oral therapy   Anastrozole daily, plan 5-7 years     CHIEF COMPLIANT: Follow-up of left breast cancer on anastrozole   INTERVAL HISTORY: Diane Carlson is a 75 y.o. with above-mentioned history of left breast cancer treated with a lumpectomy and is currently on antiestrogen therapy with anastrozole. Bone density scan on 01/22/20 showed osteopenia with a T-score of -2.1 at the right femur. Mammogram on 05/16/20 showed no evidence of malignancy bilaterally. She presents to the clinic today for follow-up.  She has occasional hot flashes but otherwise doing quite well.  ALLERGIES:  is allergic to pravastatin, zetia [ezetimibe], cephalexin, and sulfa antibiotics.  MEDICATIONS:  Current Outpatient Medications  Medication Sig Dispense Refill   albuterol (VENTOLIN HFA) 108 (90 Base) MCG/ACT inhaler Inhale 2 puffs into the lungs every 6 (six) hours as needed for wheezing or shortness of breath. 8 g 2   amLODipine (NORVASC) 10 MG tablet Take 1 tablet (10 mg total) by mouth  daily. 90 tablet 1   anastrozole (ARIMIDEX) 1 MG tablet Take 1 tablet (1 mg total) by mouth daily. 90 tablet 3   aspirin 81 MG tablet Take 81 mg by mouth daily.       cholecalciferol (VITAMIN D) 1000 UNITS tablet Take 1,000 Units by mouth daily.     desloratadine (CLARINEX) 5 MG tablet Take 1 tablet (5 mg total) by mouth daily. 30 tablet 3   fluticasone (FLONASE) 50 MCG/ACT nasal spray Place 2 sprays into both nostrils daily. 16 g 6   Garlic 1324 MG CAPS Take 1,000 mg by mouth 2 (two) times daily.      levocetirizine (XYZAL) 5 MG tablet TAKE 1 TABLET EVERY EVENING. 90 tablet 1   rosuvastatin (CRESTOR) 10 MG tablet Take 1 tablet (10 mg total) by mouth daily. 90 tablet 1   No current facility-administered medications for this visit.    PHYSICAL EXAMINATION: ECOG PERFORMANCE STATUS: 1 - Symptomatic but completely ambulatory  Vitals:   05/21/20 0903  BP: 139/81  Pulse: 95  Resp: 18  Temp: (!) 97.4 F (36.3 C)  SpO2: 98%   Filed Weights   05/21/20 0903  Weight: 107 lb 14.4 oz (48.9 kg)      LABORATORY DATA:  I have reviewed the data as listed CMP Latest Ref Rng & Units 12/29/2019 06/28/2019 12/27/2018  Glucose 65 - 99 mg/dL 102(H) 100(H) 99  BUN 8 - 27 mg/dL 27 23 28(H)  Creatinine 0.57 - 1.00 mg/dL 2.32(H) 2.36(H) 2.42(H)  Sodium 134 - 144 mmol/L 144 143 142  Potassium 3.5 -  5.2 mmol/L 4.7 4.7 4.9  Chloride 96 - 106 mmol/L 105 108(H) 104  CO2 20 - 29 mmol/L 24 21 22   Calcium 8.7 - 10.3 mg/dL 9.9 9.9 9.9  Total Protein 6.0 - 8.5 g/dL 7.6 - -  Total Bilirubin 0.0 - 1.2 mg/dL 0.3 - -  Alkaline Phos 44 - 121 IU/L 107 - -  AST 0 - 40 IU/L 24 - -  ALT 0 - 32 IU/L 15 - -    Lab Results  Component Value Date   WBC 8.3 06/28/2019   HGB 11.6 06/28/2019   HCT 35.0 06/28/2019   MCV 92 06/28/2019   PLT 257 06/28/2019   NEUTROABS 5.9 05/23/2018    ASSESSMENT & PLAN:  Carcinoma of upper-inner quadrant of left breast in female, estrogen receptor positive  ([place]) 05/31/2018:Left lumpectomy: 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 Current treatment: Adjuvant anastrozole 1 mg daily started 06/2018  Anastrozole Toxicities: Occasional hot flashes  Breast cancer surveillance: 1.  Breast exam 05/21/2020: Benign 2.  Mammogram 05/16/20: Benign breast density category B   Return to clinic in 1 year for follow-up   No orders of the defined types were placed in this encounter.  The patient has a good understanding of the overall plan. she agrees with it. she will call with any problems that may develop before the next visit here.  Total time spent: 20 mins including face to face time and time spent for planning, charting and coordination of care  Rulon Eisenmenger, MD, MPH 05/21/2020  I, Cloyde Reams Dorshimer, am acting as scribe for Dr. Nicholas Lose.  I have reviewed the above documentation for accuracy and completeness, and I agree with the above.

## 2020-05-21 ENCOUNTER — Inpatient Hospital Stay: Payer: Medicare PPO | Attending: Hematology and Oncology | Admitting: Hematology and Oncology

## 2020-05-21 ENCOUNTER — Other Ambulatory Visit: Payer: Self-pay

## 2020-05-21 DIAGNOSIS — C50212 Malignant neoplasm of upper-inner quadrant of left female breast: Secondary | ICD-10-CM

## 2020-05-21 DIAGNOSIS — Z170 Estrogen receptor positive status [ER+]: Secondary | ICD-10-CM

## 2020-05-21 DIAGNOSIS — N951 Menopausal and female climacteric states: Secondary | ICD-10-CM | POA: Diagnosis not present

## 2020-05-21 DIAGNOSIS — Z79811 Long term (current) use of aromatase inhibitors: Secondary | ICD-10-CM | POA: Insufficient documentation

## 2020-05-21 DIAGNOSIS — Z79899 Other long term (current) drug therapy: Secondary | ICD-10-CM | POA: Insufficient documentation

## 2020-05-21 DIAGNOSIS — Z7982 Long term (current) use of aspirin: Secondary | ICD-10-CM | POA: Insufficient documentation

## 2020-05-21 DIAGNOSIS — M85851 Other specified disorders of bone density and structure, right thigh: Secondary | ICD-10-CM | POA: Insufficient documentation

## 2020-06-27 ENCOUNTER — Other Ambulatory Visit: Payer: Self-pay | Admitting: Family Medicine

## 2020-06-27 ENCOUNTER — Telehealth: Payer: Self-pay

## 2020-06-27 DIAGNOSIS — N184 Chronic kidney disease, stage 4 (severe): Secondary | ICD-10-CM

## 2020-06-27 NOTE — Telephone Encounter (Signed)
Done

## 2020-06-27 NOTE — Progress Notes (Signed)
Subjective: CC: HTN PCP: [name], DO ZOX:WRUE Diane Carlson is a 75 y.o. female presenting to clinic today for:  1. HTN w/ CKD 4 CKD 4 Managed by Dr Joelyn Oms.   Patient notes that Dr. Joelyn Oms recently mentioned hemodialysis but she "feels fine".  She reports good urine output with normal color.  Does not report any dizziness, chest pain, shortness of breath, edema or falls.  She is compliant with her medications and tries to stay physically active.  2.  Itching Patient reports a small spot on her left hip that itches.  She denies any overt rash.  She has tried putting moisturizer on it but this has not really helped.  Itching is intermittent.  No preceding bite.  3.  COPD, severe Patient reports good control of COPD.  She uses her albuterol only as needed.  No longer on a controller.  Previously managed by Dr. Luan Pulling but he is since retired and she has not established with someone else.  4.  Breast cancer Patient is compliant with Arimidex.  She continues to follow-up with Dr. Lindi Adie as scheduled.  She does not voice any concerns with regards to breast.   ROS: Per HPI  Allergies  Allergen Reactions  . Pravastatin     Muscle aches  . Zetia [Ezetimibe]     mylagias  . Cephalexin Rash    Inside mouth  . Sulfa Antibiotics Rash    Including mouth   Past Medical History:  Diagnosis Date  . Allergy   . Atypical pneumonia 09/25/2017  . Cancer ([place])    breast cancer; dx in 04/2018.   [name] Cholesteatoma   . Complication of anesthesia    "slow to wake up, couldn't catch my breath"- has had anesthesia since this occurence and did fine.   . 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 Essential hypertension, benign   . FH: TAH-BSO (total abdominal hysterectomy and bilateral salpingo-oophorectomy)   . Gout   . Hx of appendectomy   . Hyperlipidemia   . Kidney disease   . Osteopenia   . Paget's disease   . Pneumonia   . Stroke ([place])    TIA; resulted in  having a carotid endartectomy.   . Vitamin D deficiency     Current Outpatient Medications:  .  albuterol (VENTOLIN HFA) 108 (90 Base) MCG/ACT inhaler, Inhale 2 puffs into the lungs every 6 (six) hours as needed for wheezing or shortness of breath., Disp: 8 g, Rfl: 2 .  amLODipine (NORVASC) 10 MG tablet, Take 1 tablet (10 mg total) by mouth daily., Disp: 90 tablet, Rfl: 1 .  anastrozole (ARIMIDEX) 1 MG tablet, Take 1 tablet (1 mg total) by mouth daily., Disp: 90 tablet, Rfl: 3 .  aspirin 81 MG tablet, Take 81 mg by mouth daily.  , Disp: , Rfl:  .  cholecalciferol (VITAMIN D) 1000 UNITS tablet, Take 1,000 Units by mouth daily., Disp: , Rfl:  .  desloratadine (CLARINEX) 5 MG tablet, Take 1 tablet (5 mg total) by mouth daily., Disp: 30 tablet, Rfl: 3 .  fluticasone (FLONASE) 50 MCG/ACT nasal spray, Place 2 sprays into both nostrils daily., Disp: 16 g, Rfl: 6 .  Garlic 4540 MG CAPS, Take 1,000 mg by mouth 2 (two) times daily. , Disp: , Rfl:  .  levocetirizine (XYZAL) 5 MG tablet, TAKE 1 TABLET EVERY EVENING., Disp: 90 tablet, Rfl: 1 .  rosuvastatin (CRESTOR) 10 MG tablet, Take 1 tablet (10 mg total)  by mouth daily., Disp: 90 tablet, Rfl: 1 Social History   Socioeconomic History  . Marital status: Widowed    Spouse name: Not on file  . Number of children: 2  . Years of education: cosemtology certificate  . Highest education level: Some college, no degree  Occupational History  . 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 Smoking status: Former Smoker    Packs/day: 1.00    Years: 20.00    Pack years: 20.00    Types: Cigarettes    Quit date: 08/25/2009    Years since quitting: 10.8  . Smokeless tobacco: Never Used  Vaping Use  . Vaping Use: Never used  Substance and Sexual Activity  . Alcohol use: No  . Drug use: No  . Sexual activity: Not Currently  Other Topics Concern  . Not on file  Social History Narrative   Lives alone   She exercises  everyday and enjoys zumba   Social Determinants of Health   Financial Resource Strain: Not on file  Food Insecurity: Not on file  Transportation Needs: Not on file  Physical Activity: Not on file  Stress: Not on file  Social Connections: Not on file  Intimate Partner Violence: Not on file   Family History  Problem Relation Age of Onset  . Heart disease Mother   . Stroke Mother 15  . Heart disease Father   . Heart attack Father   . Hypertension Son   . Heart attack Paternal Aunt   . Heart disease Paternal Aunt     Objective: Office vital signs reviewed. BP 112/63   Pulse 88   Temp (!) 97.1 F (36.2 C)   Resp 20   Ht 5\' 2"  (1.575 m)   Wt 106 lb (48.1 kg)   SpO2 99%   BMI 19.39 kg/m   Physical Examination:  General: Awake, alert, thin, petite elderly female, No acute distress HEENT: Normal; sclera white.  Moist mucous membranes Cardio: regular rate and rhythm, S1S2 heard, no murmurs appreciated Pulm: Globally decreased breath sounds with normal work of breathing on room air.  No wheezes, rhonchi or rails Extremities: warm, well perfused, No edema, cyanosis or clubbing; +2 pulses bilaterally MSK: Ambulating independently  Assessment/ Plan: 75 y.o. female   Essential hypertension, benign - Plan: amLODipine (NORVASC) 10 MG tablet  CKD (chronic kidney disease) stage 4, GFR 15-29 ml/min (HCC)  Pruritus  COPD, severe (HCC)  Mixed hyperlipidemia - Plan: rosuvastatin (CRESTOR) 10 MG tablet  Ductal carcinoma in situ (DCIS) of left breast  Blood pressure is well controlled.  Continue Norvasc.  Renal has been sent  I obtained a renal function panel for patient on the first which showed slight drop in GFR to 17 from our check in October, which was 20.  Phosphorus normal.  Electrolytes normal.  Hemoglobin is normal.  I have CCed these labs to her nephrologist.  She has globally decreased breath sounds on exam but had no labored breathing on room air nor with  activity.  She will continue her Crestor.  Not due for fasting lipid  Continue Arimidex and regular follow-up with oncology as directed  No orders of the defined types were placed in this encounter.  No orders of the defined types were placed in this encounter.    [name], DO [place] [phone]

## 2020-06-28 ENCOUNTER — Other Ambulatory Visit: Payer: Medicare PPO

## 2020-06-28 ENCOUNTER — Other Ambulatory Visit: Payer: Self-pay

## 2020-06-28 DIAGNOSIS — N184 Chronic kidney disease, stage 4 (severe): Secondary | ICD-10-CM

## 2020-06-29 LAB — CBC WITH DIFFERENTIAL/PLATELET
Basophils Absolute: 0.1 x10E3/uL (ref 0.0–0.2)
Basos: 1 %
EOS (ABSOLUTE): 0.4 x10E3/uL (ref 0.0–0.4)
Eos: 4 %
Hematocrit: 34.6 % (ref 34.0–46.6)
Hemoglobin: 11.7 g/dL (ref 11.1–15.9)
Immature Grans (Abs): 0.1 x10E3/uL (ref 0.0–0.1)
Immature Granulocytes: 1 %
Lymphocytes Absolute: 2.3 x10E3/uL (ref 0.7–3.1)
Lymphs: 25 %
MCH: 30.5 pg (ref 26.6–33.0)
MCHC: 33.8 g/dL (ref 31.5–35.7)
MCV: 90 fL (ref 79–97)
Monocytes Absolute: 0.8 x10E3/uL (ref 0.1–0.9)
Monocytes: 9 %
Neutrophils Absolute: 5.4 x10E3/uL (ref 1.4–7.0)
Neutrophils: 60 %
Platelets: 312 x10E3/uL (ref 150–450)
RBC: 3.83 x10E6/uL (ref 3.77–5.28)
RDW: 12.2 % (ref 11.7–15.4)
WBC: 8.9 x10E3/uL (ref 3.4–10.8)

## 2020-06-29 LAB — RENAL FUNCTION PANEL
Albumin: 4.6 g/dL (ref 3.7–4.7)
BUN/Creatinine Ratio: 9 — ABNORMAL LOW (ref 12–28)
BUN: 26 mg/dL (ref 8–27)
CO2: 21 mmol/L (ref 20–29)
Calcium: 10 mg/dL (ref 8.7–10.3)
Chloride: 104 mmol/L (ref 96–106)
Creatinine, Ser: 2.78 mg/dL — ABNORMAL HIGH (ref 0.57–1.00)
Glucose: 98 mg/dL (ref 65–99)
Phosphorus: 4.1 mg/dL (ref 3.0–4.3)
Potassium: 4.7 mmol/L (ref 3.5–5.2)
Sodium: 144 mmol/L (ref 134–144)
eGFR: 17 mL/min/{1.73_m2} — ABNORMAL LOW (ref >59)

## 2020-07-01 ENCOUNTER — Other Ambulatory Visit: Payer: Self-pay

## 2020-07-01 ENCOUNTER — Encounter: Payer: Self-pay | Admitting: Family Medicine

## 2020-07-01 ENCOUNTER — Telehealth: Payer: Self-pay

## 2020-07-01 ENCOUNTER — Ambulatory Visit: Payer: Medicare PPO | Admitting: Family Medicine

## 2020-07-01 VITALS — BP 112/63 | HR 88 | Temp 97.1°F | Resp 20 | Ht 62.0 in | Wt 106.0 lb

## 2020-07-01 DIAGNOSIS — E782 Mixed hyperlipidemia: Secondary | ICD-10-CM | POA: Diagnosis not present

## 2020-07-01 DIAGNOSIS — I10 Essential (primary) hypertension: Secondary | ICD-10-CM | POA: Diagnosis not present

## 2020-07-01 DIAGNOSIS — N184 Chronic kidney disease, stage 4 (severe): Secondary | ICD-10-CM

## 2020-07-01 DIAGNOSIS — D0512 Intraductal carcinoma in situ of left breast: Secondary | ICD-10-CM | POA: Diagnosis not present

## 2020-07-01 DIAGNOSIS — L299 Pruritus, unspecified: Secondary | ICD-10-CM | POA: Diagnosis not present

## 2020-07-01 DIAGNOSIS — J449 Chronic obstructive pulmonary disease, unspecified: Secondary | ICD-10-CM | POA: Diagnosis not present

## 2020-07-01 MED ORDER — AMLODIPINE BESYLATE 10 MG PO TABS
10.0000 mg | ORAL_TABLET | Freq: Every day | ORAL | 3 refills | Status: DC
Start: 2020-07-01 — End: 2020-12-31

## 2020-07-01 MED ORDER — ROSUVASTATIN CALCIUM 10 MG PO TABS: 10.0000 mg | ORAL_TABLET | Freq: Every day | ORAL | 3 refills | Status: DC

## 2020-07-10 ENCOUNTER — Other Ambulatory Visit: Payer: Self-pay

## 2020-07-10 ENCOUNTER — Other Ambulatory Visit: Payer: Medicare PPO

## 2020-07-10 DIAGNOSIS — N184 Chronic kidney disease, stage 4 (severe): Secondary | ICD-10-CM | POA: Diagnosis not present

## 2020-07-25 DIAGNOSIS — N184 Chronic kidney disease, stage 4 (severe): Secondary | ICD-10-CM | POA: Diagnosis not present

## 2020-07-25 DIAGNOSIS — D631 Anemia in chronic kidney disease: Secondary | ICD-10-CM | POA: Diagnosis not present

## 2020-07-25 DIAGNOSIS — N2581 Secondary hyperparathyroidism of renal origin: Secondary | ICD-10-CM | POA: Diagnosis not present

## 2020-07-25 DIAGNOSIS — I129 Hypertensive chronic kidney disease with stage 1 through stage 4 chronic kidney disease, or unspecified chronic kidney disease: Secondary | ICD-10-CM | POA: Diagnosis not present

## 2020-09-11 DIAGNOSIS — Z23 Encounter for immunization: Secondary | ICD-10-CM | POA: Diagnosis not present

## 2020-10-25 ENCOUNTER — Telehealth: Payer: Self-pay | Admitting: Family Medicine

## 2020-10-25 NOTE — Telephone Encounter (Signed)
  Prescription Request  10/25/2020  What is the name of the medication or equipment? Albuterol Extension  Have you contacted your pharmacy to request a refill? (if applicable) yes  Which pharmacy would you like this sent to? [facility]   Patient notified that their request is being sent to the clinical staff for review and that they should receive a response within 2 business days.

## 2020-10-25 NOTE — Telephone Encounter (Signed)
Pt aware.

## 2020-10-25 NOTE — Telephone Encounter (Signed)
Pt would like a spacer for her inhaler

## 2020-10-25 NOTE — Telephone Encounter (Signed)
LMOVM for clarification on what pt is needing

## 2020-10-25 NOTE — Telephone Encounter (Signed)
I've got a free one that I placed up front for her to pick up at her convenience.

## 2020-11-06 ENCOUNTER — Ambulatory Visit: Payer: Medicare PPO | Admitting: Family Medicine

## 2020-11-06 ENCOUNTER — Encounter: Payer: Self-pay | Admitting: Family Medicine

## 2020-11-06 DIAGNOSIS — J0110 Acute frontal sinusitis, unspecified: Secondary | ICD-10-CM

## 2020-11-06 MED ORDER — AMOXICILLIN-POT CLAVULANATE 875-125 MG PO TABS: 1.0000 | ORAL_TABLET | Freq: Two times a day (BID) | ORAL | 0 refills | Status: AC

## 2020-11-06 NOTE — Progress Notes (Signed)
   Virtual Visit  Note Due to COVID-19 pandemic this visit was conducted virtually. This visit type was conducted due to national recommendations for restrictions regarding the COVID-19 Pandemic (e.g. social distancing, sheltering in place) in an effort to limit this patient's exposure and mitigate transmission in our community. All issues noted in this document were discussed and addressed.  A physical exam was not performed with this format.  I connected with Diane Carlson on 11/06/20 at 1005 by telephone and verified that I am speaking with the correct person using two identifiers. Anarosa Haydyn Liddell is currently located at home and no one is currently with her during the visit. The provider, Gwenlyn Perking, FNP is located in their office at time of visit.  I discussed the limitations, risks, security and privacy concerns of performing an evaluation and management service by telephone and the availability of in person appointments. I also discussed with the patient that there may be a patient responsible charge related to this service. The patient expressed understanding and agreed to proceed.  CC: sinusitis. Covid exposure  History and Present Illness:  HPI Elmire reports sinus drainage, dry cough, and frontal tenderness ofr 2-3 weeks. Her symptoms continue to worsen. She has been taking xyzal, flonase, and mucinex for her symptoms. She denies fever, body aches, chills, sore throat, HA, nausea, vomiting, diarrhea, shortness of breath, or chest pain. She was around someone who tested positive for covid on 8/6.  She was around this person while outside. She has had her daughter, who is a Marine scientist, do a home test for her for the last 3 days. Each of these have been negative. She reports that she often gets sinus infections that require abx.     ROS As per HPI.   Observations/Objective: Alert and oriented x 3. Able to speak in full sentences without difficulty.    Assessment and Plan: Torry  was seen today for covid exposure and sinusitis.  Diagnoses and all orders for this visit:  Acute non-recurrent frontal sinusitis Declined Covid test today as she has had 3 negative home tests. Augmentin ordered. Continue xyzal, flonase, and mucinex. Stay well hydrated.  -     amoxicillin-clavulanate (AUGMENTIN) 875-125 MG tablet; Take 1 tablet by mouth 2 (two) times daily for 7 days.    Follow Up Instructions: Return to office for new or worsening symptoms, or if symptoms persist.     I discussed the assessment and treatment plan with the patient. The patient was provided an opportunity to ask questions and all were answered. The patient agreed with the plan and demonstrated an understanding of the instructions.   The patient was advised to call back or seek an in-person evaluation if the symptoms worsen or if the condition fails to improve as anticipated.  The above assessment and management plan was discussed with the patient. The patient verbalized understanding of and has agreed to the management plan. Patient is aware to call the clinic if symptoms persist or worsen. Patient is aware when to return to the clinic for a follow-up visit. Patient educated on when it is appropriate to go to the emergency department.   Time call ended:  1016  I provided 11 minutes of  non face-to-face time during this encounter.    Gwenlyn Perking, FNP

## 2020-11-11 ENCOUNTER — Other Ambulatory Visit: Payer: Self-pay | Admitting: Family Medicine

## 2020-11-29 ENCOUNTER — Other Ambulatory Visit: Payer: Self-pay

## 2020-11-29 ENCOUNTER — Encounter: Payer: Self-pay | Admitting: Family Medicine

## 2020-11-29 ENCOUNTER — Ambulatory Visit: Payer: Medicare PPO | Admitting: Family Medicine

## 2020-11-29 VITALS — BP 130/77 | HR 103 | Temp 97.4°F | Ht 62.0 in | Wt 102.8 lb

## 2020-11-29 DIAGNOSIS — D692 Other nonthrombocytopenic purpura: Secondary | ICD-10-CM | POA: Diagnosis not present

## 2020-11-29 DIAGNOSIS — N184 Chronic kidney disease, stage 4 (severe): Secondary | ICD-10-CM | POA: Diagnosis not present

## 2020-11-29 DIAGNOSIS — M79605 Pain in left leg: Secondary | ICD-10-CM

## 2020-11-29 NOTE — Progress Notes (Signed)
Subjective: CC:leg pain PCP: [name], DO UTM:LYYT Diane Carlson is a Diane Carlson presenting to clinic today for:  1. Leg pain She reports about a month history of intermittent left-sided leg pain.  She points over the lateral knee and into the left lateral calf as the area of pain.  She notes that this has interfered with her ability to exercise normally.  She is typically very active.  She has multiple pairs of tennis shoes but has not necessarily switched any of them out.  Not currently taking anything for this.  She feels that she hydrates adequately.  Not report any discoloration of the feet or pain radiating down into the feet.  Medical history significant for CKD 4 so no oral NSAIDs taken.   ROS: Per HPI  Allergies  Allergen Reactions   Pravastatin     Muscle aches   Zetia [Ezetimibe]     mylagias   Cephalexin Rash    Inside mouth   Sulfa Antibiotics Rash    Including mouth   Past Medical History:  Diagnosis Date   Allergy    Atypical pneumonia 09/25/2017   Cancer ([place])    breast cancer; dx in 04/2018.    Cholesteatoma    Complication of anesthesia    "slow to wake up, couldn't catch my breath"- has had anesthesia since this occurence and did fine.    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   Essential hypertension, benign    FH: TAH-BSO (total abdominal hysterectomy and bilateral salpingo-oophorectomy)    Gout    Hx of appendectomy    Hyperlipidemia    Kidney disease    Osteopenia    Paget's disease    Pneumonia    Stroke ([place])    TIA; resulted in having a carotid endartectomy.    Vitamin D deficiency     Current Outpatient Medications:    albuterol (VENTOLIN HFA) 108 (90 Base) MCG/ACT inhaler, Inhale 2 puffs into the lungs every 6 (six) hours as needed for wheezing or shortness of breath., Disp: 8 g, Rfl: 2   amLODipine (NORVASC) 10 MG tablet, Take 1 tablet (10 mg total) by mouth daily., Disp: 90 tablet, Rfl: 3   anastrozole  (ARIMIDEX) 1 MG tablet, Take 1 tablet (1 mg total) by mouth daily., Disp: 90 tablet, Rfl: 3   aspirin 81 MG tablet, Take 81 mg by mouth daily., Disp: , Rfl:    cholecalciferol (VITAMIN D) 1000 UNITS tablet, Take 1,000 Units by mouth daily., Disp: , Rfl:    fluticasone (FLONASE) 50 MCG/ACT nasal spray, Place 2 sprays into both nostrils daily., Disp: 16 g, Rfl: 6   Garlic 0354 MG CAPS, Take 1,000 mg by mouth 2 (two) times daily. 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[employer]  Tobacco Use   Smoking status: Former    Packs/day: 1.00  Years: 20.00    Pack years: 20.00    Types: Cigarettes    Quit date: 08/25/2009    Years since quitting: 11.2   Smokeless tobacco: Never  Vaping Use   Vaping Use: Never used  Substance and Sexual Activity   Alcohol use: No   Drug use: No   Sexual activity: Not Currently  Other Topics Concern   Not on file  Social History Narrative   Lives alone   She exercises everyday and enjoys zumba   Social Determinants of Health   Financial Resource Strain: Not on file  Food Insecurity: Not on file  Transportation Needs: Not on file  Physical Activity: Not on file  Stress: Not on file  Social Connections: Not on file  Intimate Partner Violence: Not on file   Family History  Problem Relation Age of Onset   Heart disease Mother    Stroke Mother 60   Heart disease Father    Heart attack Father    Hypertension Son    Heart attack  Paternal Aunt    Heart disease Paternal Aunt     Objective: Office vital signs reviewed. BP 130/77   Pulse (!) 103   Temp (!) 97.4 F (36.3 C) (Temporal)   Ht 5\' 2"  (1.575 m)   Wt 102 lb 12.8 oz (46.6 kg)   BMI 18.80 kg/m   Physical Examination:  General: Awake, alert, well-appearing elderly Carlson., No acute distress Extremities: warm, well perfused, No edema, cyanosis or clubbing; +2 pedal pulses bilaterally MSK: Normal gait and normal station  Left leg: Tenderness palpation over the left lateral knee and mild tenderness into the left calf.  No palpable bony abnormalities.  No gross swelling.  No warmth.  No erythema. Skin: Several purpura noted along the lower extremities bilaterally  Assessment/ Plan: Diane Carlson   Left leg pain - Plan: Magnesium, Renal Function Panel, Ferritin, VITAMIN D 25 Hydroxy (Vit-D Deficiency, Fractures)  CKD (chronic kidney disease) stage 4, GFR 15-29 ml/min (HCC) - Plan: Magnesium, Renal Function Panel, Ferritin, VITAMIN D 25 Hydroxy (Vit-D Deficiency, Fractures)  Purpura senilis (HCC) - Plan: Ferritin, CBC  I think that this is popliteal tendinopathy but given her renal dysfunction I did evaluate pulses to make sure this was not a peripheral artery disease reflected as leg pain.  She had excellent pulses on exam.  We will check for any electrolyte abnormalities but I anticipate home physical therapy, ice at the affected area and topical NSAIDs may improve.  If not she will contact me and we can plan to refer to sports medicine center for further evaluation  Purpura senilis is likely secondary to age.  She is not on any anticoagulants  No orders of the defined types were placed in this encounter.  No orders of the defined types were placed in this encounter.    [name], DO [place] [phone]

## 2020-11-29 NOTE — Patient Instructions (Addendum)
[email or web address]  Has some exercises you can do to rehab that leg.  I think this is a popliteal tendon injury.

## 2020-11-30 LAB — CBC
Hematocrit: 33.5 % — ABNORMAL LOW (ref 34.0–46.6)
Hemoglobin: 11.4 g/dL (ref 11.1–15.9)
MCH: 30.3 pg (ref 26.6–33.0)
MCHC: 34 g/dL (ref 31.5–35.7)
MCV: 89 fL (ref 79–97)
Platelets: 277 10*3/uL (ref 150–450)
RBC: 3.76 x10E6/uL — ABNORMAL LOW (ref 3.77–5.28)
RDW: 13 % (ref 11.7–15.4)
WBC: 8.8 10*3/uL (ref 3.4–10.8)

## 2020-11-30 LAB — RENAL FUNCTION PANEL
Albumin: 4.6 g/dL (ref 3.7–4.7)
BUN/Creatinine Ratio: 10 — ABNORMAL LOW (ref 12–28)
BUN: 26 mg/dL (ref 8–27)
CO2: 24 mmol/L (ref 20–29)
Calcium: 9.7 mg/dL (ref 8.7–10.3)
Chloride: 101 mmol/L (ref 96–106)
Creatinine, Ser: 2.58 mg/dL — ABNORMAL HIGH (ref 0.57–1.00)
Glucose: 103 mg/dL — ABNORMAL HIGH (ref 65–99)
Phosphorus: 4.8 mg/dL — ABNORMAL HIGH (ref 3.0–4.3)
Potassium: 4.8 mmol/L (ref 3.5–5.2)
Sodium: 140 mmol/L (ref 134–144)
eGFR: 19 mL/min/{1.73_m2} — ABNORMAL LOW (ref >59)

## 2020-11-30 LAB — FERRITIN: Ferritin: 64 ng/mL (ref 15–150)

## 2020-11-30 LAB — MAGNESIUM: Magnesium: 2.4 mg/dL — ABNORMAL HIGH (ref 1.6–2.3)

## 2020-11-30 LAB — VITAMIN D 25 HYDROXY (VIT D DEFICIENCY, FRACTURES): Vit D, 25-Hydroxy: 45.9 ng/mL (ref 30.0–100.0)

## 2020-12-13 DIAGNOSIS — N184 Chronic kidney disease, stage 4 (severe): Secondary | ICD-10-CM | POA: Diagnosis not present

## 2020-12-23 ENCOUNTER — Ambulatory Visit (INDEPENDENT_AMBULATORY_CARE_PROVIDER_SITE_OTHER): Payer: Medicare PPO

## 2020-12-23 VITALS — Ht 62.0 in | Wt 102.0 lb

## 2020-12-23 DIAGNOSIS — Z0000 Encounter for general adult medical examination without abnormal findings: Secondary | ICD-10-CM | POA: Diagnosis not present

## 2020-12-23 NOTE — Progress Notes (Signed)
Subjective:   Diane Carlson is a 75 y.o. female who presents for Medicare Annual (Subsequent) preventive examination.  Virtual Visit via Telephone Note  I connected with  Diane Carlson on 12/23/20 at  2:00 PM EDT by telephone and verified that I am speaking with the correct person using two identifiers.  Location: Patient: Home Provider: [facility] Persons participating in the virtual visit: patient/Nurse Health Advisor   I discussed the limitations, risks, security and privacy concerns of performing an evaluation and management service by telephone and the availability of in person appointments. The patient expressed understanding and agreed to proceed.  Interactive audio and video telecommunications were attempted between this nurse and patient, however failed, due to patient having technical difficulties OR patient did not have access to video capability.  We continued and completed visit with audio only.  Some vital signs may be absent or patient reported.   Diane Helzer E Willis Holquin, LPN   Review of Systems     Cardiac Risk Factors include: advanced age (>53men, >88 women);dyslipidemia;hypertension;Other (see comment), Risk factor comments: COPD, atherosclerosis, hx breast cancer and treatment     Objective:    Today's Vitals   12/23/20 1344  Weight: 102 lb (46.3 kg)  Height: 5\' 2"  (1.575 m)   Body mass index is 18.66 kg/m.  Advanced Directives 12/23/2020 12/22/2019 12/09/2018 05/23/2018 09/25/2017 08/04/2017 07/29/2016  Does Patient Have a Medical Advance Directive? Yes Yes No No No No No  Type of Paramedic of [place];Living will [place];Living will - - - - -  Does patient want to make changes to medical advance directive? - No - Patient declined - - - - -  Copy of [place] in Chart? No - copy requested - - - - - -  Would patient like information on creating a medical advance directive? - - No - Patient declined No -  Patient declined No - Patient declined Yes (MAU/Ambulatory/Procedural Areas - Information given) Yes (MAU/Ambulatory/Procedural Areas - Information given)    Current Medications (verified) Outpatient Encounter Medications as of 12/23/2020  Medication Sig   albuterol (VENTOLIN HFA) 108 (90 Base) MCG/ACT inhaler Inhale 2 puffs into the lungs every 6 (six) hours as needed for wheezing or shortness of breath.   amLODipine (NORVASC) 10 MG tablet Take 1 tablet (10 mg total) by mouth daily.   anastrozole (ARIMIDEX) 1 MG tablet Take 1 tablet (1 mg total) by mouth daily.   aspirin 81 MG tablet Take 81 mg by mouth daily.   cholecalciferol (VITAMIN D) 1000 UNITS tablet Take 1,000 Units by mouth daily.   fluticasone (FLONASE) 50 MCG/ACT nasal spray Place 2 sprays into both nostrils daily.   Garlic 5102 MG CAPS Take 1,000 mg by mouth 2 (two) times daily.    levocetirizine (XYZAL) 5 MG tablet TAKE 1 TABLET EVERY EVENING.   rosuvastatin (CRESTOR) 10 MG tablet Take 1 tablet (10 mg total) by mouth daily.   Zinc Sulfate (ZINC 15 PO) Take by mouth.   No facility-administered encounter medications on file as of 12/23/2020.    Allergies (verified) Pravastatin, Zetia [ezetimibe], Cephalexin, and Sulfa antibiotics   History: Past Medical History:  Diagnosis Date   Allergy    Atypical pneumonia 09/25/2017   Cancer ([place])    breast cancer; dx in 04/2018.    Cholesteatoma    Complication of anesthesia    "slow to wake up, couldn't catch my breath"- has had anesthesia since this occurence and did  fine.    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    Vitamin D deficiency    Past Surgical History:   Procedure Laterality Date   ABDOMINAL HYSTERECTOMY     APPENDECTOMY     BREAST LUMPECTOMY Left 05/31/2018   BREAST LUMPECTOMY WITH RADIOACTIVE SEED LOCALIZATION Left 05/31/2018   Procedure: LEFT BREAST LUMPECTOMY WITH RADIOACTIVE SEED LOCALIZATION;  Surgeon: Fanny Skates, MD;  Location: [facility];  Service: General;  Laterality: Left;   carotid endoectomy Left    cataract Bilateral 02/27/14   EYE SURGERY     catarat surgery bilaterally   Family History  Problem Relation Age of Onset   Heart disease Mother    Stroke Mother 59   Heart disease Father    Heart attack Father    Hypertension Son    Heart attack Paternal Aunt    Heart disease Paternal Aunt    Social History   Socioeconomic History   Marital status: Widowed    Spouse name: Not on file   Number of children: 2   Years of education: cosemtology certificate   Highest education level: Some college, no degree  Occupational History   Occupation: Retired    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[employer]  Tobacco Use   Smoking status: Former    Packs/day: 1.00    Years: 20.00    Pack years: 20.00    Types: Cigarettes    Quit date: 08/25/2009    Years since quitting: 11.3   Smokeless tobacco: Never  Vaping Use   Vaping Use: Never used  Substance and Sexual Activity   Alcohol use: No   Drug use: No   Sexual activity: Not Currently  Other Topics Concern   Not on file  Social History Narrative   Lives alone   She exercises everyday and enjoys zumba   Social Determinants of Health   Financial Resource Strain: Low Risk    Difficulty of Paying Living Expenses: Not hard at all  Food Insecurity: No Food Insecurity   Worried About Charity fundraiser in the Last Year: Never true   [place] in the Last Year: Never true  Transportation Needs: No Transportation Needs   Lack of Transportation (Medical): No   Lack of Transportation (Non-Medical): No  Physical Activity: Sufficiently Active   Days of Exercise per Week: 5  days   Minutes of Exercise per Session: 120 min  Stress: No Stress Concern Present   Feeling of Stress : Not at all  Social Connections: Moderately Integrated   Frequency of Communication with Friends and Family: More than three times a week   Frequency of Social Gatherings with Friends and Family: More than three times a week   Attends Religious Services: More than 4 times per year   Active Member of [employer] or Organizations: Yes   Attends Archivist Meetings: More than 4 times per year   Marital Status: Widowed    Tobacco Counseling Counseling given: Not Answered   Clinical Intake:  Pre-visit preparation completed: Yes  Pain : No/denies pain     BMI - recorded: 18.66 Nutritional  Status: BMI <19  Underweight Nutritional Risks: None Diabetes: No  How often do you need to have someone help you when you read instructions, pamphlets, or other written materials from your doctor or pharmacy?: 1 - Never  Diabetic? No  Interpreter Needed?: No  Information entered by :: Cejay Cambre, LPN   Activities of Daily Living In your present state of health, do you have any difficulty performing the following activities: 12/23/2020  Hearing? N  Vision? N  Difficulty concentrating or making decisions? N  Walking or climbing stairs? N  Dressing or bathing? N  Doing errands, shopping? N  Preparing Food and eating ? N  Using the Toilet? N  In the past six months, have you accidently leaked urine? N  Do you have problems with loss of bowel control? N  Managing your Medications? N  Managing your Finances? N  Housekeeping or managing your Housekeeping? N  Some recent data might be hidden    Patient Care Team: [name], DO as PCP - General (Family Medicine) Rexene Agent, MD as Attending Physician (Nephrology) Nicholas Lose, MD as Consulting Physician (Hematology and Oncology) Fanny Skates, MD as Consulting Physician (General Surgery) [facility]  Indicate any recent Medical Services you may have received from other than [facility] providers in the past year (date may be approximate).     Assessment:   This is a routine wellness examination for [place].  Hearing/Vision screen Hearing Screening - Comments:: Denies hearing difficulties Vision Screening - Comments:: Wears reading glasses only prn - up to date with annual eye exams with Dr Katy Fitch  Dietary issues and exercise activities discussed: Current Exercise Habits: Home exercise routine, Type of exercise: walking;strength training/weights;stretching, Time (Minutes): 60, Frequency (Times/Week): 5, Weekly Exercise (Minutes/Week): 300, Intensity: Moderate, Exercise limited by: respiratory conditions(s);cardiac condition(s)   Goals Addressed             This Visit's Progress    Decrease the likelihood of falling   On track    Remove rugs and small obstacles such as baskets etc. from walkways.     Eat more fruits and vegetables   On track      Depression Screen PHQ 2/9 Scores 12/23/2020 11/29/2020 07/01/2020 12/29/2019 12/22/2019 06/28/2019 12/27/2018  PHQ - 2 Score 0 0 0 0 0 0 1  PHQ- 9 Score 0 0 - - - 0 2    Fall Risk Fall Risk  12/23/2020 11/29/2020 07/01/2020 12/29/2019 12/22/2019  Falls in the past year? 0 0 0 0 0  Number falls in past yr: 0 - - - -  Injury with Fall? 0 - - - -  Risk for fall due to : No Fall Risks - - - -  Follow up Falls prevention discussed - - - -    FALL RISK PREVENTION PERTAINING TO THE HOME:  Any stairs in or around the home? No  If so, are there any without handrails? No  Home free of loose throw rugs in walkways, pet beds, electrical cords, etc? Yes  Adequate lighting in your home to reduce risk of falls? Yes   ASSISTIVE DEVICES UTILIZED TO PREVENT FALLS:  Life alert? No  Use of a cane, walker or w/c? No  Grab bars in the bathroom? Yes  Shower chair or bench in shower? No  Elevated toilet seat or a handicapped toilet? Yes   TIMED UP  AND GO:  Was the test performed? No . Telephonic visit  Cognitive Function: MMSE -  Mini Mental State Exam 08/04/2017 07/29/2016  Orientation to time 5 5  Orientation to Place 5 5  Registration 3 3  Attention/ Calculation 4 5  Recall 3 3  Language- name 2 objects 2 2  Language- repeat 1 1  Language- follow 3 step command 3 3  Language- read & follow direction 1 1  Write a sentence 1 1  Copy design 0 1  Total score 28 30     6CIT Screen 12/23/2020 12/22/2019 12/09/2018  What Year? 0 points 0 points 0 points  What month? 0 points 0 points 0 points  What time? 0 points 0 points 0 points  Count back from 20 0 points 0 points 0 points  Months in reverse 4 points 4 points 0 points  Repeat phrase 0 points 0 points 0 points  Total Score 4 4 0    Immunizations Immunization History  Administered Date(s) Administered   Fluad Quad(high Dose 65+) 12/27/2018, 01/23/2020   Influenza Whole 12/28/2009   Influenza, High Dose Seasonal PF 03/02/2017   Influenza,inj,Quad PF,6+ Mos 01/15/2014, 01/11/2015, 01/07/2016, 02/21/2018   Moderna Sars-Covid-2 Vaccination 05/02/2019, 05/30/2019, 02/28/2020, 09/11/2020   Pneumococcal Conjugate-13 08/31/2014   Pneumococcal Polysaccharide-23 08/25/2012   Tdap 09/04/2010   Zoster, Live 03/10/2013    TDAP status: Due, Education has been provided regarding the importance of this vaccine. Advised may receive this vaccine at local pharmacy or Health Dept. Aware to provide a copy of the vaccination record if obtained from local pharmacy or Health Dept. Verbalized acceptance and understanding.  Flu Vaccine status: Due, Education has been provided regarding the importance of this vaccine. Advised may receive this vaccine at local pharmacy or Health Dept. Aware to provide a copy of the vaccination record if obtained from local pharmacy or Health Dept. Verbalized acceptance and understanding.  Pneumococcal vaccine status: Up to date  Covid-19 vaccine status: Completed  vaccines  Qualifies for Shingles Vaccine? Yes   Zostavax completed Yes   Shingrix Completed?: No.    Education has been provided regarding the importance of this vaccine. Patient has been advised to call insurance company to determine out of pocket expense if they have not yet received this vaccine. Advised may also receive vaccine at local pharmacy or Health Dept. Verbalized acceptance and understanding.  Screening Tests Health Maintenance  Topic Date Due   Zoster Vaccines- Shingrix (1 of 2) Never done   TETANUS/TDAP  09/03/2020   INFLUENZA VACCINE  10/28/2020   COVID-19 Vaccine (5 - Booster for Moderna series) 01/11/2021   MAMMOGRAM  05/16/2021   DEXA SCAN  01/21/2022   COLONOSCOPY (Pts 45-61yrs Insurance coverage will need to be confirmed)  02/26/2027   Hepatitis C Screening  Completed   HPV VACCINES  Aged Out    Health Maintenance  Health Maintenance Due  Topic Date Due   Zoster Vaccines- Shingrix (1 of 2) Never done   TETANUS/TDAP  09/03/2020   INFLUENZA VACCINE  10/28/2020    Colorectal cancer screening: No longer required.   Mammogram status: Completed 05/16/2020. Repeat every year  Bone Density status: Completed 01/22/2020. Results reflect: Bone density results: OSTEOPENIA. Repeat every 2 years.  Lung Cancer Screening: (Low Dose CT Chest recommended if Age 72-80 years, 30 pack-year currently smoking OR have quit w/in 15years.) does not qualify.   Additional Screening:  Hepatitis C Screening: does qualify; Completed 03/04/2015  Vision Screening: Recommended annual ophthalmology exams for early detection of glaucoma and other disorders of the eye. Is the patient up to date with  their annual eye exam?  Yes  Who is the provider or what is the name of the office in which the patient attends annual eye exams? [facility] If pt is not established with a provider, would they like to be referred to a provider to establish care? No .   Dental Screening: Recommended annual dental  exams for proper oral hygiene  Community Resource Referral / Chronic Care Management: CRR required this visit?  No   CCM required this visit?  No      Plan:     I have personally reviewed and noted the following in the patient's chart:   Medical and social history Use of alcohol, tobacco or illicit drugs  Current medications and supplements including opioid prescriptions.  Functional ability and status Nutritional status Physical activity Advanced directives List of other physicians Hospitalizations, surgeries, and ER visits in previous 12 months Vitals Screenings to include cognitive, depression, and falls Referrals and appointments  In addition, I have reviewed and discussed with patient certain preventive protocols, quality metrics, and best practice recommendations. A written personalized care plan for preventive services as well as general preventive health recommendations were provided to patient.     Diane Hammond, LPN   5/37/4827   Nurse Notes: None

## 2020-12-23 NOTE — Patient Instructions (Signed)
Diane Carlson , Thank you for taking time to come for your Medicare Wellness Visit. I appreciate your ongoing commitment to your health goals. Please review the following plan we discussed and let me know if I can assist you in the future.   Screening recommendations/referrals: Colonoscopy: Done 02/25/2017 - no repeat required Mammogram: done 05/16/2020 - Repeat annually  Bone Density: Done 01/22/2020 - Repeat every 2 years Recommended yearly ophthalmology/optometry visit for glaucoma screening and checkup Recommended yearly dental visit for hygiene and checkup  Vaccinations: Influenza vaccine: Done 01/23/2020 - Repeat annually  Pneumococcal vaccine: Done 08/25/2012 & 08/31/2014 Tdap vaccine: Done 09/04/2010 - Repeat in 10 years  Shingles vaccine: Zostavax done 2014 - Shingrix discussed. Please contact your pharmacy for coverage information.     Covid-19:done 05/02/2019, 05/30/2019, 02/28/2020, & 09/11/2020  Advanced directives: Please bring a copy of your health care power of attorney and living will to the office to be added to your chart at your convenience.   Conditions/risks identified: Keep up the great work! Be sure you are eating enough! 3 meals per day or 5-6 small meals daily - high protein plus fruits and vegetables.  Next appointment: Follow up in one year for your annual wellness visit    Preventive Care 65 Years and Older, Female Preventive care refers to lifestyle choices and visits with your health care provider that can promote health and wellness. What does preventive care include? A yearly physical exam. This is also called an annual well check. Dental exams once or twice a year. Routine eye exams. Ask your health care provider how often you should have your eyes checked. Personal lifestyle choices, including: Daily care of your teeth and gums. Regular physical activity. Eating a healthy diet. Avoiding tobacco and drug use. Limiting alcohol use. Practicing safe sex. Taking  low-dose aspirin every day. Taking vitamin and mineral supplements as recommended by your health care provider. What happens during an annual well check? The services and screenings done by your health care provider during your annual well check will depend on your age, overall health, lifestyle risk factors, and family history of disease. Counseling  Your health care provider may ask you questions about your: Alcohol use. Tobacco use. Drug use. Emotional well-being. Home and relationship well-being. Sexual activity. Eating habits. History of falls. Memory and ability to understand (cognition). Work and work Statistician. Reproductive health. Screening  You may have the following tests or measurements: Height, weight, and BMI. Blood pressure. Lipid and cholesterol levels. These may be checked every 5 years, or more frequently if you are over 50 years old. Skin check. Lung cancer screening. You may have this screening every year starting at age 72 if you have a 30-pack-year history of smoking and currently smoke or have quit within the past 15 years. Fecal occult blood test (FOBT) of the stool. You may have this test every year starting at age 74. Flexible sigmoidoscopy or colonoscopy. You may have a sigmoidoscopy every 5 years or a colonoscopy every 10 years starting at age 59. Hepatitis C blood test. Hepatitis B blood test. Sexually transmitted disease (STD) testing. Diabetes screening. This is done by checking your blood sugar (glucose) after you have not eaten for a while (fasting). You may have this done every 1-3 years. Bone density scan. This is done to screen for osteoporosis. You may have this done starting at age 50. Mammogram. This may be done every 1-2 years. Talk to your health care provider about how often you should have  regular mammograms. Talk with your health care provider about your test results, treatment options, and if necessary, the need for more tests. Vaccines   Your health care provider may recommend certain vaccines, such as: Influenza vaccine. This is recommended every year. Tetanus, diphtheria, and acellular pertussis (Tdap, Td) vaccine. You may need a Td booster every 10 years. Zoster vaccine. You may need this after age 26. Pneumococcal 13-valent conjugate (PCV13) vaccine. One dose is recommended after age 30. Pneumococcal polysaccharide (PPSV23) vaccine. One dose is recommended after age 3. Talk to your health care provider about which screenings and vaccines you need and how often you need them. This information is not intended to replace advice given to you by your health care provider. Make sure you discuss any questions you have with your health care provider. Document Released: 04/12/2015 Document Revised: 12/04/2015 Document Reviewed: 01/15/2015 Elsevier Interactive Patient Education  2017 [place] Prevention in the Home Falls can cause injuries. They can happen to people of all ages. There are many things you can do to make your home safe and to help prevent falls. What can I do on the outside of my home? Regularly fix the edges of walkways and driveways and fix any cracks. Remove anything that might make you trip as you walk through a door, such as a raised step or threshold. Trim any bushes or trees on the path to your home. Use bright outdoor lighting. Clear any walking paths of anything that might make someone trip, such as rocks or tools. Regularly check to see if handrails are loose or broken. Make sure that both sides of any steps have handrails. Any raised decks and porches should have guardrails on the edges. Have any leaves, snow, or ice cleared regularly. Use sand or salt on walking paths during winter. Clean up any spills in your garage right away. This includes oil or grease spills. What can I do in the bathroom? Use night lights. Install grab bars by the toilet and in the tub and shower. Do not use towel  bars as grab bars. Use non-skid mats or decals in the tub or shower. If you need to sit down in the shower, use a plastic, non-slip stool. Keep the floor dry. Clean up any water that spills on the floor as soon as it happens. Remove soap buildup in the tub or shower regularly. Attach bath mats securely with double-sided non-slip rug tape. Do not have throw rugs and other things on the floor that can make you trip. What can I do in the bedroom? Use night lights. Make sure that you have a light by your bed that is easy to reach. Do not use any sheets or blankets that are too big for your bed. They should not hang down onto the floor. Have a firm chair that has side arms. You can use this for support while you get dressed. Do not have throw rugs and other things on the floor that can make you trip. What can I do in the kitchen? Clean up any spills right away. Avoid walking on wet floors. Keep items that you use a lot in easy-to-reach places. If you need to reach something above you, use a strong step stool that has a grab bar. Keep electrical cords out of the way. Do not use floor polish or wax that makes floors slippery. If you must use wax, use non-skid floor wax. Do not have throw rugs and other things on the floor that  can make you trip. What can I do with my stairs? Do not leave any items on the stairs. Make sure that there are handrails on both sides of the stairs and use them. Fix handrails that are broken or loose. Make sure that handrails are as long as the stairways. Check any carpeting to make sure that it is firmly attached to the stairs. Fix any carpet that is loose or worn. Avoid having throw rugs at the top or bottom of the stairs. If you do have throw rugs, attach them to the floor with carpet tape. Make sure that you have a light switch at the top of the stairs and the bottom of the stairs. If you do not have them, ask someone to add them for you. What else can I do to help  prevent falls? Wear shoes that: Do not have high heels. Have rubber bottoms. Are comfortable and fit you well. Are closed at the toe. Do not wear sandals. If you use a stepladder: Make sure that it is fully opened. Do not climb a closed stepladder. Make sure that both sides of the stepladder are locked into place. Ask someone to hold it for you, if possible. Clearly mark and make sure that you can see: Any grab bars or handrails. First and last steps. Where the edge of each step is. Use tools that help you move around (mobility aids) if they are needed. These include: Canes. Walkers. Scooters. Crutches. Turn on the lights when you go into a dark area. Replace any light bulbs as soon as they burn out. Set up your furniture so you have a clear path. Avoid moving your furniture around. If any of your floors are uneven, fix them. If there are any pets around you, be aware of where they are. Review your medicines with your doctor. Some medicines can make you feel dizzy. This can increase your chance of falling. Ask your doctor what other things that you can do to help prevent falls. This information is not intended to replace advice given to you by your health care provider. Make sure you discuss any questions you have with your health care provider. Document Released: 01/10/2009 Document Revised: 08/22/2015 Document Reviewed: 04/20/2014 Elsevier Interactive Patient Education  2017 [employer].

## 2020-12-25 DIAGNOSIS — N2581 Secondary hyperparathyroidism of renal origin: Secondary | ICD-10-CM | POA: Diagnosis not present

## 2020-12-25 DIAGNOSIS — D631 Anemia in chronic kidney disease: Secondary | ICD-10-CM | POA: Diagnosis not present

## 2020-12-25 DIAGNOSIS — N184 Chronic kidney disease, stage 4 (severe): Secondary | ICD-10-CM | POA: Diagnosis not present

## 2020-12-25 DIAGNOSIS — I129 Hypertensive chronic kidney disease with stage 1 through stage 4 chronic kidney disease, or unspecified chronic kidney disease: Secondary | ICD-10-CM | POA: Diagnosis not present

## 2020-12-30 ENCOUNTER — Other Ambulatory Visit: Payer: Medicare PPO

## 2020-12-30 ENCOUNTER — Other Ambulatory Visit: Payer: Self-pay

## 2020-12-31 ENCOUNTER — Encounter: Payer: Self-pay | Admitting: Family Medicine

## 2020-12-31 ENCOUNTER — Ambulatory Visit (INDEPENDENT_AMBULATORY_CARE_PROVIDER_SITE_OTHER): Payer: Medicare PPO | Admitting: Family Medicine

## 2020-12-31 VITALS — BP 123/73 | HR 91 | Temp 97.1°F | Ht 62.0 in | Wt 104.8 lb

## 2020-12-31 DIAGNOSIS — Z0001 Encounter for general adult medical examination with abnormal findings: Secondary | ICD-10-CM

## 2020-12-31 DIAGNOSIS — I10 Essential (primary) hypertension: Secondary | ICD-10-CM | POA: Diagnosis not present

## 2020-12-31 DIAGNOSIS — N184 Chronic kidney disease, stage 4 (severe): Secondary | ICD-10-CM | POA: Diagnosis not present

## 2020-12-31 DIAGNOSIS — E782 Mixed hyperlipidemia: Secondary | ICD-10-CM

## 2020-12-31 DIAGNOSIS — D0512 Intraductal carcinoma in situ of left breast: Secondary | ICD-10-CM

## 2020-12-31 DIAGNOSIS — Z0000 Encounter for general adult medical examination without abnormal findings: Secondary | ICD-10-CM

## 2020-12-31 DIAGNOSIS — H0260 Xanthelasma of unspecified eye, unspecified eyelid: Secondary | ICD-10-CM

## 2020-12-31 MED ORDER — LEVOCETIRIZINE DIHYDROCHLORIDE 5 MG PO TABS: ORAL_TABLET | ORAL | 1 refills | Status: DC

## 2020-12-31 MED ORDER — ROSUVASTATIN CALCIUM 10 MG PO TABS: 10.0000 mg | ORAL_TABLET | Freq: Every day | ORAL | 3 refills | Status: DC

## 2020-12-31 MED ORDER — AMLODIPINE BESYLATE 10 MG PO TABS: 10.0000 mg | ORAL_TABLET | Freq: Every day | ORAL | 3 refills | Status: DC

## 2020-12-31 NOTE — Progress Notes (Signed)
Diane Carlson is a 75 y.o. female presents to office today for annual physical exam examination.    Concerns today include: 1. None  Occupation: retired  Diet: meat and potatoes, eats dairy and consumes water mostly. rare fruit/ vegetable, Exercise: daily walking 10k steps and 2 times per week silver sneakers Last eye exam: needs Last dental exam: n/a has false teeth Last colonoscopy: UTD Last mammogram: UTD Refills needed today: all Immunizations needed: Shingles, Tdap, Flu Immunization History  Administered Date(s) Administered   Fluad Quad(high Dose 65+) 12/27/2018, 01/23/2020   Influenza Whole 12/28/2009   Influenza, High Dose Seasonal PF 03/02/2017   Influenza,inj,Quad PF,6+ Mos 01/15/2014, 01/11/2015, 01/07/2016, 02/21/2018   Moderna Sars-Covid-2 Vaccination 05/02/2019, 05/30/2019, 02/28/2020, 09/11/2020   Pneumococcal Conjugate-13 08/31/2014   Pneumococcal Polysaccharide-23 08/25/2012   Tdap 09/04/2010   Zoster, Live 03/10/2013     Past Medical History:  Diagnosis Date   Allergy    Atypical pneumonia 09/25/2017   Cancer ([place])    breast cancer; dx in 04/2018.    Cholesteatoma    Complication of anesthesia    "slow to wake up, couldn't catch my breath"- has had anesthesia since this occurence and did fine.    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[employer]  Tobacco Use   Smoking status: Former    Packs/day: 1.00    Years: 20.00    Pack years: 20.00    Types: Cigarettes    Quit date: 08/25/2009    Years since quitting: 11.3   Smokeless tobacco: Never  Vaping Use   Vaping Use: Never used  Substance and Sexual Activity   Alcohol use: No   Drug use: No   Sexual activity: Not Currently  Other Topics Concern   Not on file  Social History Narrative   Lives alone   She exercises everyday and enjoys zumba   Social Determinants of Health   Financial Resource Strain: Low Risk    Difficulty of Paying Living Expenses: Not hard at all  Food Insecurity: No Food Insecurity   Worried About Charity fundraiser in the Last Year: Never true   [place] in the Last Year: Never true  Transportation Needs: No Transportation Needs   Lack of Transportation (Medical): No   Lack of Transportation (Non-Medical): No  Physical Activity: Sufficiently Active   Days of Exercise per Week: 5 days   Minutes of Exercise per Session: 120 min  Stress: No Stress Concern Present   Feeling of Stress : Not at all  Social Connections: Moderately Integrated   Frequency of Communication with Friends and Family: More than three times a week   Frequency of Social Gatherings with Friends and Family: More than three times a week  Attends Religious Services: More than 4 times per year   Active Member of Clubs or Organizations: Yes   Attends Archivist Meetings: More than 4 times per year   Marital Status: Widowed  Human resources officer Violence: Not At Risk   Fear of Current or Ex-Partner: No   Emotionally Abused: No   Physically Abused: No   Sexually Abused: No   Past Surgical History:  Procedure Laterality Date   ABDOMINAL HYSTERECTOMY     APPENDECTOMY     BREAST LUMPECTOMY Left 05/31/2018   BREAST LUMPECTOMY WITH RADIOACTIVE SEED LOCALIZATION Left 05/31/2018   Procedure: LEFT BREAST LUMPECTOMY WITH RADIOACTIVE SEED  LOCALIZATION;  Surgeon: Fanny Skates, MD;  Location: [facility];  Service: General;  Laterality: Left;   carotid endoectomy Left    cataract Bilateral 02/27/14   EYE SURGERY     catarat surgery bilaterally   Family History  Problem Relation Age of Onset   Heart disease Mother    Stroke Mother 25   Heart disease Father    Heart attack Father    Hypertension Son    Heart attack Paternal Aunt    Heart disease Paternal Aunt     Current Outpatient Medications:    albuterol (VENTOLIN HFA) 108 (90 Base) MCG/ACT inhaler, Inhale 2 puffs into the lungs every 6 (six) hours as needed for wheezing or shortness of breath., Disp: 8 g, Rfl: 2   amLODipine (NORVASC) 10 MG tablet, Take 1 tablet (10 mg total) by mouth daily., Disp: 90 tablet, Rfl: 3   anastrozole (ARIMIDEX) 1 MG tablet, Take 1 tablet (1 mg total) by mouth daily., Disp: 90 tablet, Rfl: 3   aspirin 81 MG tablet, Take 81 mg by mouth daily., Disp: , Rfl:    cholecalciferol (VITAMIN D) 1000 UNITS tablet, Take 1,000 Units by mouth daily., Disp: , Rfl:    fluticasone (FLONASE) 50 MCG/ACT nasal spray, Place 2 sprays into both nostrils daily., Disp: 16 g, Rfl: 6   Garlic 7035 MG CAPS, Take 1,000 mg by mouth 2 (two) times daily. , Disp: , Rfl:    levocetirizine (XYZAL) 5 MG tablet, TAKE 1 TABLET EVERY EVENING., Disp: 90 tablet, Rfl: 1   rosuvastatin (CRESTOR) 10 MG tablet, Take 1 tablet (10 mg total) by mouth daily., Disp: 90 tablet, Rfl: 3   Zinc Sulfate (ZINC 15 PO), Take by mouth., Disp: , Rfl:   Allergies  Allergen Reactions   Pravastatin     Muscle aches   Zetia [Ezetimibe]     mylagias   Cephalexin Rash    Inside mouth   Sulfa Antibiotics Rash    Including mouth     ROS: Review of Systems A comprehensive review of systems was negative except for: Hematologic/lymphatic: positive for spot on left cheek    Physical exam BP 123/73   Pulse 91   Temp (!) 97.1 F (36.2 C)   Ht 5\' 2"  (1.575 m)   Wt 104 lb 12.8 oz (47.5 kg)   SpO2  98%   BMI 19.17 kg/m  General appearance: alert, cooperative, appears stated age, and no distress Head: Normocephalic, without obvious abnormality, atraumatic Eyes: negative findings: lids and lashes normal, conjunctivae and sclerae normal, corneas clear, and pupils equal, round, reactive to light and accomodation Ears: normal TM's and external ear canals both ears Nose: Nares normal. Septum midline. Mucosa normal. No drainage or sinus tenderness. Throat:  Oropharynx without masses or erythema.  Has false teeth in place Neck: no adenopathy, no carotid bruit,  supple, symmetrical, trachea midline, and thyroid not enlarged, symmetric, no tenderness/mass/nodules Back: symmetric, no curvature. ROM normal. No CVA tenderness. Lungs: clear to auscultation bilaterally Heart: regular rate and rhythm, S1, S2 normal, no murmur, click, rub or gallop Abdomen: soft, non-tender; bowel sounds normal; no masses,  no organomegaly Extremities: extremities normal, atraumatic, no cyanosis or edema Pulses: 2+ and symmetric Skin:  Xanthelasma noted along the left cheek Lymph nodes: Cervical, supraclavicular, and axillary nodes normal. Neurologic: Grossly normal Psych: Mood stable, speech normal   Assessment/ Plan: Diane Carlson here for annual physical exam.   Annual physical exam  CKD (chronic kidney disease) stage 4, GFR 15-29 ml/min (HCC)  Essential hypertension, benign - Plan: amLODipine (NORVASC) 10 MG tablet  Mixed hyperlipidemia - Plan: rosuvastatin (CRESTOR) 10 MG tablet  Ductal carcinoma in situ (DCIS) of left breast  Xanthelasma  Up-to-date on preventative care.  Not due for next DEXA scan until 2023.  She will need influenza vaccination but awaiting this vaccination for a couple more weeks  Renal function panel showed stability of renal function but slight elevation in phosphorus last visit.  Continue to follow-up with renal for this.  I have adjusted her Xyzal to reflect renal dosing  of every 3 days.  She is on max renal dose of Crestor.  Asymptomatic from a breast cancer standpoint.  Continue Arimidex as directed  Lesion on lower eye/cheek seems consistent with a xanthelasma   Counseled on healthy lifestyle choices, including diet (rich in fruits, vegetables and lean meats and low in salt and simple carbohydrates) and exercise (at least 30 minutes of moderate physical activity daily).  Patient to follow up in 1 year for annual exam or sooner if needed.  Jaishaun Mcnab M. Lajuana Ripple, DO

## 2020-12-31 NOTE — Patient Instructions (Signed)
Preventive Care 40 Years and Older, Female Preventive care refers to lifestyle choices and visits with your health care provider that can promote health and wellness. This includes: A yearly physical exam. This is also called an annual wellness visit. Regular dental and eye exams. Immunizations. Screening for certain conditions. Healthy lifestyle choices, such as: Eating a healthy diet. Getting regular exercise. Not using drugs or products that contain nicotine and tobacco. Limiting alcohol use. What can I expect for my preventive care visit? Physical exam Your health care provider will check your: Height and weight. These may be used to calculate your BMI (body mass index). BMI is a measurement that tells if you are at a healthy weight. Heart rate and blood pressure. Body temperature. Skin for abnormal spots. Counseling Your health care provider may ask you questions about your: Past medical problems. Family's medical history. Alcohol, tobacco, and drug use. Emotional well-being. Home life and relationship well-being. Sexual activity. Diet, exercise, and sleep habits. History of falls. Memory and ability to understand (cognition). Work and work Statistician. Pregnancy and menstrual history. Access to firearms. What immunizations do I need? Vaccines are usually given at various ages, according to a schedule. Your health care provider will recommend vaccines for you based on your age, medical history, and lifestyle or other factors, such as travel or where you work. What tests do I need? Blood tests Lipid and cholesterol levels. These may be checked every 5 years, or more often depending on your overall health. Hepatitis C test. Hepatitis B test. Screening Lung cancer screening. You may have this screening every year starting at age 74 if you have a 30-pack-year history of smoking and currently smoke or have quit within the past 15 years. Colorectal cancer screening. All  adults should have this screening starting at age 74 and continuing until age 74. Your health care provider may recommend screening at age 31 if you are at increased risk. You will have tests every 1-10 years, depending on your results and the type of screening test. Diabetes screening. This is done by checking your blood sugar (glucose) after you have not eaten for a while (fasting). You may have this done every 1-3 years. Mammogram. This may be done every 1-2 years. Talk with your health care provider about how often you should have regular mammograms. Abdominal aortic aneurysm (AAA) screening. You may need this if you are a current or former smoker. BRCA-related cancer screening. This may be done if you have a family history of breast, ovarian, tubal, or peritoneal cancers. Other tests STD (sexually transmitted disease) testing, if you are at risk. Bone density scan. This is done to screen for osteoporosis. You may have this done starting at age 74. Talk with your health care provider about your test results, treatment options, and if necessary, the need for more tests. Follow these instructions at home: Eating and drinking  Eat a diet that includes fresh fruits and vegetables, whole grains, lean protein, and low-fat dairy products. Limit your intake of foods with high amounts of sugar, saturated fats, and salt. Take vitamin and mineral supplements as recommended by your health care provider. Do not drink alcohol if your health care provider tells you not to drink. If you drink alcohol: Limit how much you have to 0-1 drink a day. Be aware of how much alcohol is in your drink. In the [place] one drink equals one 12 oz bottle of beer (355 mL), one 5 oz glass of wine (148 mL), or one  1 oz glass of hard liquor (44 mL). Lifestyle Take daily care of your teeth and gums. Brush your teeth every morning and night with fluoride toothpaste. Floss one time each day. Stay active. Exercise for at least  30 minutes 5 or more days each week. Do not use any products that contain nicotine or tobacco, such as cigarettes, e-cigarettes, and chewing tobacco. If you need help quitting, ask your health care provider. Do not use drugs. If you are sexually active, practice safe sex. Use a condom or other form of protection in order to prevent STIs (sexually transmitted infections). Talk with your health care provider about taking a low-dose aspirin or statin. Find healthy ways to cope with stress, such as: Meditation, yoga, or listening to music. Journaling. Talking to a trusted person. Spending time with friends and family. Safety Always wear your seat belt while driving or riding in a vehicle. Do not drive: If you have been drinking alcohol. Do not ride with someone who has been drinking. When you are tired or distracted. While texting. Wear a helmet and other protective equipment during sports activities. If you have firearms in your house, make sure you follow all gun safety procedures. What's next? Visit your health care provider once a year for an annual wellness visit. Ask your health care provider how often you should have your eyes and teeth checked. Stay up to date on all vaccines. This information is not intended to replace advice given to you by your health care provider. Make sure you discuss any questions you have with your health care provider. Document Revised: 05/24/2020 Document Reviewed: 03/10/2018 Elsevier Patient Education  2022 [employer].

## 2021-01-24 ENCOUNTER — Ambulatory Visit (INDEPENDENT_AMBULATORY_CARE_PROVIDER_SITE_OTHER): Payer: Medicare PPO

## 2021-01-24 ENCOUNTER — Other Ambulatory Visit: Payer: Self-pay

## 2021-01-24 DIAGNOSIS — Z23 Encounter for immunization: Secondary | ICD-10-CM | POA: Diagnosis not present

## 2021-02-17 DIAGNOSIS — L720 Epidermal cyst: Secondary | ICD-10-CM | POA: Diagnosis not present

## 2021-04-03 ENCOUNTER — Other Ambulatory Visit: Payer: Self-pay | Admitting: Adult Health

## 2021-04-03 DIAGNOSIS — Z1231 Encounter for screening mammogram for malignant neoplasm of breast: Secondary | ICD-10-CM

## 2021-04-08 ENCOUNTER — Ambulatory Visit (INDEPENDENT_AMBULATORY_CARE_PROVIDER_SITE_OTHER): Payer: Medicare HMO | Admitting: *Deleted

## 2021-04-08 DIAGNOSIS — Z23 Encounter for immunization: Secondary | ICD-10-CM

## 2021-05-01 ENCOUNTER — Other Ambulatory Visit: Payer: Self-pay | Admitting: *Deleted

## 2021-05-01 ENCOUNTER — Telehealth: Payer: Self-pay | Admitting: Family Medicine

## 2021-05-01 MED ORDER — LEVOCETIRIZINE DIHYDROCHLORIDE 5 MG PO TABS: ORAL_TABLET | ORAL | 1 refills | Status: DC

## 2021-05-01 NOTE — Telephone Encounter (Signed)
States she has been taking this med everyday. Pharmacy told her it was sent in for every 3 days to take patient wants clarification if you want her to do every 3 days or everyday ? Please advise

## 2021-05-01 NOTE — Telephone Encounter (Signed)
Patient aware and verbalized understanding. °

## 2021-05-01 NOTE — Telephone Encounter (Signed)
Given her advanced kidney disease, the dosing should be only every 3 days for Xyzal.  This was a change made at her physical in October.

## 2021-05-19 ENCOUNTER — Ambulatory Visit
Admission: RE | Admit: 2021-05-19 | Discharge: 2021-05-19 | Disposition: A | Discharge status: Home / Self Care | Payer: Medicare HMO | Source: Ambulatory Visit | Attending: Adult Health | Admitting: Adult Health

## 2021-05-19 DIAGNOSIS — Z1231 Encounter for screening mammogram for malignant neoplasm of breast: Secondary | ICD-10-CM | POA: Diagnosis not present

## 2021-05-20 NOTE — Assessment & Plan Note (Signed)
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 Current treatment: Adjuvant anastrozole 1 mg dailystarted 06/2018  Anastrozole Toxicities: Occasional hot flashes  Breast cancer surveillance: 1.Breast exam 05/21/21: Benign 2.Mammogram 05/19/21: Benign breast density category B   Return to clinic in 1 year for follow-up

## 2021-05-20 NOTE — Progress Notes (Signed)
Patient Care Team: [name], DO as PCP - General (Family Medicine) Rexene Agent, MD as Attending Physician (Nephrology) Nicholas Lose, MD as Consulting Physician (Hematology and Oncology) Fanny Skates, MD as Consulting Physician (General Surgery) [facility]  DIAGNOSIS:    ICD-10-CM   1. 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 with above-mentioned history of left breast cancer treated with a lumpectomy and is currently on antiestrogen therapy with anastrozole. Mammogram on 05/20/2021 showed no evidence of malignancy bilaterally. She presents to the clinic today for follow-up.  She has noticed some muscle pain especially in the left calf after she walks for long period of time.  Denies any other joint stiffness or achiness.  ALLERGIES:  is allergic to pravastatin, zetia [ezetimibe], cephalexin, and sulfa antibiotics.  MEDICATIONS:  Current Outpatient Medications  Medication Sig Dispense Refill   albuterol (VENTOLIN HFA) 108 (90 Base) MCG/ACT inhaler Inhale 2 puffs into the lungs every 6 (six) hours as needed for wheezing or shortness of breath. 8 g 2   amLODipine (NORVASC) 10 MG tablet Take  1 tablet (10 mg total) by mouth daily. 90 tablet 3   anastrozole (ARIMIDEX) 1 MG tablet Take 1 tablet (1 mg total) by mouth daily. 90 tablet 3   aspirin 81 MG tablet Take 81 mg by mouth daily.     cholecalciferol (VITAMIN D) 1000 UNITS tablet Take 1,000 Units by mouth daily.     fluticasone (FLONASE) 50 MCG/ACT nasal spray Place 2 sprays into both nostrils daily. 16 g 6   Garlic 3825 MG CAPS Take 1,000 mg by mouth 2 (two) times daily.      levocetirizine (XYZAL) 5 MG tablet Take 1 tablet every THREE DAYS (renal dosing). Please note change! 90 tablet 1   rosuvastatin (CRESTOR) 10 MG tablet Take 1 tablet (10 mg total) by mouth daily. 90 tablet 3   Zinc Sulfate (ZINC 15 PO) Take by mouth.     No current facility-administered medications for this visit.    PHYSICAL EXAMINATION: ECOG PERFORMANCE STATUS: 1 - Symptomatic but completely ambulatory  Vitals:   05/21/21 0859  BP: 135/66  Pulse: (!) 103  Resp: 18  Temp: (!) 97.2 F (36.2 C)  SpO2: 97%   Filed Weights   05/21/21 0859  Weight: 104 lb (47.2 kg)    BREAST: No palpable masses or nodules in either right or left breasts. No palpable axillary supraclavicular or infraclavicular adenopathy no breast tenderness or nipple discharge. (exam performed in the presence of a chaperone)  LABORATORY DATA:  I have reviewed the data as listed CMP Latest Ref Rng & Units 11/29/2020 06/28/2020 12/29/2019  Glucose 65 -  99 mg/dL 103(H) 98 102(H)  BUN 8 - 27 mg/dL 26 26 27   Creatinine 0.57 - 1.00 mg/dL 2.58(H) 2.78(H) 2.32(H)  Sodium 134 - 144 mmol/L 140 144 144  Potassium 3.5 - 5.2 mmol/L 4.8 4.7 4.7  Chloride 96 - 106 mmol/L 101 104 105  CO2 20 - 29 mmol/L 24 21 24   Calcium 8.7 - 10.3 mg/dL 9.7 10.0 9.9  Total Protein 6.0 - 8.5 g/dL - - 7.6  Total Bilirubin 0.0 - 1.2 mg/dL - - 0.3  Alkaline Phos 44 - 121 IU/L - - 107  AST 0 - 40 IU/L - - 24  ALT 0 - 32 IU/L - - 15    Lab Results  Component Value Date   WBC 8.8 11/29/2020   HGB 11.4  11/29/2020   HCT 33.5 (L) 11/29/2020   MCV 89 11/29/2020   PLT 277 11/29/2020   NEUTROABS 5.4 06/28/2020    ASSESSMENT & PLAN:  Carcinoma of upper-inner quadrant of left breast in female, estrogen receptor positive ([place]) 05/31/2018:Left lumpectomy: 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 Current treatment: Adjuvant anastrozole 1 mg daily started 06/2018   Anastrozole Toxicities: Occasional hot flashes   Breast cancer surveillance: 1.  Breast exam 05/21/21: Benign 2.  Mammogram 05/19/21: Benign breast density category B    Return to clinic in 1 year for follow-up    No orders of the defined types were placed in this encounter.  The patient has a good understanding of the overall plan. she agrees with it. she will call with any problems that may develop before the next visit here.  Total time spent: 20 mins including face to face time and time spent for planning, charting and coordination of care  Rulon Eisenmenger, MD, MPH 05/21/2021  I, Thana Ates, am acting as scribe for Dr. Nicholas Lose.  I have reviewed the above documentation for accuracy and completeness, and I agree with the above.

## 2021-05-21 ENCOUNTER — Other Ambulatory Visit: Payer: Self-pay

## 2021-05-21 ENCOUNTER — Inpatient Hospital Stay: Payer: Medicare HMO | Attending: Hematology and Oncology | Admitting: Hematology and Oncology

## 2021-05-21 DIAGNOSIS — Z170 Estrogen receptor positive status [ER+]: Secondary | ICD-10-CM | POA: Diagnosis not present

## 2021-05-21 DIAGNOSIS — C50212 Malignant neoplasm of upper-inner quadrant of left female breast: Secondary | ICD-10-CM | POA: Insufficient documentation

## 2021-05-21 MED ORDER — ANASTROZOLE 1 MG PO TABS: 1.0000 mg | ORAL_TABLET | Freq: Every day | ORAL | 3 refills | Status: DC

## 2021-05-26 ENCOUNTER — Other Ambulatory Visit: Payer: Self-pay | Admitting: Hematology and Oncology

## 2021-06-02 ENCOUNTER — Other Ambulatory Visit: Payer: Medicare HMO

## 2021-06-02 DIAGNOSIS — N184 Chronic kidney disease, stage 4 (severe): Secondary | ICD-10-CM | POA: Diagnosis not present

## 2021-06-09 DIAGNOSIS — R809 Proteinuria, unspecified: Secondary | ICD-10-CM | POA: Diagnosis not present

## 2021-06-09 DIAGNOSIS — N2581 Secondary hyperparathyroidism of renal origin: Secondary | ICD-10-CM | POA: Diagnosis not present

## 2021-06-09 DIAGNOSIS — I129 Hypertensive chronic kidney disease with stage 1 through stage 4 chronic kidney disease, or unspecified chronic kidney disease: Secondary | ICD-10-CM | POA: Diagnosis not present

## 2021-06-09 DIAGNOSIS — D631 Anemia in chronic kidney disease: Secondary | ICD-10-CM | POA: Diagnosis not present

## 2021-06-09 DIAGNOSIS — N184 Chronic kidney disease, stage 4 (severe): Secondary | ICD-10-CM | POA: Diagnosis not present

## 2021-07-02 ENCOUNTER — Encounter: Payer: Self-pay | Admitting: Family Medicine

## 2021-07-02 ENCOUNTER — Ambulatory Visit (INDEPENDENT_AMBULATORY_CARE_PROVIDER_SITE_OTHER): Payer: Medicare HMO | Admitting: Family Medicine

## 2021-07-02 VITALS — BP 129/74 | HR 90 | Temp 98.3°F | Ht 62.0 in | Wt 103.4 lb

## 2021-07-02 DIAGNOSIS — M25562 Pain in left knee: Secondary | ICD-10-CM | POA: Diagnosis not present

## 2021-07-02 DIAGNOSIS — G8929 Other chronic pain: Secondary | ICD-10-CM | POA: Diagnosis not present

## 2021-07-02 DIAGNOSIS — Z23 Encounter for immunization: Secondary | ICD-10-CM | POA: Diagnosis not present

## 2021-07-02 DIAGNOSIS — N184 Chronic kidney disease, stage 4 (severe): Secondary | ICD-10-CM | POA: Diagnosis not present

## 2021-07-02 NOTE — Progress Notes (Addendum)
? ?Subjective: ?CC: Follow-up CKD ?PCP: Diane Norlander, DO ?EXH:Diane Carlson is a 76 y.o. female presenting to clinic today for: ? ?1.  CKD 4 ?Patient here for routine follow-up.  She is still very closely followed by Dr. Carmina Carlson who recently got labs on her.  Unfortunately her renal function has really not improved.  He recently started her on Jardiance 25 mg in efforts to improve renal function.  At this time she is tolerating it without difficulty.  Reports no orthostasis, vaginitis or other complications from the medication.  She does reports quite expensive however. ? ?2. Left lateral knee pain ?Patient reports chronic intermittently worse left lateral knee pain.  She is very physically active. The pain interferes with her ability to exercise.  She is willing to see someone now. ? ? ?ROS: Per HPI ? ?Allergies  ?Allergen Reactions  ? Pravastatin   ?  Muscle aches  ? Zetia [Ezetimibe]   ?  mylagias  ? Cephalexin Rash  ?  Inside mouth  ? Sulfa Antibiotics Rash  ?  Including mouth  ? ?Past Medical History:  ?Diagnosis Date  ? Allergy   ? Atypical pneumonia 09/25/2017  ? Cancer [facility])   ? breast cancer; dx in 04/2018.   ? Cholesteatoma   ? Complication of anesthesia   ? "slow to wake up, couldn't catch my breath"- has had anesthesia since this occurence and did fine.   ? 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 Essential hypertension, benign   ? FH: TAH-BSO (total abdominal hysterectomy and bilateral salpingo-oophorectomy)   ? Gout   ? Hx of appendectomy   ? Hyperlipidemia   ? Kidney disease   ? Osteopenia   ? Paget's disease   ? Pneumonia   ? Stroke [facility])   ? TIA; resulted in having a carotid endartectomy.   ? Vitamin D deficiency   ? ? ?Current Outpatient Medications:  ?  amLODipine (NORVASC) 10 MG tablet, Take 1 tablet (10 mg total) by mouth daily., Disp: 90 tablet, Rfl: 3 ?  anastrozole (ARIMIDEX) 1 MG tablet, TAKE 1 TABLET (1 MG TOTAL) BY MOUTH DAILY., Disp: 90 tablet, Rfl: 3 ?   aspirin 81 MG tablet, Take 81 mg by mouth daily., Disp: , Rfl:  ?  cholecalciferol (VITAMIN D) 1000 UNITS tablet, Take 1,000 Units by mouth daily., Disp: , Rfl:  ?  empagliflozin (JARDIANCE) 25 MG TABS tablet, Take by mouth daily., Disp: , Rfl:  ?  fluticasone (FLONASE) 50 MCG/ACT nasal spray, Place 2 sprays into both nostrils daily., Disp: 16 g, Rfl: 6 ?  Garlic 9678 MG CAPS, Take 1,000 mg by mouth 2 (two) times daily. , Disp: , Rfl:  ?  levocetirizine (XYZAL) 5 MG tablet, Take 1 tablet every THREE DAYS (renal dosing). Please note change!, Disp: 90 tablet, Rfl: 1 ?  rosuvastatin (CRESTOR) 10 MG tablet, Take 1 tablet (10 mg total) by mouth daily., Disp: 90 tablet, Rfl: 3 ?  Zinc Sulfate (ZINC 15 PO), Take by mouth., Disp: , Rfl:  ?  albuterol (VENTOLIN HFA) 108 (90 Base) MCG/ACT inhaler, Inhale 2 puffs into the lungs every 6 (six) hours as needed for wheezing or shortness of breath. (Patient not taking: Reported on 07/02/2021), Disp: 8 g, Rfl: 2 ?Social History  ? ?Socioeconomic History  ? Marital status: Widowed  ?  Spouse name: Not on file  ? Number of children: 2  ? Years of education: cosemtology certificate  ? Highest education level:  Some college, no degree  ?Occupational History  ? Occupation: Retired  ?  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 Smoking status: Former  ?  Packs/day: 1.00  ?  Years: 20.00  ?  Pack years: 20.00  ?  Types: Cigarettes  ?  Quit date: 08/25/2009  ?  Years since quitting: 11.8  ? Smokeless tobacco: Never  ?Vaping Use  ? Vaping Use: Never used  ?Substance and Sexual Activity  ? Alcohol use: No  ? Drug use: No  ? Sexual activity: Not Currently  ?Other Topics Concern  ? Not on file  ?Social History Narrative  ? Lives alone  ? She exercises everyday and enjoys zumba  ? ?Social Determinants of Health  ? ?Financial Resource Strain: Low Risk   ? Difficulty of Paying Living Expenses: Not hard at all  ?Food Insecurity: No Food Insecurity  ? Worried About Charity fundraiser in  the Last Year: Never true  ? Ran Out of Food in the Last Year: Never true  ?Transportation Needs: No Transportation Needs  ? Lack of Transportation (Medical): No  ? Lack of Transportation (Non-Medical): No  ?Physical Activity: Sufficiently Active  ? Days of Exercise per Week: 5 days  ? Minutes of Exercise per Session: 120 min  ?Stress: No Stress Concern Present  ? Feeling of Stress : Not at all  ?Social Connections: Moderately Integrated  ? Frequency of Communication with Friends and Family: More than three times a week  ? Frequency of Social Gatherings with Friends and Family: More than three times a week  ? Attends Religious Services: More than 4 times per year  ? Active Member of Clubs or Organizations: Yes  ? Attends Archivist Meetings: More than 4 times per year  ? Marital Status: Widowed  ?Intimate Partner Violence: Not At Risk  ? Fear of Current or Ex-Partner: No  ? Emotionally Abused: No  ? Physically Abused: No  ? Sexually Abused: No  ? ?Family History  ?Problem Relation Age of Onset  ? Heart disease Mother   ? Stroke Mother 32  ? Heart disease Father   ? Heart attack Father   ? Hypertension Son   ? Heart attack Paternal Aunt   ? Heart disease Paternal Aunt   ? ? ?Objective: ?Office vital signs reviewed. ?BP 129/74   Pulse 90   Temp 98.3 ?F (36.8 ?C)   Ht '5\' 2"'$  (1.575 m)   Wt 103 lb 6.4 oz (46.9 kg)   SpO2 96%   BMI 18.91 kg/m?  ? ?Physical Examination:  ?General: Awake, alert, thin, well-appearing elderly female, No acute distress ?HEENT:sclera white, MMM ?Cardio: regular rate and rhythm, S1S2 heard, no murmurs appreciated ?Pulm: clear to auscultation bilaterally, no wheezes, rhonchi or rales; normal work of breathing on room air ? ?Assessment/ Plan: ?76 y.o. female  ? ?CKD (chronic kidney disease) stage 4, GFR 15-29 ml/min (HCC) - Plan: CBC, Renal Function Panel, AMB Referral to [facility] ? ?Need for shingles vaccine ? ?Chronic pain of left knee - Plan: Ambulatory  referral to Sports Medicine ? ?Referral to Almyra Free, clinical pharmacist, for med assistance with Jardiance.  Renal function, CBC panel ordered ? ?Shingles vaccination #2 administered ? ?Referral to [facility] for lateral knee pain. ?u/s. Suspect tendinitis. ? ?No orders of the defined types were placed in this encounter. ? ?No orders of the defined types were placed in this encounter. ? ? ? ?Diane Norlander, DO ?[place] ?[phone]  [phone] ? ? ?

## 2021-07-03 LAB — RENAL FUNCTION PANEL
Albumin: 4.7 g/dL (ref 3.7–4.7)
BUN/Creatinine Ratio: 9 — ABNORMAL LOW (ref 12–28)
BUN: 25 mg/dL (ref 8–27)
CO2: 23 mmol/L (ref 20–29)
Calcium: 10.3 mg/dL (ref 8.7–10.3)
Chloride: 104 mmol/L (ref 96–106)
Creatinine, Ser: 2.71 mg/dL — ABNORMAL HIGH (ref 0.57–1.00)
Glucose: 103 mg/dL — ABNORMAL HIGH (ref 70–99)
Phosphorus: 4 mg/dL (ref 3.0–4.3)
Potassium: 5 mmol/L (ref 3.5–5.2)
Sodium: 144 mmol/L (ref 134–144)
eGFR: 18 mL/min/{1.73_m2} — ABNORMAL LOW (ref >59)

## 2021-07-03 LAB — CBC
Hematocrit: 37.3 % (ref 34.0–46.6)
Hemoglobin: 12.2 g/dL (ref 11.1–15.9)
MCH: 29.8 pg (ref 26.6–33.0)
MCHC: 32.7 g/dL (ref 31.5–35.7)
MCV: 91 fL (ref 79–97)
Platelets: 276 10*3/uL (ref 150–450)
RBC: 4.1 x10E6/uL (ref 3.77–5.28)
RDW: 13.3 % (ref 11.7–15.4)
WBC: 8.9 10*3/uL (ref 3.4–10.8)

## 2021-07-07 NOTE — Addendum Note (Signed)
Addended by: [name] on: 07/07/2021 02:04 PM ? ? Modules accepted: Orders ? ?

## 2021-07-14 DIAGNOSIS — N184 Chronic kidney disease, stage 4 (severe): Secondary | ICD-10-CM | POA: Diagnosis not present

## 2021-07-15 ENCOUNTER — Telehealth: Payer: Self-pay | Admitting: Family Medicine

## 2021-07-15 ENCOUNTER — Telehealth: Payer: Self-pay

## 2021-07-15 MED ORDER — ALBUTEROL SULFATE HFA 108 (90 BASE) MCG/ACT IN AERS
2.0000 | INHALATION_SPRAY | Freq: Four times a day (QID) | RESPIRATORY_TRACT | 2 refills | Status: AC | PRN
Start: 2021-07-15 — End: ?

## 2021-07-15 NOTE — Telephone Encounter (Signed)
?  Prescription Request ? ?07/15/2021 ? ?Is this a "Controlled Substance" medicine? albuterol (VENTOLIN HFA) 108 (90 Base) MCG/ACT inhaler ? ?Have you seen your PCP in the last 2 weeks? 04.05.2023 ? ?If YES, route message to pool  -  If NO, patient needs to be scheduled for appointment. ? ?What is the name of the medication or equipment? albuterol (VENTOLIN HFA) 108 (90 Base) MCG/ACT inhaler ? ?Have you contacted your pharmacy to request a refill? no  ? ?Which pharmacy would you like this sent to? [facility] ? ? ?Patient notified that their request is being sent to the clinical staff for review and that they should receive a response within 2 business days.  ? ? ?

## 2021-07-15 NOTE — Chronic Care Management (AMB) (Signed)
?  Care Management  ? ?Note ? ?07/15/2021 ?Name: Diane Carlson MRN: [id number] DOB: 04-13-1945 ? ?Diane Carlson is a 76 y.o. year old female who is a primary care patient of Raliegh Ip, DO. I reached out to [employer] by phone today offer care coordination services.  ? ?[name] was given information about care management services today including:  ?Care management services include personalized support from designated clinical staff supervised by her physician, including individualized plan of care and coordination with other care providers ?24/7 contact phone numbers for assistance for urgent and routine care needs. ?The patient may stop care management services at any time by phone call to the office staff. ? ?Patient agreed to services and verbal consent obtained.  ? ?Follow up plan: ?Telephone appointment with care management team member scheduled for:08/01/2021 ? ?Penne Lash, RMA ?Care Guide, Embedded Care Coordination ?[facility]  ?[place], [place] [address] ?Direct Dial: [phone] ?[email or web address] ?[email or web address]: [email or web address]  ? ?

## 2021-07-16 ENCOUNTER — Ambulatory Visit (HOSPITAL_COMMUNITY)
Admission: RE | Admit: 2021-07-16 | Discharge: 2021-07-16 | Disposition: A | Discharge status: Home / Self Care | Payer: Medicare HMO | Source: Ambulatory Visit | Attending: Sports Medicine | Admitting: Sports Medicine

## 2021-07-16 ENCOUNTER — Ambulatory Visit: Payer: Medicare HMO | Admitting: Sports Medicine

## 2021-07-16 VITALS — BP 126/68 | Ht 63.0 in | Wt 104.0 lb

## 2021-07-16 DIAGNOSIS — M79662 Pain in left lower leg: Secondary | ICD-10-CM

## 2021-07-16 NOTE — Patient Instructions (Signed)
It was great to meet you today, thank you for letting me participate in your care! ? ?Today, we discussed your left leg pain. We did also notice your right leg is shorter than the left and causing you to walk differently. There was also so hip weakness that I believe is contributing to your pain. ? ?Things for you to do: ? ?-Keep the heel left in the right shoe, you may place the other one in shoes that you walking ?-You may continue walking, although I will backed this off if is painful -you may use stationary bike or swimming as alternative exercises ?-Perform the hip strengthening exercises in the leg exercises at least once daily ?-You may use ice, topical medications or Tylenol for your pain ?-You will get an x-ray of the leg to make sure we do not see anything wrong with the bone.  I will call you if that shows anything abnormal. ? ?You will follow-up in 3 weeks if your pain is not better. ? ?If you have any further questions, please give the clinic a call [phone]. ? ?Elba Barman, DO ?[place] ? ?

## 2021-07-16 NOTE — Progress Notes (Signed)
PCP: [name], DO ? ?Subjective:  ? ?HPI: ?Diane Carlson is a pleasant 76 y.o. female here for left lateral leg pain. ? ?She has had left lateral fibular and calf pain for the last 3 weeks.  Patient is very active and does walk a few miles twice a day.  She denies any inciting injury or trauma that caused the pain.  She describes the pain as an aching pain.  Her pain is worse with walking, although she still continues to do this for exercise.  She does get some aching pain sometimes just with rest as well.  She denies any numbness or tingling. No redness, erythema or swelling.  She has been treating this with topical medications although has not taken anything orally.  She denies any history of issues with this leg.  She states about 40 years ago she did have a tendon rupture and repair surgery on the contralateral leg.  Based on her previous DEXA scan, she is osteopenic. She cannot take NSAID's secondary to CKD. ? ?DEXA scan 12/2019: ? ?Bone Mineral Density (BMD):  1.194 g/cm2 ?  ?Young Adult T-Score:  -0.2 ?  ?Z-Score:  2.2 ?  ?RIGHT FEMUR total ?  ?Bone Mineral Density (BMD):  0.746 g/cm2 ?  ?Young Adult T-Score: -2.1 ?  ?Z-Score:  0.0 ? ?Past Medical History:  ?Diagnosis Date  ? Allergy   ? Atypical pneumonia 09/25/2017  ? Cancer [facility])   ? breast cancer; dx in 04/2018.   ? Cholesteatoma   ? Complication of anesthesia   ? "slow to wake up, couldn't catch my breath"- has had anesthesia since this occurence and did fine.   ? 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 Essential hypertension, benign   ? FH: TAH-BSO (total abdominal hysterectomy and bilateral salpingo-oophorectomy)   ? Gout   ? Hx of appendectomy   ? Hyperlipidemia   ? Kidney disease   ? Osteopenia   ? Paget's disease   ? Pneumonia   ? Stroke [facility])   ? TIA; resulted in having a carotid endartectomy.   ? Vitamin D deficiency   ? ? ?Current Outpatient Medications on File Prior to Visit  ?Medication Sig Dispense Refill  ? albuterol  (VENTOLIN HFA) 108 (90 Base) MCG/ACT inhaler Inhale 2 puffs into the lungs every 6 (six) hours as needed for wheezing or shortness of breath. 8 g 2  ? amLODipine (NORVASC) 10 MG tablet Take 1 tablet (10 mg total) by mouth daily. 90 tablet 3  ? anastrozole (ARIMIDEX) 1 MG tablet TAKE 1 TABLET (1 MG TOTAL) BY MOUTH DAILY. 90 tablet 3  ? aspirin 81 MG tablet Take 81 mg by mouth daily.    ? cholecalciferol (VITAMIN D) 1000 UNITS tablet Take 1,000 Units by mouth daily.    ? empagliflozin (JARDIANCE) 25 MG TABS tablet Take by mouth daily.    ? fluticasone (FLONASE) 50 MCG/ACT nasal spray Place 2 sprays into both nostrils daily. 16 g 6  ? Garlic 9323 MG CAPS Take 1,000 mg by mouth 2 (two) times daily.     ? levocetirizine (XYZAL) 5 MG tablet Take 1 tablet every THREE DAYS (renal dosing). Please note change! 90 tablet 1  ? rosuvastatin (CRESTOR) 10 MG tablet Take 1 tablet (10 mg total) by mouth daily. 90 tablet 3  ? Zinc Sulfate (ZINC 15 PO) Take by mouth.    ? ?No current facility-administered medications on file prior to visit.  ? ? ?Past Surgical History:  ?  Procedure Laterality Date  ? ABDOMINAL HYSTERECTOMY    ? APPENDECTOMY    ? BREAST LUMPECTOMY Left 05/31/2018  ? BREAST LUMPECTOMY WITH RADIOACTIVE SEED LOCALIZATION Left 05/31/2018  ? Procedure: LEFT BREAST LUMPECTOMY WITH RADIOACTIVE SEED LOCALIZATION;  Surgeon: Fanny Skates, MD;  Location: [facility];  Service: General;  Laterality: Left;  ? carotid endoectomy Left   ? cataract Bilateral 02/27/14  ? EYE SURGERY    ? catarat surgery bilaterally  ? ? ?Allergies  ?Allergen Reactions  ? Pravastatin   ?  Muscle aches  ? Zetia [Ezetimibe]   ?  mylagias  ? Cephalexin Rash  ?  Inside mouth  ? Sulfa Antibiotics Rash  ?  Including mouth  ? ? ?There were no vitals taken for this visit. ? ?   ? View : No data to display.  ?  ?  ?  ? ? ?   ? View : No data to display.  ?  ?  ?  ? ? ?    ?Objective:  ?Physical Exam: ? ?Gen: Well-appearing, in no acute distress; non-toxic ?CV: Regular  Rate. Well-perfused. Warm.  ?Resp: Breathing unlabored on room air; no wheezing. ?Psych: Fluid speech in conversation; appropriate affect; normal thought process ?Neuro: Sensation intact throughout. No gross coordination deficits.  ?MSK:  ?- Left leg: Inspection of the knee and leg demonstrate no erythema, ecchymosis or effusion. + TTP near the proximal fibula and down the mid lateral fibular shaft.  Negative Tinel's sign at the fibular head.  Very mild pain with resisted plantarflexion.  She has full range of motion at the knee and ankle.  No ligamental instability.  The right leg is shortened by about 1/2 inch compared to the left.  Neurovascular intact distally. ? ?- Bilateral LE's: Good preservation of longitudinal arch.  She does walk with an antalgic gait. + Trendelenburg to the right.  The right leg is short by about 1/2 inch. + Notable weakness with hip abduction of the left. ?  ?Assessment & Plan:  ?1. Left lateral lower leg pain  ?2. Leg length discrepancy, right leg short ?3. Hip abduction weakness with trendelenburg ?4. Osteopenia  ? ?The patient has pain over the lateral fibular area. She does have leg length discrepancy and an antalgic Trendelenburg gait. Question if her pain is secondary to her gait abnormality.  Cannot rule out fibular stress fracture as well given her osteopenia.  Her gait was improved and less painful with the addition of the 5/16" heel lift on the right.  We will give her hip abduction exercises and peroneal home exercises for her to perform once-twice daily.  She will continue with her heel lift.  We will obtain x-ray of the tib-fib to rule out any bony pathology.  She may continue exercising, although discussed crosstraining and avoidance of heavy walking if this causes her pain. She will follow-up in 3 weeks. ? ?Elba Barman, DO ?PGY-4, Sports Medicine Fellow ?[place] ? ?This note was dictated using Dragon naturally speaking software and may contain  errors in syntax, spelling, or content which have not been identified prior to signing this note.  ? ?Addendum:  I was the preceptor for this visit and available for immediate consultation.  Karlton Lemon MD CAQSM ? ? ? ?

## 2021-07-28 ENCOUNTER — Encounter: Payer: Self-pay | Admitting: Nurse Practitioner

## 2021-07-28 ENCOUNTER — Ambulatory Visit (INDEPENDENT_AMBULATORY_CARE_PROVIDER_SITE_OTHER): Payer: Medicare HMO | Admitting: Nurse Practitioner

## 2021-07-28 VITALS — BP 116/69 | HR 104 | Temp 97.5°F | Resp 20 | Ht 63.0 in | Wt 103.0 lb

## 2021-07-28 DIAGNOSIS — J0110 Acute frontal sinusitis, unspecified: Secondary | ICD-10-CM

## 2021-07-28 MED ORDER — GUAIFENESIN ER 600 MG PO TB12: 600.0000 mg | ORAL_TABLET | Freq: Two times a day (BID) | ORAL | 0 refills | Status: DC

## 2021-07-28 MED ORDER — DOXYCYCLINE HYCLATE 100 MG PO TABS: 100.0000 mg | ORAL_TABLET | Freq: Two times a day (BID) | ORAL | 0 refills | Status: DC

## 2021-07-28 NOTE — Patient Instructions (Signed)

## 2021-07-28 NOTE — Progress Notes (Signed)
? ?Acute Office Visit ? ?Subjective:  ? ?  ?Patient ID: Diane Carlson, female    DOB: [birth year], 76 y.o.   MRN: [id number] ? ?Chief Complaint  ?Patient presents with  ? Allergies  ? ? ?Sinusitis ?This is a new problem. The current episode started yesterday. The problem has been gradually worsening since onset. There has been no fever. The pain is moderate. Associated symptoms include chills, coughing, ear pain and headaches. Pertinent negatives include no sore throat. Past treatments include nothing.  ?Cough ?This is a new problem. The current episode started in the past 7 days. The problem has been unchanged. The problem occurs constantly. The cough is Productive of sputum. Associated symptoms include chills, ear pain, headaches and nasal congestion. Pertinent negatives include no chest pain, fever, sore throat or wheezing. The symptoms are aggravated by pollens. She has tried nothing for the symptoms.  ? ? ?Review of Systems  ?Constitutional:  Positive for chills. Negative for fever.  ?HENT:  Positive for ear pain. Negative for sore throat.   ?Respiratory:  Positive for cough. Negative for wheezing.   ?Cardiovascular:  Negative for chest pain.  ?Musculoskeletal: Negative.   ?Neurological:  Positive for headaches.  ?All other systems reviewed and are negative. ? ? ?   ?Objective:  ?  ?BP 116/69   Pulse (!) 104   Temp (!) 97.5 ?F (36.4 ?C)   Resp 20   Ht '5\' 3"'$  (1.6 m)   Wt 103 lb (46.7 kg)   SpO2 97%   BMI 18.25 kg/m?  ?BP Readings from Last 3 Encounters:  ?07/28/21 116/69  ?07/16/21 126/68  ?07/02/21 129/74  ? ?Wt Readings from Last 3 Encounters:  ?07/28/21 103 lb (46.7 kg)  ?07/16/21 104 lb (47.2 kg)  ?07/02/21 103 lb 6.4 oz (46.9 kg)  ? ?  ? ?Physical Exam ?Vitals and nursing note reviewed.  ?Constitutional:   ?   Appearance: Normal appearance.  ?HENT:  ?   Head: Normocephalic.  ?   Right Ear: External ear normal.  ?   Left Ear: External ear normal.  ?   Nose: Congestion present.  ?   Mouth/Throat:  ?    Mouth: Mucous membranes are moist.  ?Eyes:  ?   Conjunctiva/sclera: Conjunctivae normal.  ?Cardiovascular:  ?   Rate and Rhythm: Normal rate and regular rhythm.  ?   Pulses: Normal pulses.  ?   Heart sounds: Normal heart sounds.  ?Pulmonary:  ?   Effort: Pulmonary effort is normal.  ?   Breath sounds: Normal breath sounds.  ?Abdominal:  ?   General: Bowel sounds are normal.  ?Skin: ?   General: Skin is warm.  ?   Findings: No rash.  ?Neurological:  ?   Mental Status: She is alert and oriented to person, place, and time.  ? ? ?No results found for any visits on 07/28/21. ? ? ?   ?Assessment & Plan:  ?Take meds as prescribed ?- Use a cool mist humidifier  ?-Use saline nose sprays frequently ?-Force fluids ?-For fever or aches or pains- take Tylenol or ibuprofen. ?-If symptoms do not improve, she may need to be COVID tested to rule this out ?Follow up with worsening unresolved symptoms  ?Problem List Items Addressed This Visit   ?None ?Visit Diagnoses   ? ? Acute non-recurrent frontal sinusitis    -  Primary  ? Relevant Medications  ? doxycycline (VIBRA-TABS) 100 MG tablet  ? guaiFENesin (MUCINEX) 600 MG 12 hr tablet  ? ?  ? ? ?  Meds ordered this encounter  ?Medications  ? doxycycline (VIBRA-TABS) 100 MG tablet  ?  Sig: Take 1 tablet (100 mg total) by mouth 2 (two) times daily.  ?  Dispense:  14 tablet  ?  Refill:  0  ?  Order Specific Question:   Supervising Provider  ?  AnswerClaretta Fraise [[id number]]  ? guaiFENesin (MUCINEX) 600 MG 12 hr tablet  ?  Sig: Take 1 tablet (600 mg total) by mouth 2 (two) times daily.  ?  Dispense:  30 tablet  ?  Refill:  0  ?  Order Specific Question:   Supervising Provider  ?  AnswerClaretta Fraise [[id number]]  ? ? ?Return if symptoms worsen or fail to improve. ? ?Diane Lynn, NP ? ? ?

## 2021-08-01 ENCOUNTER — Ambulatory Visit (INDEPENDENT_AMBULATORY_CARE_PROVIDER_SITE_OTHER): Payer: Medicare HMO | Admitting: Pharmacist

## 2021-08-01 DIAGNOSIS — N184 Chronic kidney disease, stage 4 (severe): Secondary | ICD-10-CM

## 2021-08-01 DIAGNOSIS — E782 Mixed hyperlipidemia: Secondary | ICD-10-CM

## 2021-08-01 NOTE — Patient Instructions (Addendum)
Visit Information ? ?Following are the goals we discussed today:  ?Current Barriers:  ?Unable to independently afford treatment regimen ?Unable to achieve control of CKD4, HLD  ? ?Pharmacist Clinical Goal(s):  ?patient will verbalize ability to afford treatment regimen ?achieve control of CKD4, HLD as evidenced by GOAL LABS through collaboration with PharmD and provider.  ? ?Interventions: ?1:1 collaboration with [name], DO regarding development and update of comprehensive plan of care as evidenced by provider attestation and co-signature ?Inter-disciplinary care team collaboration (see longitudinal plan of care) ?Comprehensive medication review performed; medication list updated in electronic medical record ?Chronic Kidney Disease: new goal ?CKD (chronic kidney disease) stage 4, GFR 15-29 ml/min  ?Patient sees Dr. Joelyn Oms at [facility] ?He started jardiance, would prefer farxiga given evidence  (unable to see his note) ?Patient can't afford >$140 on insurance ?Dietary handout provided for patient on kidney dietary recommendations ? ? ?Hyperlipidemia:  New goal. ?controlled; current treatment: rosuvastatin '10mg'$  (appropriately dose per renal);  ?Current dietary patterns: FOLLOWING A HEART HEALTHY DIET/HEALTHY PLATE METHOD ?Recommended repeat labs, continue current therapy ?Lipid Panel  ?   ?Component Value Date/Time  ? CHOL 134 12/29/2019 0816  ? TRIG 118 12/29/2019 0816  ? TRIG 116 12/21/2013 0850  ? HDL 59 12/29/2019 0816  ? HDL 57 12/21/2013 0850  ? CHOLHDL 2.3 12/29/2019 0816  ? CHOLHDL 6.9 08/18/2012 0850  ? VLDL 42 (H) 08/18/2012 0850  ? LDLCALC 54 12/29/2019 0816  ? [address] 48 12/21/2013 0850  ? LABVLDL 21 12/29/2019 0816  ? ? ? ?Patient Goals/Self-Care Activities ?patient will:  ?- take medications as prescribed as evidenced by patient report and record review ?collaborate with provider on medication access solutions ?target a minimum of 150 minutes of moderate intensity exercise  weekly ?engage in dietary modifications by FOLLOWING A HEART HEALTHY DIET/HEALTHY PLATE METHOD, renal diet ? ? ? ?Plan: Telephone follow up appointment with care management team member scheduled for:  3 months ? ?Signature ?Regina Eck, PharmD, BCPS ?Clinical Pharmacist, [facility] ?[place]  II Phone [phone] ? ? ?Please call the care guide team at [phone] if you need to cancel or reschedule your appointment.  ? ?The patient verbalized understanding of instructions, educational materials, and care plan provided today and declined offer to receive copy of patient instructions, educational materials, and care plan.  ? ?

## 2021-08-01 NOTE — Progress Notes (Signed)
? ? ?Chronic Care Management ?Pharmacy Note ? ?08/01/2021 ?Name:  Diane Carlson MRN:  [id number] DOB:  September 27, 1945 ? ?Summary: ? ?Chronic Kidney Disease: new goal ?CKD (chronic kidney disease) stage 4, GFR 15-29 ml/min  ?Patient sees Dr. Joelyn Oms at [facility] ?He started jardiance, would prefer farxiga given evidence  (unable to see his note) ?Patient can't afford >$140 on insurance ?Dietary handout provided for patient on kidney dietary recommendations ? ? ?Hyperlipidemia:  New goal. ?controlled; current treatment: rosuvastatin '10mg'$  (appropriately dose per renal);  ?Current dietary patterns: FOLLOWING A HEART HEALTHY DIET/HEALTHY PLATE METHOD ?Recommended repeat labs, continue current therapy ?Lipid Panel  ?   ?Component Value Date/Time  ? CHOL 134 12/29/2019 0816  ? TRIG 118 12/29/2019 0816  ? TRIG 116 12/21/2013 0850  ? HDL 59 12/29/2019 0816  ? HDL 57 12/21/2013 0850  ? CHOLHDL 2.3 12/29/2019 0816  ? CHOLHDL 6.9 08/18/2012 0850  ? VLDL 42 (H) 08/18/2012 0850  ? LDLCALC 54 12/29/2019 0816  ? [place] 48 12/21/2013 0850  ? LABVLDL 21 12/29/2019 0816  ? ? ?Subjective: ?Diane Carlson is an 76 y.o. year old female who is a primary patient of [name], DO.  The CCM team was consulted for assistance with disease management and care coordination needs.   ? ?Engaged with patient by telephone for initial visit in response to provider referral for pharmacy case management and/or care coordination services.  ? ?Consent to Services:  ?The patient was given information about Chronic Care Management services, agreed to services, and gave verbal consent prior to initiation of services.  Please see initial visit note for detailed documentation.  ? ?Patient Care Team: ?[name], DO as PCP - General (Family Medicine) ?Rexene Agent, MD as Attending Physician (Nephrology) ?Nicholas Lose, MD as Consulting Physician (Hematology and Oncology) ?Fanny Skates, MD as Consulting Physician (General  Surgery) ?[employer] ?Blanca Friend, Royce Macadamia, [facility] as [facility] (Pharmacist) ? ? ?Objective: ? ?Lab Results  ?Component Value Date  ? CREATININE 2.71 (H) 07/02/2021  ? CREATININE 2.58 (H) 11/29/2020  ? CREATININE 2.78 (H) 06/28/2020  ? ? ?No results found for: HGBA1C ?Last diabetic Eye exam: No results found for: HMDIABEYEEXA  ?Last diabetic Foot exam: No results found for: HMDIABFOOTEX  ? ?   ?Component Value Date/Time  ? CHOL 134 12/29/2019 0816  ? TRIG 118 12/29/2019 0816  ? TRIG 116 12/21/2013 0850  ? HDL 59 12/29/2019 0816  ? HDL 57 12/21/2013 0850  ? CHOLHDL 2.3 12/29/2019 0816  ? CHOLHDL 6.9 08/18/2012 0850  ? VLDL 42 (H) 08/18/2012 0850  ? LDLCALC 54 12/29/2019 0816  ? [place] 48 12/21/2013 0850  ? ? ? ?  Latest Ref Rng & Units 07/02/2021  ?  8:19 AM 11/29/2020  ? 11:36 AM 06/28/2020  ?  8:03 AM  ?Hepatic Function  ?Albumin 3.7 - 4.7 g/dL 4.7   4.6   4.6    ? ? ?Lab Results  ?Component Value Date/Time  ? TSH 1.210 08/31/2014 09:33 AM  ? ? ? ?  Latest Ref Rng & Units 07/02/2021  ?  8:19 AM 11/29/2020  ? 11:36 AM 06/28/2020  ?  8:03 AM  ?CBC  ?WBC 3.4 - 10.8 x10E3/uL 8.9   8.8   8.9    ?Hemoglobin 11.1 - 15.9 g/dL 12.2   11.4   11.7    ?Hematocrit 34.0 - 46.6 % 37.3   33.5   34.6    ?  Platelets 150 - 450 x10E3/uL 276   277   312    ? ? ?Lab Results  ?Component Value Date/Time  ? VD25OH 45.9 11/29/2020 11:36 AM  ? VD25OH 38.4 12/29/2019 08:16 AM  ? ? ?Clinical ASCVD: No  ?The 10-year ASCVD risk score (Arnett DK, et al., 2019) is: 15.1% ?  Values used to calculate the score: ?    Age: 76 years ?    Sex: Female ?    Is Non-Hispanic African American: No ?    Diabetic: No ?    Tobacco smoker: No ?    Systolic Blood Pressure: 782 mmHg ?    Is BP treated: Yes ?    HDL Cholesterol: 59 mg/dL ?    Total Cholesterol: 134 mg/dL   ? ?Other: (CHADS2VASc if Afib, PHQ9 if depression, MMRC or CAT for COPD, ACT, DEXA) ? ?Social History  ? ?Tobacco Use  ?Smoking Status Former  ? Packs/day: 1.00  ?  Years: 20.00  ? Pack years: 20.00  ? Types: Cigarettes  ? Quit date: 08/25/2009  ? Years since quitting: 11.9  ?Smokeless Tobacco Never  ? ?BP Readings from Last 3 Encounters:  ?07/28/21 116/69  ?07/16/21 126/68  ?07/02/21 129/74  ? ?Pulse Readings from Last 3 Encounters:  ?07/28/21 (!) 104  ?07/02/21 90  ?05/21/21 (!) 103  ? ?Wt Readings from Last 3 Encounters:  ?07/28/21 103 lb (46.7 kg)  ?07/16/21 104 lb (47.2 kg)  ?07/02/21 103 lb 6.4 oz (46.9 kg)  ? ? ?Assessment: Review of patient past medical history, allergies, medications, health status, including review of consultants reports, laboratory and other test data, was performed as part of comprehensive evaluation and provision of chronic care management services.  ? ?SDOH:  (Social Determinants of Health) assessments and interventions performed:  ? ? ?CCM Care Plan ? ?Allergies  ?Allergen Reactions  ? Pravastatin   ?  Muscle aches  ? Zetia [Ezetimibe]   ?  mylagias  ? Cephalexin Rash  ?  Inside mouth  ? Sulfa Antibiotics Rash  ?  Including mouth  ? ? ?Medications Reviewed Today   ? ? Reviewed by Lavera Guise, [facility] (Pharmacist) on 08/01/21 at [place] List Status: <None>  ? ?Medication Order Taking? Sig Documenting Provider Last Dose Status Informant  ?albuterol (VENTOLIN HFA) 108 (90 Base) MCG/ACT inhaler [id number] No Inhale 2 puffs into the lungs every 6 (six) hours as needed for wheezing or shortness of breath. Ronnie Doss M, DO Taking Active   ?amLODipine (NORVASC) 10 MG tablet [id number] No Take 1 tablet (10 mg total) by mouth daily. Ronnie Doss M, DO Taking Active   ?anastrozole (ARIMIDEX) 1 MG tablet [id number] No TAKE 1 TABLET (1 MG TOTAL) BY MOUTH DAILY. Nicholas Lose, MD Taking Active   ?aspirin 81 MG tablet [id number] No Take 81 mg by mouth daily. [provider] Taking Active Self  ?cholecalciferol (VITAMIN D) 1000 UNITS tablet [id number] No Take 1,000 Units by mouth daily. [provider] Taking Active Self  ?doxycycline  (VIBRA-TABS) 100 MG tablet [id number]  Take 1 tablet (100 mg total) by mouth 2 (two) times daily. [name], NP  Active   ?empagliflozin (JARDIANCE) 25 MG TABS tablet [id number] No Take by mouth daily. [provider] Taking Active   ?fluticasone (FLONASE) 50 MCG/ACT nasal spray [id number] No Place 2 sprays into both nostrils daily. Baruch Gouty, FNP Taking Active   ?Garlic 1601 MG CAPS [id number] No Take 1,000 mg by mouth 2 (two)  times daily.  [provider] Taking Active Self  ?guaiFENesin (MUCINEX) 600 MG 12 hr tablet [id number]  Take 1 tablet (600 mg total) by mouth 2 (two) times daily. [name], NP  Active   ?rosuvastatin (CRESTOR) 10 MG tablet [id number] No Take 1 tablet (10 mg total) by mouth daily. [name], DO Taking Active   ?Zinc Sulfate (ZINC 15 PO) [id number] No Take by mouth. [provider] Taking Active   ? ?  ?  ? ?  ? ? ?Patient Active Problem List  ? Diagnosis Date Noted  ? COPD, severe ([place]) 12/28/2019  ? Carcinoma of upper-inner quadrant of left breast in female, estrogen receptor positive ([place]) 06/17/2018  ? 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 Breast mass in female 05/23/2018  ? Vitamin D deficiency   ? Seasonal allergic rhinitis 11/29/2012  ? Allergies   ? Anemia of chronic disease 08/25/2012  ? FH: TAH-BSO (total abdominal hysterectomy and bilateral salpingo-oophorectomy)   ? Paget disease of bone   ? Osteopenia   ? Essential hypertension, benign   ? Hyperlipidemia   ? CKD (chronic kidney disease) stage 4, GFR 15-29 ml/min (HCC)   ? Gout   ? Cholesteatoma   ? ? ?Immunization History  ?Administered Date(s) Administered  ? Fluad Quad(high Dose 65+) 12/27/2018, 01/23/2020, 01/24/2021  ? Influenza Whole 12/28/2009  ? Influenza, High Dose Seasonal PF 03/02/2017  ? Influenza,inj,Quad PF,6+ Mos 01/15/2014, 01/11/2015, 01/07/2016, 02/21/2018  ? Moderna Sars-Covid-2 Vaccination 05/02/2019, 05/30/2019, 02/28/2020, 09/11/2020  ?  Pneumococcal Conjugate-13 08/31/2014  ? Pneumococcal Polysaccharide-23 08/25/2012  ? Tdap 09/04/2010  ? Zoster Recombinat (Shingrix) 04/08/2021, 07/02/2021  ? Zoster, Live 03/10/2013  ? ? ?Conditions to be addres

## 2021-08-06 ENCOUNTER — Ambulatory Visit: Payer: Medicare HMO | Admitting: Sports Medicine

## 2021-08-06 ENCOUNTER — Ambulatory Visit (INDEPENDENT_AMBULATORY_CARE_PROVIDER_SITE_OTHER): Payer: Medicare HMO | Admitting: Family Medicine

## 2021-08-06 ENCOUNTER — Encounter: Payer: Self-pay | Admitting: Family Medicine

## 2021-08-06 VITALS — BP 125/58 | HR 97 | Temp 98.1°F | Ht 63.0 in | Wt 102.0 lb

## 2021-08-06 DIAGNOSIS — J0141 Acute recurrent pansinusitis: Secondary | ICD-10-CM

## 2021-08-06 MED ORDER — AMOXICILLIN-POT CLAVULANATE 875-125 MG PO TABS: 1.0000 | ORAL_TABLET | Freq: Two times a day (BID) | ORAL | 0 refills | Status: AC

## 2021-08-06 NOTE — Progress Notes (Signed)
?  ? ?Subjective:  ?Patient ID: Diane Carlson, female    DOB: Oct 12, 1945, 76 y.o.   MRN: [id number] ? ?Patient Care Team: ?[name], DO as PCP - General (Family Medicine) ?Rexene Agent, MD as Attending Physician (Nephrology) ?Nicholas Lose, MD as Consulting Physician (Hematology and Oncology) ?Fanny Skates, MD as Consulting Physician (General Surgery) ?[employer] ?Blanca Friend, Royce Macadamia, [facility] as [facility] (Pharmacist)  ? ?Chief Complaint:  Sinus Problem ? ? ?HPI: ?Diane Carlson is a 76 y.o. female presenting on 08/06/2021 for Sinus Problem ? ? ?Sinus Problem ?This is a recurrent problem. The current episode started 1 to 4 weeks ago. The problem has been gradually worsening since onset. Her pain is at a severity of 6/10. Associated symptoms include chills, headaches and sinus pressure. Pertinent negatives include no congestion, coughing, diaphoresis, ear pain, hoarse voice, neck pain, shortness of breath, sneezing, sore throat or swollen glands. Past treatments include antibiotics and spray decongestants (treated with doxycycline on 07/28/2021, unable to tolerate due to upset stomach). The treatment provided no relief.  ? ? ?Relevant past medical, surgical, family, and social history reviewed and updated as indicated.  ?Allergies and medications reviewed and updated. Data reviewed: Chart in Epic. ? ? ?Past Medical History:  ?Diagnosis Date  ? Allergy   ? Atypical pneumonia 09/25/2017  ? Cancer [facility])   ? breast cancer; dx in 04/2018.   ? Cholesteatoma   ? Complication of anesthesia   ? "slow to wake up, couldn't catch my breath"- has had anesthesia since this occurence and did fine.   ? 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 Essential hypertension, benign   ? FH: TAH-BSO (total abdominal hysterectomy and bilateral salpingo-oophorectomy)   ? Gout   ? Hx of appendectomy   ? Hyperlipidemia   ? Kidney disease   ? Osteopenia   ? Paget's  disease   ? Pneumonia   ? Stroke [facility])   ? TIA; resulted in having a carotid endartectomy.   ? Vitamin D deficiency   ? ? ?Past Surgical History:  ?Procedure Laterality Date  ? ABDOMINAL HYSTERECTOMY    ? APPENDECTOMY    ? BREAST LUMPECTOMY Left 05/31/2018  ? BREAST LUMPECTOMY WITH RADIOACTIVE SEED LOCALIZATION Left 05/31/2018  ? Procedure: LEFT BREAST LUMPECTOMY WITH RADIOACTIVE SEED LOCALIZATION;  Surgeon: Fanny Skates, MD;  Location: [facility];  Service: General;  Laterality: Left;  ? carotid endoectomy Left   ? cataract Bilateral 02/27/14  ? EYE SURGERY    ? catarat surgery bilaterally  ? ? ?Social History  ? ?Socioeconomic History  ? Marital status: Widowed  ?  Spouse name: Not on file  ? Number of children: 2  ? Years of education: cosemtology certificate  ? Highest education level: Some college, no degree  ?Occupational History  ? Occupation: Retired  ?  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 Smoking status: Former  ?  Packs/day: 1.00  ?  Years: 20.00  ?  Pack years: 20.00  ?  Types: Cigarettes  ?  Quit date: 08/25/2009  ?  Years since quitting: 11.9  ? Smokeless tobacco: Never  ?Vaping Use  ? Vaping Use: Never used  ?Substance and Sexual Activity  ? Alcohol use: No  ? Drug use: No  ? Sexual activity: Not Currently  ?Other Topics Concern  ? Not on file  ?Social History Narrative  ? Lives alone  ? She exercises everyday and  enjoys zumba  ? ?Social Determinants of Health  ? ?Financial Resource Strain: Low Risk   ? Difficulty of Paying Living Expenses: Not hard at all  ?Food Insecurity: No Food Insecurity  ? Worried About Charity fundraiser in the Last Year: Never true  ? Ran Out of Food in the Last Year: Never true  ?Transportation Needs: No Transportation Needs  ? Lack of Transportation (Medical): No  ? Lack of Transportation (Non-Medical): No  ?Physical Activity: Sufficiently Active  ? Days of Exercise per Week: 5 days  ? Minutes of Exercise per Session: 120 min  ?Stress: No Stress Concern  Present  ? Feeling of Stress : Not at all  ?Social Connections: Moderately Integrated  ? Frequency of Communication with Friends and Family: More than three times a week  ? Frequency of Social Gatherings with Friends and Family: More than three times a week  ? Attends Religious Services: More than 4 times per year  ? Active Member of Clubs or Organizations: Yes  ? Attends Archivist Meetings: More than 4 times per year  ? Marital Status: Widowed  ?Intimate Partner Violence: Not At Risk  ? Fear of Current or Ex-Partner: No  ? Emotionally Abused: No  ? Physically Abused: No  ? Sexually Abused: No  ? ? ?Outpatient Encounter Medications as of 08/06/2021  ?Medication Sig  ? albuterol (VENTOLIN HFA) 108 (90 Base) MCG/ACT inhaler Inhale 2 puffs into the lungs every 6 (six) hours as needed for wheezing or shortness of breath.  ? amLODipine (NORVASC) 10 MG tablet Take 1 tablet (10 mg total) by mouth daily.  ? amoxicillin-clavulanate (AUGMENTIN) 875-125 MG tablet Take 1 tablet by mouth 2 (two) times daily for 10 days.  ? anastrozole (ARIMIDEX) 1 MG tablet TAKE 1 TABLET (1 MG TOTAL) BY MOUTH DAILY.  ? aspirin 81 MG tablet Take 81 mg by mouth daily.  ? cholecalciferol (VITAMIN D) 1000 UNITS tablet Take 1,000 Units by mouth daily.  ? empagliflozin (JARDIANCE) 25 MG TABS tablet Take by mouth daily.  ? fluticasone (FLONASE) 50 MCG/ACT nasal spray Place 2 sprays into both nostrils daily.  ? Garlic 6789 MG CAPS Take 1,000 mg by mouth 2 (two) times daily.   ? guaiFENesin (MUCINEX) 600 MG 12 hr tablet Take 1 tablet (600 mg total) by mouth 2 (two) times daily.  ? rosuvastatin (CRESTOR) 10 MG tablet Take 1 tablet (10 mg total) by mouth daily.  ? Zinc Sulfate (ZINC 15 PO) Take by mouth.  ? doxycycline (VIBRA-TABS) 100 MG tablet Take 1 tablet (100 mg total) by mouth 2 (two) times daily. (Patient not taking: Reported on 08/06/2021)  ? ?No facility-administered encounter medications on file as of 08/06/2021.  ? ? ?Allergies   ?Allergen Reactions  ? Pravastatin   ?  Muscle aches  ? Zetia [Ezetimibe]   ?  mylagias  ? Cephalexin Rash  ?  Inside mouth  ? Sulfa Antibiotics Rash  ?  Including mouth  ? ? ?Review of Systems  ?Constitutional:  Positive for chills. Negative for activity change, appetite change, diaphoresis, fatigue, fever and unexpected weight change.  ?HENT:  Positive for postnasal drip, rhinorrhea, sinus pressure and sinus pain. Negative for congestion, dental problem, drooling, ear discharge, ear pain, facial swelling, hearing loss, hoarse voice, mouth sores, nosebleeds, sneezing, sore throat, tinnitus, trouble swallowing and voice change.   ?Eyes: Negative.  Negative for visual disturbance.  ?Respiratory:  Negative for cough, chest tightness and shortness of breath.   ?Cardiovascular:  Negative for chest pain, palpitations and leg swelling.  ?Gastrointestinal:  Negative for abdominal pain, blood in stool, constipation, diarrhea, nausea and vomiting.  ?Endocrine: Negative.   ?Genitourinary:  Negative for decreased urine volume, difficulty urinating, dysuria, frequency and urgency.  ?Musculoskeletal:  Negative for arthralgias, myalgias and neck pain.  ?Skin: Negative.   ?Allergic/Immunologic: Negative.   ?Neurological:  Positive for headaches. Negative for dizziness and weakness.  ?Hematological: Negative.   ?Psychiatric/Behavioral:  Negative for confusion, hallucinations, sleep disturbance and suicidal ideas.   ?All other systems reviewed and are negative. ? ?   ? ?Objective:  ?BP (!) 125/58   Pulse 97   Temp 98.1 ?F (36.7 ?C)   Ht '5\' 3"'$  (1.6 m)   Wt 102 lb (46.3 kg)   SpO2 96%   BMI 18.07 kg/m?   ? ?Wt Readings from Last 3 Encounters:  ?08/06/21 102 lb (46.3 kg)  ?07/28/21 103 lb (46.7 kg)  ?07/16/21 104 lb (47.2 kg)  ? ? ?Physical Exam ?Vitals and nursing note reviewed.  ?Constitutional:   ?   General: She is not in acute distress. ?   Appearance: Normal appearance. She is normal weight. She is ill-appearing. She is  not toxic-appearing or diaphoretic.  ?HENT:  ?   Head: Normocephalic and atraumatic.  ?   Right Ear: Ear canal and external ear normal. A middle ear effusion is present. Tympanic membrane is not erythematous.  ?   Left Ear:

## 2021-08-15 ENCOUNTER — Telehealth: Payer: Self-pay | Admitting: Family Medicine

## 2021-08-15 NOTE — Telephone Encounter (Signed)
Patient aware and verbalizes understanding. 

## 2021-08-21 ENCOUNTER — Telehealth: Payer: Self-pay | Admitting: Family Medicine

## 2021-08-21 DIAGNOSIS — M79605 Pain in left leg: Secondary | ICD-10-CM

## 2021-08-21 DIAGNOSIS — N184 Chronic kidney disease, stage 4 (severe): Secondary | ICD-10-CM

## 2021-08-21 NOTE — Telephone Encounter (Signed)
Pt wants to be sent to an artery doctor states the sports medicine was nothing she wanted to waste her time with

## 2021-08-21 NOTE — Addendum Note (Signed)
Addended by: [name] on: 08/21/2021 11:48 AM   Modules accepted: Orders

## 2021-08-21 NOTE — Telephone Encounter (Signed)
REFERRAL REQUEST Telephone Note  Have you been seen at our office for this problem? YES (Advise that they may need an appointment with their PCP before a referral can be done)  Reason for Referral: Left leg. Hurts to walk Referral discussed with patient: NO  Best contact number of patient for referral team: [phone]    Has patient been seen by a specialist for this issue before: NO  Patient provider preference for referral: Whatever Dr. Catha Nottingham Patient location preference for referral: [facility]   Patient notified that referrals can take up to a week or longer to process. If they haven't heard anything within a week they should call back and speak with the referral department.

## 2021-08-21 NOTE — Telephone Encounter (Signed)
Referred to sports medicine already and she was supposed to follow up 2 weeks ago.  Did she miss the appt or is she having trouble getting in?

## 2021-08-27 DIAGNOSIS — N184 Chronic kidney disease, stage 4 (severe): Secondary | ICD-10-CM | POA: Diagnosis not present

## 2021-08-27 DIAGNOSIS — E785 Hyperlipidemia, unspecified: Secondary | ICD-10-CM | POA: Diagnosis not present

## 2021-08-27 DIAGNOSIS — E782 Mixed hyperlipidemia: Secondary | ICD-10-CM

## 2021-08-28 ENCOUNTER — Other Ambulatory Visit: Payer: Self-pay | Admitting: Cardiovascular Disease

## 2021-08-28 ENCOUNTER — Telehealth: Payer: Self-pay | Admitting: *Deleted

## 2021-08-28 DIAGNOSIS — M79604 Pain in right leg: Secondary | ICD-10-CM

## 2021-08-28 DIAGNOSIS — I739 Peripheral vascular disease, unspecified: Secondary | ICD-10-CM

## 2021-08-28 NOTE — Telephone Encounter (Signed)
Spoke to the patient about a referral to Vascular concerning her increased leg pain when ambulating. She stated that for now she would rather see Dr. Fletcher Anon instead of Vascular Surgery.   Orders placed for a LE Segmental on 6/2 and appointment with Dr. Fletcher Anon on 6/6.

## 2021-08-29 ENCOUNTER — Ambulatory Visit (HOSPITAL_COMMUNITY)
Admission: RE | Admit: 2021-08-29 | Discharge: 2021-08-29 | Disposition: A | Discharge status: Home / Self Care | Payer: Medicare HMO | Source: Ambulatory Visit | Attending: Cardiovascular Disease | Admitting: Cardiovascular Disease

## 2021-08-29 DIAGNOSIS — M79605 Pain in left leg: Secondary | ICD-10-CM | POA: Diagnosis not present

## 2021-08-29 DIAGNOSIS — I739 Peripheral vascular disease, unspecified: Secondary | ICD-10-CM | POA: Diagnosis not present

## 2021-08-29 DIAGNOSIS — M79604 Pain in right leg: Secondary | ICD-10-CM | POA: Diagnosis not present

## 2021-09-02 ENCOUNTER — Ambulatory Visit: Payer: Medicare HMO | Admitting: Cardiovascular Disease

## 2021-09-02 ENCOUNTER — Encounter: Payer: Self-pay | Admitting: Cardiovascular Disease

## 2021-09-02 VITALS — BP 112/56 | HR 88 | Ht 63.0 in | Wt 101.8 lb

## 2021-09-02 DIAGNOSIS — I739 Peripheral vascular disease, unspecified: Secondary | ICD-10-CM | POA: Diagnosis not present

## 2021-09-02 DIAGNOSIS — I10 Essential (primary) hypertension: Secondary | ICD-10-CM | POA: Diagnosis not present

## 2021-09-02 DIAGNOSIS — E782 Mixed hyperlipidemia: Secondary | ICD-10-CM | POA: Diagnosis not present

## 2021-09-02 MED ORDER — CILOSTAZOL 50 MG PO TABS: 50.0000 mg | ORAL_TABLET | Freq: Two times a day (BID) | ORAL | 3 refills | Status: DC

## 2021-09-02 NOTE — Patient Instructions (Signed)
Medication Instructions:  START Pletal 50 mg two times daily  *If you need a refill on your cardiac medications before your next appointment, please call your pharmacy*   Testing/Procedures: Your physician has requested that you have a lower extremity arterial duplex. This test is an ultrasound of the arteries in the legs. It looks at arterial blood flow in the legs. Allow one hour for Lower Arterial scans. There are no restrictions or special instructions  Your physician has requested that you have an aorto-iliac duplex. During this test, an ultrasound is used to evaluate the aorta and iliac arteries. Do not eat after midnight the day before and avoid carbonated beverages   Follow-Up: At [facility], you and your health needs are our priority.  As part of our continuing mission to provide you with exceptional heart care, we have created designated Provider Care Teams.  These Care Teams include your primary Cardiologist (physician) and Advanced Practice Providers (APPs -  Physician Assistants and Nurse Practitioners) who all work together to provide you with the care you need, when you need it.  We recommend signing up for the patient portal called "MyChart".  Sign up information is provided on this After Visit Summary.  MyChart is used to connect with patients for Virtual Visits (Telemedicine).  Patients are able to view lab/test results, encounter notes, upcoming appointments, etc.  Non-urgent messages can be sent to your provider as well.   To learn more about what you can do with MyChart, go to [email or web address].    Your next appointment:   3 month(s)  The format for your next appointment:   In Person  Provider:   Dr. Fletcher Anon  Other [place] (PAD)   General Information:   Research in vascular exercise has demonstrated remarkable improvement in symptoms of leg pain (claudication) without expensive or  invasive interventions. Regular walking programs are extremely helpful for patients with PAD and intermittent claudication.  These steps are designed to help you get started with a safe and effective program to help you walk farther with less pain:   Walk at least three times a week (preferably every day).  Your goal is to build up to 30-45 minutes of total walking time (not counting rest breaks). It may take you several weeks to build up your exercise time starting at 5-10 minutes or whatever you can tolerate.  Walk as far as possible using moderate to maximal pain (7-8 on the scale below) as a signal to stop, and resume walking when the pain goes away.  On a treadmill, set the speed and grade at a level that brings on the claudication pain within 3 to 5 minutes. Walk at this rate until you experience claudication of moderate severity, rest until the pain improves, and then resume walking.  Over time, you will be able to walk longer at the designated speed and grade; workload should then be increased until you develop the pain within 3 to 5 minutes once again.  This regimen will induce a significant benefit. Studies have demonstrated that participants may be able to walk up to three or four times farther and have less leg pain, within twelve weeks, by following this protocol.  Pain Scale    0_____1_____2_____3_____4_____5_____6_____7_____8_____9_____10   No Pain  Moderate Pain                               Maximal Pain

## 2021-09-02 NOTE — Progress Notes (Unsigned)
Cardiology Office Note   Date:  09/03/2021   ID:  Diane, Carlson 21-May-1945, MRN [id number]  PCP:  [name], DO  Cardiologist:   Kathlyn Sacramento, MD   No chief complaint on file.     History of Present Illness: Diane Carlson is a 76 y.o. female who is self-referred for evaluation management of peripheral arterial disease. She has history of breast cancer treated in 2020, essential hypertension, gout, hyperlipidemia, chronic kidney disease, previous tobacco use, COPD previous stroke and carotid endarterectomy.  Most recent GFR was 18.  She quit smoking 13 years ago.  Chronic kidney disease is thought to be due to hypertension.  She is not diabetic.  She is very active at baseline and usually walks 2 miles daily and used to exercise on the treadmill as well.  However, she started having recent left calf discomfort after walking 1-2 blocks.  Symptoms are not always severe and she is able to walk through with frequently.  He has been experiencing left leg and calf pain with minimal distance walking.  She has minimal symptoms on the right side.  Lower extremity arterial Doppler showed noncompressible vessels distally with abnormal TBI especially on the left side.  She denies chest pain or shortness of breath.  She is on Jardiance for renal protection but she is not diabetic.  Past Medical History:  Diagnosis Date   Allergy    Atypical pneumonia 09/25/2017   Cancer ([place])    breast cancer; dx in 04/2018.    Cholesteatoma    Complication of anesthesia    "slow to wake up, couldn't catch my breath"- has had anesthesia since this occurence and did fine.    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    Vitamin D deficiency     Past Surgical History:  Procedure Laterality Date   ABDOMINAL HYSTERECTOMY     APPENDECTOMY     BREAST LUMPECTOMY Left 05/31/2018   BREAST LUMPECTOMY WITH RADIOACTIVE SEED LOCALIZATION Left 05/31/2018   Procedure: LEFT BREAST LUMPECTOMY WITH RADIOACTIVE SEED LOCALIZATION;  Surgeon: Fanny Skates, MD;  Location: [place];  Service: General;  Laterality: Left;   carotid endoectomy Left    cataract Bilateral 02/27/14   EYE SURGERY     catarat surgery bilaterally     Current Outpatient Medications  Medication Sig Dispense Refill   albuterol (VENTOLIN HFA) 108 (90 Base) MCG/ACT inhaler Inhale 2 puffs into the lungs every 6 (six) hours as needed for wheezing or shortness of breath. 8 g 2   amLODipine (NORVASC) 10 MG tablet Take 1 tablet (10 mg total) by mouth daily. 90 tablet 3   anastrozole (ARIMIDEX) 1 MG tablet TAKE 1 TABLET (1 MG TOTAL) BY MOUTH DAILY. 90 tablet 3   aspirin 81 MG tablet Take 81 mg by mouth daily.     cholecalciferol (VITAMIN D) 1000 UNITS tablet Take 1,000 Units by mouth daily.     cilostazol (PLETAL) 50 MG tablet Take 1 tablet (50 mg total) by mouth 2 (two) times daily. 180 tablet 3   doxycycline (VIBRA-TABS) 100 MG tablet Take  1 tablet (100 mg total) by mouth 2 (two) times daily. 14 tablet 0   empagliflozin (JARDIANCE) 25 MG TABS tablet Take by mouth daily.     fluticasone (FLONASE) 50 MCG/ACT nasal spray Place 2 sprays into both nostrils daily. 16 g 6   Garlic 5643 MG CAPS Take 1,000 mg by mouth 2 (two) times daily.      guaiFENesin (MUCINEX) 600 MG 12 hr tablet Take 1 tablet (600 mg total) by mouth 2 (two) times daily. 30 tablet 0   rosuvastatin (CRESTOR) 10 MG tablet Take 1 tablet (10 mg total) by mouth daily. 90 tablet 3   Zinc Sulfate (ZINC 15 PO) Take by mouth.     No current facility-administered medications for this visit.    Allergies:   Pravastatin, Zetia [ezetimibe], Cephalexin, and  Sulfa antibiotics    Social History:  The patient  reports that she quit smoking about 12 years ago. Her smoking use included cigarettes. She has a 20.00 pack-year smoking history. She has never used smokeless tobacco. She reports that she does not drink alcohol and does not use drugs.   Family History:  The patient's family history includes Heart attack in her father and paternal aunt; Heart disease in her father, mother, and paternal aunt; Hypertension in her son; Stroke (age of onset: 2) in her mother.    ROS:  Please see the history of present illness.   Otherwise, review of systems are positive for none.   All other systems are reviewed and negative.    PHYSICAL EXAM: VS:  BP (!) 112/56   Pulse 88   Ht '5\' 3"'$  (1.6 m)   Wt 101 lb 12.8 oz (46.2 kg)   SpO2 94%   BMI 18.03 kg/m  , BMI Body mass index is 18.03 kg/m. GEN: Well nourished, well developed, in no acute distress  HEENT: normal  Neck: no JVD, carotid bruits, or masses Cardiac: RRR; no murmurs, rubs, or gallops,no edema  Respiratory:  clear to auscultation bilaterally, normal work of breathing GI: soft, nontender, nondistended, + BS MS: no deformity or atrophy  Skin: warm and dry, no rash Neuro:  Strength and sensation are intact Psych: euthymic mood, full affect  Vascular: Femoral pulse is +1 bilaterally with bilateral bruits.  Distal pulses are not palpable.   EKG:  EKG is ordered today. The ekg ordered today demonstrates normal sinus rhythm with no significant ST or T wave changes.   Recent Labs: 11/29/2020: Magnesium 2.4 07/02/2021: BUN 25; Creatinine, Ser 2.71; Hemoglobin 12.2; Platelets 276; Potassium 5.0; Sodium 144    Lipid Panel    Component Value Date/Time   CHOL 134 12/29/2019 0816   TRIG 118 12/29/2019 0816   TRIG 116 12/21/2013 0850   HDL 59 12/29/2019 0816   HDL 57 12/21/2013 0850   CHOLHDL 2.3 12/29/2019 0816   CHOLHDL 6.9 08/18/2012 0850   VLDL 42 (H) 08/18/2012 0850   LDLCALC 54 12/29/2019  0816   LDLCALC 48 12/21/2013 0850      Wt Readings from Last 3 Encounters:  09/02/21 101 lb 12.8 oz (46.2 kg)  08/06/21 102 lb (46.3 kg)  07/28/21 103 lb (46.7 kg)           View : No data to display.            ASSESSMENT AND PLAN:  1.  Peripheral arterial disease with moderate to severe left leg claudication: The patient likely has multilevel disease including iliac and infrainguinal disease based on her  physical exam.  Her femoral pulses are diminished bilaterally with bruits.  I discussed with her the natural history and management of claudication.  Fortunately, she quit smoking years ago and she is not diabetic.  Continue aspirin 81 mg daily.  I elected to add cilostazol 50 mg twice daily.  Given her femoral bruits, we will schedule aortoiliac duplex and also obtain lower extremity arterial duplex. Angiography is limited by advanced chronic kidney disease and risk of contrast-induced nephropathy.  However, if she has iliac artery disease that seems to be approachable, this might be achieved with small volume of contrast.  2.  Essential hypertension: Blood pressures controlled on current medications.  3.  Hyperlipidemia: Continue rosuvastatin with a target LDL of less than 70.  4.  Chronic kidney disease stage IV with most recent GFR of 18.  Followed by nephrology.   Disposition:   FU with me in 3 months  Signed,  Kathlyn Sacramento, MD  09/03/2021 1:15 PM     [facility]

## 2021-09-05 ENCOUNTER — Ambulatory Visit (INDEPENDENT_AMBULATORY_CARE_PROVIDER_SITE_OTHER): Payer: Medicare HMO | Admitting: Pharmacist

## 2021-09-05 DIAGNOSIS — I10 Essential (primary) hypertension: Secondary | ICD-10-CM

## 2021-09-05 DIAGNOSIS — N184 Chronic kidney disease, stage 4 (severe): Secondary | ICD-10-CM

## 2021-09-05 NOTE — Progress Notes (Signed)
Chronic Care Management Pharmacy Note  09/05/2021 Name:  Diane Carlson MRN:  [id number] DOB:  June 17, 1945  Summary:  Chronic Kidney Disease CKD (chronic kidney disease) stage 4, GFR 15-29 ml/min  Patient sees Dr. Joelyn Oms at [facility] He started jardiance, would prefer farxiga given evidence  (unable to see his note) Patient can't afford >$140 on insurance Dietary handout provided for patient on kidney dietary recommendations Patient approved for jardiance patient assistance ([employer])--will ship to patient's home; contact nephro for f/u--she sees every 6 months; samples given  Subjective: Diane Carlson is an 76 y.o. year old female who is a primary patient of [name], DO.  The CCM team was consulted for assistance with disease management and care coordination needs.    Engaged with patient by telephone for follow up visit in response to provider referral for pharmacy case management and/or care coordination services.   Consent to Services:  The patient was given information about Chronic Care Management services, agreed to services, and gave verbal consent prior to initiation of services.  Please see initial visit note for detailed documentation.   Patient Care Team: [name], DO as PCP - General (Family Medicine) Rexene Agent, MD as Attending Physician (Nephrology) Nicholas Lose, MD as Consulting Physician (Hematology and Oncology) Fanny Skates, MD as Consulting Physician (General Surgery) [facility] Lavera Guise, [facility] as Pharmacist (Family Medicine)  Objective:  Lab Results  Component Value Date   CREATININE 2.71 (H) 07/02/2021   CREATININE 2.58 (H) 11/29/2020   CREATININE 2.78 (H) 06/28/2020    No results found for: "HGBA1C" Last diabetic Eye exam: No results found for: "HMDIABEYEEXA"  Last diabetic Foot exam: No results found for: "HMDIABFOOTEX"      Component Value Date/Time   CHOL 134 12/29/2019  0816   TRIG 118 12/29/2019 0816   TRIG 116 12/21/2013 0850   HDL 59 12/29/2019 0816   HDL 57 12/21/2013 0850   CHOLHDL 2.3 12/29/2019 0816   CHOLHDL 6.9 08/18/2012 0850   VLDL 42 (H) 08/18/2012 0850   LDLCALC 54 12/29/2019 0816   LDLCALC 48 12/21/2013 0850       Latest Ref Rng & Units 07/02/2021    8:19 AM 11/29/2020   11:36 AM 06/28/2020    8:03 AM  Hepatic Function  Albumin 3.7 - 4.7 g/dL 4.7  4.6  4.6     Lab Results  Component Value Date/Time   TSH 1.210 08/31/2014 09:33 AM       Latest Ref Rng & Units 07/02/2021    8:19 AM 11/29/2020   11:36 AM 06/28/2020    8:03 AM  CBC  WBC 3.4 - 10.8 x10E3/uL 8.9  8.8  8.9   Hemoglobin 11.1 - 15.9 g/dL 12.2  11.4  11.7   Hematocrit 34.0 - 46.6 % 37.3  33.5  34.6   Platelets 150 - 450 x10E3/uL 276  277  312     Lab Results  Component Value Date/Time   VD25OH 45.9 11/29/2020 11:36 AM   VD25OH 38.4 12/29/2019 08:16 AM    Clinical ASCVD: No  The 10-year ASCVD risk score (Arnett DK, et al., 2019) is: 14.1%   Values used to calculate the score:     Age: 49 years     Sex: Female     Is Non-Hispanic African American: No     Diabetic: No     Tobacco smoker: No     Systolic Blood Pressure: 716 mmHg  Is BP treated: Yes     HDL Cholesterol: 59 mg/dL     Total Cholesterol: 134 mg/dL    Other: (CHADS2VASc if Afib, PHQ9 if depression, MMRC or CAT for COPD, ACT, DEXA)  Social History   Tobacco Use  Smoking Status Former   Packs/day: 1.00   Years: 20.00   Total pack years: 20.00   Types: Cigarettes   Quit date: 08/25/2009   Years since quitting: 12.0  Smokeless Tobacco Never   BP Readings from Last 3 Encounters:  09/02/21 (!) 112/56  08/06/21 (!) 125/58  07/28/21 116/69   Pulse Readings from Last 3 Encounters:  09/02/21 88  08/06/21 97  07/28/21 (!) 104   Wt Readings from Last 3 Encounters:  09/02/21 101 lb 12.8 oz (46.2 kg)  08/06/21 102 lb (46.3 kg)  07/28/21 103 lb (46.7 kg)    Assessment: Review of patient past  medical history, allergies, medications, health status, including review of consultants reports, laboratory and other test data, was performed as part of comprehensive evaluation and provision of chronic care management services.   SDOH:  (Social Determinants of Health) assessments and interventions performed:    CCM Care Plan  Allergies  Allergen Reactions   Pravastatin     Muscle aches   Zetia [Ezetimibe]     mylagias   Cephalexin Rash    Inside mouth   Sulfa Antibiotics Rash    Including mouth    Medications Reviewed Today     Reviewed by Lavera Guise, [facility] (Pharmacist) on 09/05/21 at 1413  Med List Status: <None>   Medication Order Taking? Sig Documenting Provider Last Dose Status Informant  albuterol (VENTOLIN HFA) 108 (90 Base) MCG/ACT inhaler [id number] No Inhale 2 puffs into the lungs every 6 (six) hours as needed for wheezing or shortness of breath. Ronnie Doss M, DO Taking Active   amLODipine (NORVASC) 10 MG tablet [id number] No Take 1 tablet (10 mg total) by mouth daily. Ronnie Doss M, DO Taking Active   anastrozole (ARIMIDEX) 1 MG tablet [id number] No TAKE 1 TABLET (1 MG TOTAL) BY MOUTH DAILY. Nicholas Lose, MD Taking Active   aspirin 81 MG tablet [id number] No Take 81 mg by mouth daily. [provider] Taking Active Self  cholecalciferol (VITAMIN D) 1000 UNITS tablet [id number] No Take 1,000 Units by mouth daily. [provider] Taking Active Self  cilostazol (PLETAL) 50 MG tablet [id number]  Take 1 tablet (50 mg total) by mouth 2 (two) times daily. Wellington Hampshire, MD  Active   doxycycline (VIBRA-TABS) 100 MG tablet [id number] No Take 1 tablet (100 mg total) by mouth 2 (two) times daily. [name], NP Taking Active   empagliflozin (JARDIANCE) 25 MG TABS tablet [id number] No Take by mouth daily. [provider] Taking Active   fluticasone (FLONASE) 50 MCG/ACT nasal spray [id number] No Place 2 sprays into both nostrils daily. Baruch Gouty, FNP Taking Active   Garlic 0768 MG CAPS [id number] No Take 1,000 mg by mouth 2 (two) times daily.  [provider] Taking Active Self  guaiFENesin (MUCINEX) 600 MG 12 hr tablet [id number] No Take 1 tablet (600 mg total) by mouth 2 (two) times daily. [name], NP Taking Active   rosuvastatin (CRESTOR) 10 MG tablet [id number] No Take 1 tablet (10 mg total) by mouth daily. Ronnie Doss M, DO Taking Active   Zinc Sulfate (ZINC 15 PO) [id number] No Take by mouth. 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   Breast mass in female 05/23/2018   Vitamin D deficiency    Seasonal allergic rhinitis 11/29/2012   Allergies    Anemia of chronic disease 08/25/2012   FH: TAH-BSO (total abdominal hysterectomy and bilateral salpingo-oophorectomy)    Paget disease of bone    Osteopenia    Essential hypertension, benign    Hyperlipidemia    CKD (chronic kidney disease) stage 4, GFR 15-29 ml/min (HCC)    Gout    Cholesteatoma     Immunization History  Administered Date(s) Administered   Fluad Quad(high Dose 65+) 12/27/2018, 01/23/2020, 01/24/2021   Influenza Whole 12/28/2009   Influenza, High Dose Seasonal PF 03/02/2017   Influenza,inj,Quad PF,6+ Mos 01/15/2014, 01/11/2015, 01/07/2016, 02/21/2018   Moderna Sars-Covid-2 Vaccination 05/02/2019, 05/30/2019, 02/28/2020, 09/11/2020   Pneumococcal Conjugate-13 08/31/2014   Pneumococcal Polysaccharide-23 08/25/2012   Tdap 09/04/2010   Zoster Recombinat (Shingrix) 04/08/2021, 07/02/2021   Zoster, Live 03/10/2013    Conditions to be addressed/monitored: CKD Stage 4  Care Plan : PHARMD MEDICATION MANAGEMENT  Updates made by Lavera Guise, [place] since 09/11/2021 12:00 AM      Problem: DISEASE PRGRESSION PREVENTION      Long-Range Goal: CKD STAGE 4   Recent Progress: Not on track  Note:   Current Barriers:  Unable to independently afford treatment regimen Unable to achieve control of CKD4, HLD   Pharmacist Clinical Goal(s):  patient will verbalize ability to afford treatment regimen achieve control of CKD4, HLD as evidenced by GOAL LABS  through collaboration with PharmD and provider.   Interventions: 1:1 collaboration with [name], DO regarding development and update of comprehensive plan of care as evidenced by provider attestation and co-signature Inter-disciplinary care team collaboration (see longitudinal plan of care) Comprehensive medication review performed; medication list updated in electronic medical record Chronic Kidney Disease: new goal CKD (chronic kidney disease) stage 4, GFR 15-29 ml/min  Patient sees Dr. Joelyn Oms at [facility] He started jardiance, would prefer farxiga given evidence  (unable to see his note) Patient can't afford >$140 on insurance Dietary handout provided for patient on kidney dietary recommendations Patient approved for jardiance patient assistance ([employer])--will ship to patient's home; contact nephro for f/u--she sees every 6 months   Hyperlipidemia:  New goal. controlled; current treatment: rosuvastatin '10mg'$  (appropriately dose per renal);  Current dietary patterns: FOLLOWING A HEART HEALTHY DIET/HEALTHY PLATE METHOD Recommended repeat labs, continue current therapy Lipid Panel     Component Value Date/Time   CHOL 134 12/29/2019 0816   TRIG 118 12/29/2019 0816   TRIG 116 12/21/2013 0850   HDL 59 12/29/2019 0816   HDL 57 12/21/2013 0850   CHOLHDL 2.3 12/29/2019 0816   CHOLHDL 6.9 08/18/2012 0850   VLDL 42 (H) 08/18/2012 0850   LDLCALC 54 12/29/2019 0816   LDLCALC 48 12/21/2013 0850   LABVLDL 21 12/29/2019 0816    Patient Goals/Self-Care Activities patient will:  - take  medications as prescribed as evidenced by patient report and record review collaborate with provider on medication access solutions target a minimum of 150 minutes of moderate intensity exercise weekly engage in dietary modifications by   FOLLOWING A HEART HEALTHY DIET/HEALTHY PLATE METHOD, renal diet       Medication Assistance:  jardiance obtained through  [employer] medication assistance program.  Enrollment ends 02/26/22   Follow Up:  Patient agrees to Care Plan and Follow-up.  Plan: Telephone follow up appointment with care management team member scheduled for:  3 months  Regina Eck, PharmD, BCPS Clinical Pharmacist, [place]  II Phone [phone]

## 2021-09-05 NOTE — Patient Instructions (Addendum)
Visit Information  Following are the goals we discussed today:  Current Barriers:  Unable to independently afford treatment regimen Unable to achieve control of CKD4, HLD   Pharmacist Clinical Goal(s):  patient will verbalize ability to afford treatment regimen achieve control of CKD4, HLD as evidenced by GOAL LABS through collaboration with PharmD and provider.   Interventions: 1:1 collaboration with [name], DO regarding development and update of comprehensive plan of care as evidenced by provider attestation and co-signature Inter-disciplinary care team collaboration (see longitudinal plan of care) Comprehensive medication review performed; medication list updated in electronic medical record Chronic Kidney Disease: new goal CKD (chronic kidney disease) stage 4, GFR 15-29 ml/min  Patient sees Dr. Joelyn Oms at [facility] He started jardiance, would prefer farxiga given evidence  (unable to see his note) Patient can't afford >$140 on insurance Dietary handout provided for patient on kidney dietary recommendations Patient approved for jardiance patient assistance ([employer])--will ship to patient's home; contact nephro for f/u--she sees every 6 months   Hyperlipidemia:  New goal. controlled; current treatment: rosuvastatin '10mg'$  (appropriately dose per renal);  Current dietary patterns: FOLLOWING A HEART HEALTHY DIET/HEALTHY PLATE METHOD Recommended repeat labs, continue current therapy Lipid Panel     Component Value Date/Time   CHOL 134 12/29/2019 0816   TRIG 118 12/29/2019 0816   TRIG 116 12/21/2013 0850   HDL 59 12/29/2019 0816   HDL 57 12/21/2013 0850   CHOLHDL 2.3 12/29/2019 0816   CHOLHDL 6.9 08/18/2012 0850   VLDL 42 (H) 08/18/2012 0850   LDLCALC 54 12/29/2019 0816   LDLCALC 48 12/21/2013 0850   LABVLDL 21 12/29/2019 0816     Patient Goals/Self-Care Activities patient will:  - take medications as prescribed as evidenced by patient report and record  review collaborate with provider on medication access solutions target a minimum of 150 minutes of moderate intensity exercise weekly engage in dietary modifications by FOLLOWING A HEART HEALTHY DIET/HEALTHY PLATE METHOD, renal diet    Plan: Telephone follow up appointment with care management team member scheduled for:  3 weeks  Signature Regina Eck, PharmD, BCPS Clinical Pharmacist, [place]  II Phone [phone]   Please call the care guide team at [phone] if you need to cancel or reschedule your appointment.   The patient verbalized understanding of instructions, educational materials, and care plan provided today and DECLINED offer to receive copy of patient instructions, educational materials, and care plan.

## 2021-09-10 ENCOUNTER — Telehealth: Payer: Self-pay

## 2021-09-10 NOTE — Telephone Encounter (Signed)
Received notification from [place] [employer]) regarding approval for [employer]. Patient assistance approved from 09/05/21 to 03/29/22.  Medication will ship to pt's home  Phone: [phone]

## 2021-09-19 ENCOUNTER — Ambulatory Visit (HOSPITAL_COMMUNITY)
Admission: RE | Admit: 2021-09-19 | Discharge: 2021-09-19 | Disposition: A | Discharge status: Home / Self Care | Payer: Medicare HMO | Source: Ambulatory Visit | Attending: Internal Medicine | Admitting: Internal Medicine

## 2021-09-19 DIAGNOSIS — I739 Peripheral vascular disease, unspecified: Secondary | ICD-10-CM | POA: Diagnosis not present

## 2021-09-26 DIAGNOSIS — N184 Chronic kidney disease, stage 4 (severe): Secondary | ICD-10-CM

## 2021-09-26 DIAGNOSIS — E785 Hyperlipidemia, unspecified: Secondary | ICD-10-CM

## 2021-10-10 ENCOUNTER — Other Ambulatory Visit: Payer: Self-pay | Admitting: Family Medicine

## 2021-10-10 DIAGNOSIS — I10 Essential (primary) hypertension: Secondary | ICD-10-CM

## 2021-10-10 DIAGNOSIS — E782 Mixed hyperlipidemia: Secondary | ICD-10-CM

## 2021-10-30 ENCOUNTER — Other Ambulatory Visit: Payer: Self-pay | Admitting: Family Medicine

## 2021-10-30 DIAGNOSIS — E782 Mixed hyperlipidemia: Secondary | ICD-10-CM

## 2021-10-30 DIAGNOSIS — I10 Essential (primary) hypertension: Secondary | ICD-10-CM

## 2021-11-11 ENCOUNTER — Telehealth: Payer: Medicare HMO

## 2021-12-02 ENCOUNTER — Encounter: Payer: Self-pay | Admitting: Cardiovascular Disease

## 2021-12-02 ENCOUNTER — Ambulatory Visit: Payer: Medicare HMO | Attending: Cardiovascular Disease | Admitting: Cardiovascular Disease

## 2021-12-02 VITALS — BP 124/76 | HR 113 | Ht 63.0 in | Wt 99.4 lb

## 2021-12-02 DIAGNOSIS — N184 Chronic kidney disease, stage 4 (severe): Secondary | ICD-10-CM

## 2021-12-02 DIAGNOSIS — I10 Essential (primary) hypertension: Secondary | ICD-10-CM

## 2021-12-02 DIAGNOSIS — E785 Hyperlipidemia, unspecified: Secondary | ICD-10-CM | POA: Diagnosis not present

## 2021-12-02 DIAGNOSIS — I739 Peripheral vascular disease, unspecified: Secondary | ICD-10-CM

## 2021-12-02 NOTE — Patient Instructions (Signed)
Medication Instructions:  No changes *If you need a refill on your cardiac medications before your next appointment, please call your pharmacy*   Lab Work: None ordered If you have labs (blood work) drawn today and your tests are completely normal, you will receive your results only by: MyChart Message (if you have MyChart) OR A paper copy in the mail If you have any lab test that is abnormal or we need to change your treatment, we will call you to review the results.   Testing/Procedures: None ordered   Follow-Up: At [facility], you and your health needs are our priority.  As part of our continuing mission to provide you with exceptional heart care, we have created designated Provider Care Teams.  These Care Teams include your primary Cardiologist (physician) and Advanced Practice Providers (APPs -  Physician Assistants and Nurse Practitioners) who all work together to provide you with the care you need, when you need it.  We recommend signing up for the patient portal called "MyChart".  Sign up information is provided on this After Visit Summary.  MyChart is used to connect with patients for Virtual Visits (Telemedicine).  Patients are able to view lab/test results, encounter notes, upcoming appointments, etc.  Non-urgent messages can be sent to your provider as well.   To learn more about what you can do with MyChart, go to [email or web address].    Your next appointment:   12 month(s)  The format for your next appointment:   In Person  Provider:   Dr. Arida    

## 2021-12-02 NOTE — Progress Notes (Signed)
Cardiology Office Note   Date:  12/02/2021   ID:  Diane Carlson, Diane Carlson 06/01/1945, MRN [id number]  PCP:  [name], DO  Cardiologist:   Kathlyn Sacramento, MD   No chief complaint on file.     History of Present Illness: Diane Carlson is a 76 y.o. female who is here today for a follow-up visit regarding peripheral arterial disease.   She has history of breast cancer treated in 2020, essential hypertension, gout, hyperlipidemia, chronic kidney disease, previous tobacco use, COPD previous stroke and carotid endarterectomy.  Most recent GFR was 18.  She quit smoking 13 years ago.  Chronic kidney disease is thought to be due to hypertension.  She is not diabetic.  She was seen recently for left calf claudication.  Lower extremity arterial Doppler showed noncompressible vessels with abnormal TBI especially on the left side.  Duplex showed borderline significant left popliteal artery stenosis.  She was prescribed cilostazol but did not tolerate the medication due to palpitations.  She reports improvement in left calf claudication she is able to walk 40 minutes daily without having to stop.  No chest pain or shortness of breath.  She had some gradual weight loss but denies postprandial abdominal pain.  Past Medical History:  Diagnosis Date   Allergy    Atypical pneumonia 09/25/2017   Cancer ([place])    breast cancer; dx in 04/2018.    Cholesteatoma    Complication of anesthesia    "slow to wake up, couldn't catch my breath"- has had anesthesia since this occurence and did fine.    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    Vitamin D deficiency     Past  Surgical History:  Procedure Laterality Date   ABDOMINAL HYSTERECTOMY     APPENDECTOMY     BREAST LUMPECTOMY Left 05/31/2018   BREAST LUMPECTOMY WITH RADIOACTIVE SEED LOCALIZATION Left 05/31/2018   Procedure: LEFT BREAST LUMPECTOMY WITH RADIOACTIVE SEED LOCALIZATION;  Surgeon: Fanny Skates, MD;  Location: [facility];  Service: General;  Laterality: Left;   carotid endoectomy Left    cataract Bilateral 02/27/14   EYE SURGERY     catarat surgery bilaterally     Current Outpatient Medications  Medication Sig Dispense Refill   albuterol (VENTOLIN HFA) 108 (90 Base) MCG/ACT inhaler Inhale 2 puffs into the lungs every 6 (six) hours as needed for wheezing or shortness of breath. 8 g 2   amLODipine (NORVASC) 10 MG tablet TAKE 1 TABLET EVERY DAY 90 tablet 0   anastrozole (ARIMIDEX) 1 MG tablet TAKE 1 TABLET (1 MG TOTAL) BY MOUTH DAILY. 90 tablet 3   aspirin 81 MG tablet Take 81 mg by mouth daily.     cholecalciferol (VITAMIN D) 1000 UNITS tablet Take 1,000 Units by mouth daily.     empagliflozin (JARDIANCE) 25 MG TABS tablet Take by mouth daily.     fluticasone (FLONASE) 50 MCG/ACT nasal spray Place 2 sprays into both nostrils daily. 16 g 6   Garlic 4098 MG CAPS Take 1,000 mg by mouth 2 (two) times daily.      guaiFENesin ([place])  600 MG 12 hr tablet Take 1 tablet (600 mg total) by mouth 2 (two) times daily. 30 tablet 0   rosuvastatin (CRESTOR) 10 MG tablet TAKE 1 TABLET EVERY DAY 90 tablet 0   Zinc Sulfate (ZINC 15 PO) Take by mouth.     cilostazol (PLETAL) 50 MG tablet Take 1 tablet (50 mg total) by mouth 2 (two) times daily. 180 tablet 3   No current facility-administered medications for this visit.    Allergies:   Pravastatin, Zetia [ezetimibe], Cephalexin, and Sulfa antibiotics    Social History:  The patient  reports that she quit smoking about 12 years ago. Her smoking use included cigarettes. She has a 20.00 pack-year smoking history. She has never used smokeless tobacco. She reports  that she does not drink alcohol and does not use drugs.   Family History:  The patient's family history includes Heart attack in her father and paternal aunt; Heart disease in her father, mother, and paternal aunt; Hypertension in her son; Stroke (age of onset: 59) in her mother.    ROS:  Please see the history of present illness.   Otherwise, review of systems are positive for none.   All other systems are reviewed and negative.    PHYSICAL EXAM: VS:  BP 124/76   Pulse (!) 113   Ht '5\' 3"'$  (1.6 m)   Wt 99 lb 6.4 oz (45.1 kg)   SpO2 96%   BMI 17.61 kg/m  , BMI Body mass index is 17.61 kg/m. GEN: Well nourished, well developed, in no acute distress  HEENT: normal  Neck: no JVD, carotid bruits, or masses Cardiac: RRR; no murmurs, rubs, or gallops,no edema  Respiratory:  clear to auscultation bilaterally, normal work of breathing GI: soft, nontender, nondistended, + BS MS: no deformity or atrophy  Skin: warm and dry, no rash Neuro:  Strength and sensation are intact Psych: euthymic mood, full affect     EKG:  EKG is not ordered today.   Recent Labs: 07/02/2021: BUN 25; Creatinine, Ser 2.71; Hemoglobin 12.2; Platelets 276; Potassium 5.0; Sodium 144    Lipid Panel    Component Value Date/Time   CHOL 134 12/29/2019 0816   TRIG 118 12/29/2019 0816   TRIG 116 12/21/2013 0850   HDL 59 12/29/2019 0816   HDL 57 12/21/2013 0850   CHOLHDL 2.3 12/29/2019 0816   CHOLHDL 6.9 08/18/2012 0850   VLDL 42 (H) 08/18/2012 0850   LDLCALC 54 12/29/2019 0816   LDLCALC 48 12/21/2013 0850      Wt Readings from Last 3 Encounters:  12/02/21 99 lb 6.4 oz (45.1 kg)  09/02/21 101 lb 12.8 oz (46.2 kg)  08/06/21 102 lb (46.3 kg)           No data to display            ASSESSMENT AND PLAN:  1.  Peripheral arterial disease with moderate  left leg claudication: Likely due to left popliteal artery stenosis.  She did not tolerate cilostazol due to palpitations but her symptoms improved  with continued walking.  She is able to walk for 2 minutes without having to   2.  Essential hypertension: Blood pressures controlled on current medications.  3.  Hyperlipidemia: Continue rosuvastatin with a target LDL of less than 70.  4.  Chronic kidney disease stage IV with most recent GFR of 18.  Followed by nephrology.   Disposition:   FU with me in 12 months  Signed,  Kathlyn Sacramento, MD  12/02/2021 9:31 AM    [place]

## 2021-12-30 DIAGNOSIS — M79676 Pain in unspecified toe(s): Secondary | ICD-10-CM | POA: Diagnosis not present

## 2021-12-30 DIAGNOSIS — B351 Tinea unguium: Secondary | ICD-10-CM | POA: Diagnosis not present

## 2022-01-02 ENCOUNTER — Encounter: Payer: Self-pay | Admitting: Family Medicine

## 2022-01-02 ENCOUNTER — Ambulatory Visit (INDEPENDENT_AMBULATORY_CARE_PROVIDER_SITE_OTHER): Payer: Medicare HMO | Admitting: Family Medicine

## 2022-01-02 VITALS — BP 133/73 | HR 101 | Temp 98.5°F | Ht 63.0 in | Wt 99.0 lb

## 2022-01-02 DIAGNOSIS — Z0001 Encounter for general adult medical examination with abnormal findings: Secondary | ICD-10-CM

## 2022-01-02 DIAGNOSIS — J449 Chronic obstructive pulmonary disease, unspecified: Secondary | ICD-10-CM

## 2022-01-02 DIAGNOSIS — N184 Chronic kidney disease, stage 4 (severe): Secondary | ICD-10-CM

## 2022-01-02 DIAGNOSIS — D0512 Intraductal carcinoma in situ of left breast: Secondary | ICD-10-CM

## 2022-01-02 DIAGNOSIS — I10 Essential (primary) hypertension: Secondary | ICD-10-CM | POA: Diagnosis not present

## 2022-01-02 DIAGNOSIS — Z23 Encounter for immunization: Secondary | ICD-10-CM

## 2022-01-02 DIAGNOSIS — E782 Mixed hyperlipidemia: Secondary | ICD-10-CM | POA: Diagnosis not present

## 2022-01-02 DIAGNOSIS — Z0000 Encounter for general adult medical examination without abnormal findings: Secondary | ICD-10-CM

## 2022-01-02 MED ORDER — AMLODIPINE BESYLATE 10 MG PO TABS: 10.0000 mg | ORAL_TABLET | Freq: Every day | ORAL | 3 refills | Status: DC

## 2022-01-02 MED ORDER — ROSUVASTATIN CALCIUM 10 MG PO TABS: 10.0000 mg | ORAL_TABLET | Freq: Every day | ORAL | 3 refills | Status: DC

## 2022-01-02 NOTE — Patient Instructions (Signed)
Preventive Care 65 Years and Older, Female Preventive care refers to lifestyle choices and visits with your health care provider that can promote health and wellness. Preventive care visits are also called wellness exams. What can I expect for my preventive care visit? Counseling Your health care provider may ask you questions about your: Medical history, including: Past medical problems. Family medical history. Pregnancy and menstrual history. History of falls. Current health, including: Memory and ability to understand (cognition). Emotional well-being. Home life and relationship well-being. Sexual activity and sexual health. Lifestyle, including: Alcohol, nicotine or tobacco, and drug use. Access to firearms. Diet, exercise, and sleep habits. Work and work environment. Sunscreen use. Safety issues such as seatbelt and bike helmet use. Physical exam Your health care provider will check your: Height and weight. These may be used to calculate your BMI (body mass index). BMI is a measurement that tells if you are at a healthy weight. Waist circumference. This measures the distance around your waistline. This measurement also tells if you are at a healthy weight and may help predict your risk of certain diseases, such as type 2 diabetes and high blood pressure. Heart rate and blood pressure. Body temperature. Skin for abnormal spots. What immunizations do I need?  Vaccines are usually given at various ages, according to a schedule. Your health care provider will recommend vaccines for you based on your age, medical history, and lifestyle or other factors, such as travel or where you work. What tests do I need? Screening Your health care provider may recommend screening tests for certain conditions. This may include: Lipid and cholesterol levels. Hepatitis C test. Hepatitis B test. HIV (human immunodeficiency virus) test. STI (sexually transmitted infection) testing, if you are at  risk. Lung cancer screening. Colorectal cancer screening. Diabetes screening. This is done by checking your blood sugar (glucose) after you have not eaten for a while (fasting). Mammogram. Talk with your health care provider about how often you should have regular mammograms. BRCA-related cancer screening. This may be done if you have a family history of breast, ovarian, tubal, or peritoneal cancers. Bone density scan. This is done to screen for osteoporosis. Talk with your health care provider about your test results, treatment options, and if necessary, the need for more tests. Follow these instructions at home: Eating and drinking  Eat a diet that includes fresh fruits and vegetables, whole grains, lean protein, and low-fat dairy products. Limit your intake of foods with high amounts of sugar, saturated fats, and salt. Take vitamin and mineral supplements as recommended by your health care provider. Do not drink alcohol if your health care provider tells you not to drink. If you drink alcohol: Limit how much you have to 0-1 drink a day. Know how much alcohol is in your drink. In the [place] one drink equals one 12 oz bottle of beer (355 mL), one 5 oz glass of wine (148 mL), or one 1 oz glass of hard liquor (44 mL). Lifestyle Brush your teeth every morning and night with fluoride toothpaste. Floss one time each day. Exercise for at least 30 minutes 5 or more days each week. Do not use any products that contain nicotine or tobacco. These products include cigarettes, chewing tobacco, and vaping devices, such as e-cigarettes. If you need help quitting, ask your health care provider. Do not use drugs. If you are sexually active, practice safe sex. Use a condom or other form of protection in order to prevent STIs. Take aspirin only as told by   your health care provider. Make sure that you understand how much to take and what form to take. Work with your health care provider to find out whether it  is safe and beneficial for you to take aspirin daily. Ask your health care provider if you need to take a cholesterol-lowering medicine (statin). Find healthy ways to manage stress, such as: Meditation, yoga, or listening to music. Journaling. Talking to a trusted person. Spending time with friends and family. Minimize exposure to UV radiation to reduce your risk of skin cancer. Safety Always wear your seat belt while driving or riding in a vehicle. Do not drive: If you have been drinking alcohol. Do not ride with someone who has been drinking. When you are tired or distracted. While texting. If you have been using any mind-altering substances or drugs. Wear a helmet and other protective equipment during sports activities. If you have firearms in your house, make sure you follow all gun safety procedures. What's next? Visit your health care provider once a year for an annual wellness visit. Ask your health care provider how often you should have your eyes and teeth checked. Stay up to date on all vaccines. This information is not intended to replace advice given to you by your health care provider. Make sure you discuss any questions you have with your health care provider. Document Revised: 09/11/2020 Document Reviewed: 09/11/2020 Elsevier Patient Education  2023 Elsevier Inc.  

## 2022-01-02 NOTE — Progress Notes (Signed)
Diane Carlson is a 77 y.o. female presents to office today for annual physical exam examination.    Concerns today include: 1. None.  Wants to learn more about RSV vaccination  Occupation: Retired,  Substance use: None Diet: Balanced, Exercise: Regular Last colonoscopy: Up-to-date Last mammogram: Up-to-date Last pap smear: N/A Refills needed today: All Immunizations needed: Tetanus, flu and RSV Immunization History  Administered Date(s) Administered   Fluad Quad(high Dose 65+) 12/27/2018, 01/23/2020, 01/24/2021   Influenza Whole 12/28/2009   Influenza, High Dose Seasonal PF 03/02/2017   Influenza,inj,Quad PF,6+ Mos 01/15/2014, 01/11/2015, 01/07/2016, 02/21/2018   Moderna Sars-Covid-2 Vaccination 05/02/2019, 05/30/2019, 02/28/2020, 09/11/2020   Pneumococcal Conjugate-13 08/31/2014   Pneumococcal Polysaccharide-23 08/25/2012   Tdap 09/04/2010   Zoster Recombinat (Shingrix) 04/08/2021, 07/02/2021   Zoster, Live 03/10/2013     Past Medical History:  Diagnosis Date   Allergy    Atypical pneumonia 09/25/2017   Cancer ([place])    breast cancer; dx in 04/2018.    Cholesteatoma    Complication of anesthesia    "slow to wake up, couldn't catch my breath"- has had anesthesia since this occurence and did fine.    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[employer]  Tobacco Use   Smoking status: Former    Packs/day: 1.00    Years: 20.00    Total pack years: 20.00    Types: Cigarettes    Quit date: 08/25/2009    Years since quitting: 12.3   Smokeless tobacco: Never  Vaping Use   Vaping Use: Never used  Substance and Sexual Activity   Alcohol use: No   Drug use: No   Sexual activity: Not Currently  Other Topics Concern   Not on file  Social History Narrative   Lives alone   She exercises everyday and enjoys zumba   Social Determinants of Health   Financial Resource Strain: Low Risk  (12/23/2020)   Overall Financial Resource Strain (CARDIA)    Difficulty of Paying Living Expenses: Not hard at all  Food Insecurity: No Food Insecurity (12/23/2020)   Hunger Vital Sign    Worried About Running Out of Food in the Last Year: Never true    [place] in the Last Year: Never true  Transportation Needs: No Transportation Needs (12/23/2020)   PRAPARE - Hydrologist (Medical): No    Lack of Transportation (Non-Medical): No  Physical Activity: Sufficiently Active (12/23/2020)   Exercise Vital Sign    Days of Exercise per Week: 5 days    Minutes of Exercise per Session: 120 min  Stress: No Stress Concern Present (12/23/2020)   [place]    Feeling of Stress : Not  at all  Social Connections: Moderately Integrated (12/23/2020)   Social Connection and Isolation Panel [NHANES]    Frequency of Communication with Friends and Family: More than three times a week    Frequency of Social Gatherings with Friends and Family: More than three times a week    Attends Religious Services: More than 4 times per year    Active Member of [employer] or Organizations: Yes    Attends Archivist Meetings: More than 4 times per year    Marital Status: Widowed  Intimate Partner Violence: Not At Risk (12/23/2020)   Humiliation, Afraid, Rape, and Kick  questionnaire    Fear of Current or Ex-Partner: No    Emotionally Abused: No    Physically Abused: No    Sexually Abused: No   Past Surgical History:  Procedure Laterality Date   ABDOMINAL HYSTERECTOMY     APPENDECTOMY     BREAST LUMPECTOMY Left 05/31/2018   BREAST LUMPECTOMY WITH RADIOACTIVE SEED LOCALIZATION Left 05/31/2018   Procedure: LEFT BREAST LUMPECTOMY WITH RADIOACTIVE SEED LOCALIZATION;  Surgeon: Fanny Skates, MD;  Location: [facility];  Service: General;  Laterality: Left;   carotid endoectomy Left    cataract Bilateral 02/27/14   EYE SURGERY     catarat surgery bilaterally   Family History  Problem Relation Age of Onset   Heart disease Mother    Stroke Mother 34   Heart disease Father    Heart attack Father    Hypertension Son    Heart attack Paternal Aunt    Heart disease Paternal Aunt     Current Outpatient Medications:    albuterol (VENTOLIN HFA) 108 (90 Base) MCG/ACT inhaler, Inhale 2 puffs into the lungs every 6 (six) hours as needed for wheezing or shortness of breath., Disp: 8 g, Rfl: 2   amLODipine (NORVASC) 10 MG tablet, TAKE 1 TABLET EVERY DAY, Disp: 90 tablet, Rfl: 0   anastrozole (ARIMIDEX) 1 MG tablet, TAKE 1 TABLET (1 MG TOTAL) BY MOUTH DAILY., Disp: 90 tablet, Rfl: 3   aspirin 81 MG tablet, Take 81 mg by mouth daily., Disp: , Rfl:    cholecalciferol (VITAMIN D) 1000 UNITS tablet, Take 1,000 Units by mouth daily., Disp: , Rfl:    cyanocobalamin (VITAMIN B12) 500 MCG tablet, Take 500 mcg by mouth daily., Disp: , Rfl:    empagliflozin (JARDIANCE) 25 MG TABS tablet, Take by mouth daily., Disp: , Rfl:    fluticasone (FLONASE) 50 MCG/ACT nasal spray, Place 2 sprays into both nostrils daily., Disp: 16 g, Rfl: 6   Garlic 2229 MG CAPS, Take 1,000 mg by mouth 2 (two) times daily. , Disp: , Rfl:    guaiFENesin (MUCINEX) 600 MG 12 hr tablet, Take 1 tablet (600 mg total) by mouth 2 (two) times daily., Disp: 30 tablet, Rfl: 0   rosuvastatin (CRESTOR) 10 MG tablet, TAKE  1 TABLET EVERY DAY, Disp: 90 tablet, Rfl: 0   Zinc Sulfate (ZINC 15 PO), Take by mouth., Disp: , Rfl:   Allergies  Allergen Reactions   Pravastatin     Muscle aches   Zetia [Ezetimibe]     mylagias   Cephalexin Rash    Inside mouth   Sulfa Antibiotics Rash    Including mouth     ROS: Review of Systems A comprehensive review of systems was negative.    Physical exam BP 133/73   Pulse (!) 101   Temp 98.5 F (36.9 C)   Ht _0  (1.6 m)   Wt  99 lb (44.9 kg)   SpO2 97%   BMI 17.54 kg/m  General appearance: alert, cooperative, appears stated age, and no distress Head: Normocephalic, without obvious abnormality, atraumatic Eyes: negative findings: lids and lashes normal and conjunctivae and sclerae normal Ears: normal TM's and external ear canals both ears Nose: Nares normal. Septum midline. Mucosa normal. No drainage or sinus tenderness. Throat:  Oropharynx without erythema or masses Neck: no adenopathy, no carotid bruit, supple, symmetrical, trachea midline, and thyroid not enlarged, symmetric, no tenderness/mass/nodules Back: symmetric, no curvature. ROM normal. No CVA tenderness. Lungs: clear to auscultation bilaterally Heart: regular rate and rhythm, S1, S2 normal, no murmur, click, rub or gallop Abdomen: soft, non-tender; bowel sounds normal; no masses,  no organomegaly Extremities: extremities normal, atraumatic, no cyanosis or edema Pulses: 2+ and symmetric Skin:  Multiple pigmented nevi and solar lentigo appreciated Lymph nodes: Cervical, supraclavicular, and axillary nodes normal. Neurologic: Grossly normal  Flowsheet Row Office Visit from 01/02/2022 in [facility]  PHQ-2 Total Score 0          Assessment/ Plan: Diane Carlson here for annual physical exam.   Annual physical exam  Essential hypertension, benign - Plan: CMP14+EGFR, amLODipine (NORVASC) 10 MG tablet  CKD (chronic kidney disease) stage 4, GFR 15-29 ml/min (HCC) -  Plan: CMP14+EGFR  Mixed hyperlipidemia - Plan: CMP14+EGFR, Lipid Panel, TSH, rosuvastatin (CRESTOR) 10 MG tablet  Ductal carcinoma in situ (DCIS) of left breast - Plan: CBC  COPD, severe (HCC) - Plan: CBC  Blood pressure is controlled.  No changes.  Norvasc renewed.  Check renal function given initiation of Jardiance.  Keep follow-up with nephrology later this month  Check fasting lipid.  Continue statin  Check CBC.  Continues treatment with Arimidex as directed  Discussed that RSV vaccination would likely be a good idea for her given her known COPD, renal disease etc.  We discussed that atrial fibrillation has been seen during the vaccination trials but I think that the benefit of the vaccine outweighs the risk for this patient at this time  Counseled on healthy lifestyle choices, including diet (rich in fruits, vegetables and lean meats and low in salt and simple carbohydrates) and exercise (at least 30 minutes of moderate physical activity daily).  Patient to follow up in  73m Lonni Dirden M. GLajuana Ripple DO

## 2022-01-03 LAB — CMP14+EGFR
ALT: 15 IU/L (ref 0–32)
AST: 27 IU/L (ref 0–40)
Albumin/Globulin Ratio: 1.7 (ref 1.2–2.2)
Albumin: 4.7 g/dL (ref 3.8–4.8)
Alkaline Phosphatase: 134 IU/L — ABNORMAL HIGH (ref 44–121)
BUN/Creatinine Ratio: 10 — ABNORMAL LOW (ref 12–28)
BUN: 27 mg/dL (ref 8–27)
Bilirubin Total: 0.3 mg/dL (ref 0.0–1.2)
CO2: 24 mmol/L (ref 20–29)
Calcium: 10.4 mg/dL — ABNORMAL HIGH (ref 8.7–10.3)
Chloride: 102 mmol/L (ref 96–106)
Creatinine, Ser: 2.83 mg/dL — ABNORMAL HIGH (ref 0.57–1.00)
Globulin, Total: 2.8 g/dL (ref 1.5–4.5)
Glucose: 100 mg/dL — ABNORMAL HIGH (ref 70–99)
Potassium: 5.7 mmol/L — ABNORMAL HIGH (ref 3.5–5.2)
Sodium: 142 mmol/L (ref 134–144)
Total Protein: 7.5 g/dL (ref 6.0–8.5)
eGFR: 17 mL/min/{1.73_m2} — ABNORMAL LOW (ref >59)

## 2022-01-03 LAB — CBC
Hematocrit: 37.3 % (ref 34.0–46.6)
Hemoglobin: 12.6 g/dL (ref 11.1–15.9)
MCH: 30.4 pg (ref 26.6–33.0)
MCHC: 33.8 g/dL (ref 31.5–35.7)
MCV: 90 fL (ref 79–97)
Platelets: 284 10*3/uL (ref 150–450)
RBC: 4.15 x10E6/uL (ref 3.77–5.28)
RDW: 13.1 % (ref 11.7–15.4)
WBC: 9 10*3/uL (ref 3.4–10.8)

## 2022-01-03 LAB — LIPID PANEL
Chol/HDL Ratio: 1.9 ratio (ref 0.0–4.4)
Cholesterol, Total: 128 mg/dL (ref 100–199)
HDL: 68 mg/dL (ref >39)
LDL Chol Calc (NIH): 42 mg/dL (ref 0–99)
Triglycerides: 97 mg/dL (ref 0–149)
VLDL Cholesterol Cal: 18 mg/dL (ref 5–40)

## 2022-01-03 LAB — TSH: TSH: 0.94 u[IU]/mL (ref 0.450–4.500)

## 2022-01-11 ENCOUNTER — Other Ambulatory Visit: Payer: Self-pay | Admitting: Family Medicine

## 2022-01-13 DIAGNOSIS — N184 Chronic kidney disease, stage 4 (severe): Secondary | ICD-10-CM | POA: Diagnosis not present

## 2022-01-14 ENCOUNTER — Telehealth: Payer: Self-pay | Admitting: Family Medicine

## 2022-01-14 NOTE — Telephone Encounter (Signed)
Please have patient call number below for refills.  We are unable to call for her :  Received notification from [employer]) regarding approval for [employer]. Patient assistance approved from 09/05/21 to 03/29/22.   Medication will ship to pt's home   Phone: [phone]

## 2022-01-14 NOTE — Telephone Encounter (Signed)
Lm making pt aware

## 2022-01-19 ENCOUNTER — Telehealth: Payer: Self-pay | Admitting: Family Medicine

## 2022-01-19 NOTE — Telephone Encounter (Signed)
Patient would like to speak to Almyra Free about patient assistance paperwork that she received in the mail. Please call back

## 2022-01-20 NOTE — Telephone Encounter (Signed)
Can you review patient assistance paperwork with patient and have them bring to me?  Thank you!

## 2022-01-21 DIAGNOSIS — I129 Hypertensive chronic kidney disease with stage 1 through stage 4 chronic kidney disease, or unspecified chronic kidney disease: Secondary | ICD-10-CM | POA: Diagnosis not present

## 2022-01-21 DIAGNOSIS — N184 Chronic kidney disease, stage 4 (severe): Secondary | ICD-10-CM | POA: Diagnosis not present

## 2022-01-21 DIAGNOSIS — R809 Proteinuria, unspecified: Secondary | ICD-10-CM | POA: Diagnosis not present

## 2022-01-21 DIAGNOSIS — D631 Anemia in chronic kidney disease: Secondary | ICD-10-CM | POA: Diagnosis not present

## 2022-01-21 DIAGNOSIS — N2581 Secondary hyperparathyroidism of renal origin: Secondary | ICD-10-CM | POA: Diagnosis not present

## 2022-01-21 NOTE — Telephone Encounter (Signed)
Left message requesting call back regarding patient assistance application

## 2022-01-22 NOTE — Telephone Encounter (Signed)
Pt returned call regarding [employer] re-enrollment application rec'd from company. Walked pt thru pages to be completed/signed. Pt will bring application to office for completion of prescribers pages.

## 2022-01-23 ENCOUNTER — Ambulatory Visit (INDEPENDENT_AMBULATORY_CARE_PROVIDER_SITE_OTHER): Payer: Medicare HMO

## 2022-01-23 DIAGNOSIS — Z23 Encounter for immunization: Secondary | ICD-10-CM | POA: Diagnosis not present

## 2022-02-26 DIAGNOSIS — H31001 Unspecified chorioretinal scars, right eye: Secondary | ICD-10-CM | POA: Diagnosis not present

## 2022-02-26 DIAGNOSIS — H1045 Other chronic allergic conjunctivitis: Secondary | ICD-10-CM | POA: Diagnosis not present

## 2022-02-26 DIAGNOSIS — H04123 Dry eye syndrome of bilateral lacrimal glands: Secondary | ICD-10-CM | POA: Diagnosis not present

## 2022-02-26 DIAGNOSIS — D492 Neoplasm of unspecified behavior of bone, soft tissue, and skin: Secondary | ICD-10-CM | POA: Diagnosis not present

## 2022-02-26 DIAGNOSIS — H35411 Lattice degeneration of retina, right eye: Secondary | ICD-10-CM | POA: Diagnosis not present

## 2022-03-04 ENCOUNTER — Telehealth: Payer: Self-pay | Admitting: Pharmacist

## 2022-03-04 ENCOUNTER — Ambulatory Visit: Payer: Medicare HMO | Admitting: Pharmacist

## 2022-03-04 DIAGNOSIS — N184 Chronic kidney disease, stage 4 (severe): Secondary | ICD-10-CM

## 2022-03-04 NOTE — Telephone Encounter (Signed)
Please re-enroll patient for jardiance '25mg'$  daily Patient informed to check mail for paperwork in the next 2 weeks She will call office for samples if low Thank you!

## 2022-03-05 NOTE — Telephone Encounter (Signed)
MAILING APPLICATION TO PT HOME

## 2022-03-12 NOTE — Progress Notes (Signed)
  Care Management   Follow Up Note   03/04/2022 Name: Diane Carlson MRN: [id number] DOB: 11/12/1945   Referred by: [name], DO Reason for referral : CKD  Patient to continue on Jardiance per nephrology.  She is tolerating medication well.  We will assist with paperwork and patient assistance.  Patient instructed to check her mail and complete the application.  [facility] will continue to ship to her home.    Regina Eck, PharmD, BCPS, BCACP Clinical Pharmacist, [place]  [facility]  T [phone]

## 2022-03-26 ENCOUNTER — Other Ambulatory Visit: Payer: Self-pay | Admitting: Family Medicine

## 2022-03-26 DIAGNOSIS — Z1231 Encounter for screening mammogram for malignant neoplasm of breast: Secondary | ICD-10-CM

## 2022-03-31 DIAGNOSIS — M79676 Pain in unspecified toe(s): Secondary | ICD-10-CM | POA: Diagnosis not present

## 2022-03-31 DIAGNOSIS — B351 Tinea unguium: Secondary | ICD-10-CM | POA: Diagnosis not present

## 2022-03-31 DIAGNOSIS — E1142 Type 2 diabetes mellitus with diabetic polyneuropathy: Secondary | ICD-10-CM | POA: Diagnosis not present

## 2022-03-31 DIAGNOSIS — L84 Corns and callosities: Secondary | ICD-10-CM | POA: Diagnosis not present

## 2022-04-06 NOTE — Telephone Encounter (Signed)
FAXED PCP PG TO OFFICE

## 2022-04-20 ENCOUNTER — Telehealth: Payer: Self-pay | Admitting: Family Medicine

## 2022-04-21 NOTE — Telephone Encounter (Signed)
We do not have any samples of jardiance  Diane Carlson can you check on patient assistance

## 2022-04-21 NOTE — Telephone Encounter (Signed)
Pts number is not working that is on file

## 2022-04-22 NOTE — Telephone Encounter (Signed)
Pt only has 1 pill left of her Jardiance. If we dont have any samples to give her, what other options does she have? Please advise and call patient back on her cell # [phone] or home # [phone].

## 2022-04-22 NOTE — Telephone Encounter (Signed)
Attempted to call pt back no answer

## 2022-04-22 NOTE — Telephone Encounter (Signed)
Pt is calling back from phone number provided, please r/c

## 2022-04-23 NOTE — Telephone Encounter (Signed)
Called pt on home and cell left vm on cell - advised her we do have 1 box for her - and she has been approved for patient assistance

## 2022-04-23 NOTE — Telephone Encounter (Signed)
Pt r/c and is aware

## 2022-04-27 NOTE — Telephone Encounter (Signed)
Received notification from [employer]) regarding approval for [employer]. Patient assistance approved from 04/21/22 to 03/30/23.  Phone: [phone]

## 2022-04-28 ENCOUNTER — Ambulatory Visit (INDEPENDENT_AMBULATORY_CARE_PROVIDER_SITE_OTHER): Payer: Medicare HMO | Admitting: Nurse Practitioner

## 2022-04-28 ENCOUNTER — Encounter: Payer: Self-pay | Admitting: Nurse Practitioner

## 2022-04-28 VITALS — BP 125/70 | HR 104 | Temp 97.5°F | Resp 20 | Ht 63.0 in | Wt 100.0 lb

## 2022-04-28 DIAGNOSIS — J0101 Acute recurrent maxillary sinusitis: Secondary | ICD-10-CM

## 2022-04-28 MED ORDER — AMOXICILLIN-POT CLAVULANATE 875-125 MG PO TABS: 1.0000 | ORAL_TABLET | Freq: Two times a day (BID) | ORAL | 0 refills | Status: DC

## 2022-04-28 NOTE — Progress Notes (Signed)
Subjective:    Patient ID: Diane Carlson, female    DOB: [birth year], 77 y.o.   MRN: [id number]   Chief Complaint: Sinus Problem (Started yesterday)   Sinus Problem This is a new problem. Episode onset: 2 days ago. The problem has been waxing and waning since onset. There has been no fever. The fever has been present for Less than 1 day. Her pain is at a severity of 1/10. The pain is mild. Associated symptoms include congestion, headaches and sinus pressure. Pertinent negatives include no chills or coughing. Past treatments include acetaminophen. The treatment provided no relief.       Review of Systems  Constitutional:  Negative for chills, fatigue, fever and unexpected weight change.  HENT:  Positive for congestion and sinus pressure.   Respiratory:  Negative for cough.   Neurological:  Positive for headaches.       Objective:   Physical Exam Vitals and nursing note reviewed.  Constitutional:      General: She is not in acute distress.    Appearance: Normal appearance. She is well-developed.  HENT:     Head: Normocephalic.     Right Ear: Tympanic membrane normal.     Left Ear: Tympanic membrane normal.     Nose: Nose normal.     Mouth/Throat:     Mouth: Mucous membranes are moist.  Eyes:     Pupils: Pupils are equal, round, and reactive to light.  Neck:     Vascular: No carotid bruit or JVD.  Cardiovascular:     Rate and Rhythm: Normal rate and regular rhythm.     Heart sounds: Normal heart sounds.  Pulmonary:     Effort: Pulmonary effort is normal. No respiratory distress.     Breath sounds: Normal breath sounds. No wheezing or rales.  Chest:     Chest wall: No tenderness.  Abdominal:     General: There is no distension or abdominal bruit.     Palpations: There is no hepatomegaly, splenomegaly, mass or pulsatile mass.     Tenderness: There is no abdominal tenderness.  Musculoskeletal:        General: Normal range of motion.     Cervical back: Normal range  of motion and neck supple.  Lymphadenopathy:     Cervical: No cervical adenopathy.  Skin:    General: Skin is warm and dry.  Neurological:     Mental Status: She is alert and oriented to person, place, and time.     Deep Tendon Reflexes: Reflexes are normal and symmetric.  Psychiatric:        Behavior: Behavior normal.        Thought Content: Thought content normal.        Judgment: Judgment normal.    BP 125/70   Pulse (!) 104   Temp (!) 97.5 F (36.4 C) (Temporal)   Resp 20   Ht '5\' 3"'$  (1.6 m)   Wt 100 lb (45.4 kg)   SpO2 96%   BMI 17.71 kg/m         Assessment & Plan:   Diane Carlson in today with chief complaint of Sinus Problem (Started yesterday)   1. Acute recurrent maxillary sinusitis 1. Take meds as prescribed 2. Use a cool mist humidifier especially during the winter months and when heat has been humid. 3. Use saline nose sprays frequently 4. Saline irrigations of the nose can be very helpful if done frequently.  * 4X daily for 1 week*  *  Use of a nettie pot can be helpful with this. Follow directions with this* 5. Drink plenty of fluids 6. Keep thermostat turn down low 7.For any cough or congestion- mucinex OTC 8. For fever or aces or pains- take tylenol or ibuprofen appropriate for age and weight.  * for fevers greater than 101 orally you may alternate ibuprofen and tylenol every  3 hours.   Meds ordered this encounter  Medications   amoxicillin-clavulanate (AUGMENTIN) 875-125 MG tablet    Sig: Take 1 tablet by mouth 2 (two) times daily.    Dispense:  14 tablet    Refill:  0    Order Specific Question:   Supervising Provider    Answer:   Caryl Pina A [[id number]]        The above assessment and management plan was discussed with the patient. The patient verbalized understanding of and has agreed to the management plan. Patient is aware to call the clinic if symptoms persist or worsen. Patient is aware when to return to the clinic for a  follow-up visit. Patient educated on when it is appropriate to go to the emergency department.   Mary-Margaret Hassell Done, FNP

## 2022-04-28 NOTE — Patient Instructions (Signed)

## 2022-05-15 ENCOUNTER — Telehealth: Payer: Self-pay | Admitting: Hematology and Oncology

## 2022-05-15 ENCOUNTER — Encounter: Payer: Self-pay | Admitting: Nurse Practitioner

## 2022-05-15 ENCOUNTER — Telehealth (INDEPENDENT_AMBULATORY_CARE_PROVIDER_SITE_OTHER): Payer: Medicare HMO | Admitting: Nurse Practitioner

## 2022-05-15 DIAGNOSIS — J440 Chronic obstructive pulmonary disease with acute lower respiratory infection: Secondary | ICD-10-CM

## 2022-05-15 DIAGNOSIS — J209 Acute bronchitis, unspecified: Secondary | ICD-10-CM

## 2022-05-15 MED ORDER — PREDNISONE 20 MG PO TABS
40.0000 mg | ORAL_TABLET | Freq: Every day | ORAL | 0 refills | Status: AC
Start: 2022-05-15 — End: 2022-05-20

## 2022-05-15 MED ORDER — BENZONATATE 100 MG PO CAPS: 100.0000 mg | ORAL_CAPSULE | Freq: Three times a day (TID) | ORAL | 0 refills | Status: DC | PRN

## 2022-05-15 NOTE — Telephone Encounter (Signed)
Rescheduled appointment per room/resource. Patient is aware of the changes made to her upcoming appointment. 

## 2022-05-15 NOTE — Progress Notes (Signed)
Virtual Visit  Note Due to COVID-19 pandemic this visit was conducted virtually. This visit type was conducted due to national recommendations for restrictions regarding the COVID-19 Pandemic (e.g. social distancing, sheltering in place) in an effort to limit this patient's exposure and mitigate transmission in our community. All issues noted in this document were discussed and addressed.  A physical exam was not performed with this format.  I connected with Diane Carlson on 05/15/22 at 11:55 by telephone and verified that I am speaking with the correct person using two identifiers. Diane Carlson is currently located at home and no one is currently with her during visit. The provider, Mary-Margaret Hassell Done, FNP is located in their office at time of visit.  I discussed the limitations, risks, security and privacy concerns of performing an evaluation and management service by telephone and the availability of in person appointments. I also discussed with the patient that there may be a patient responsible charge related to this service. The patient expressed understanding and agreed to proceed.   History and Present Illness:  Patient was seen on 03/3022 and was dx with sinusitis and was given Augmentin. She says she still has a cough. She is taking robitussin for cough which has helped some. Denies fever      Review of Systems  Constitutional:  Negative for chills and fever.  HENT:  Positive for congestion.   Respiratory:  Positive for cough and sputum production. Negative for shortness of breath and wheezing.   Neurological:  Negative for dizziness and headaches.     Observations/Objective: Alert and oriented- answers all questions appropriately No distress Raspy voice Deep dry cough  Assessment and Plan: Diane Carlson in today with chief complaint of Sinusitis   1. Acute bronchitis with COPD ([place]) 1. Take meds as prescribed 2. Use a cool mist humidifier especially  during the winter months and when heat has been humid. 3. Use saline nose sprays frequently 4. Saline irrigations of the nose can be very helpful if done frequently.  * 4X daily for 1 week*  * Use of a nettie pot can be helpful with this. Follow directions with this* 5. Drink plenty of fluids 6. Keep thermostat turn down low 7.For any cough or congestion- tessalon perles 8. For fever or aces or pains- take tylenol or ibuprofen appropriate for age and weight.  * for fevers greater than 101 orally you may alternate ibuprofen and tylenol every  3 hours.   Meds ordered this encounter  Medications   benzonatate (TESSALON PERLES) 100 MG capsule    Sig: Take 1 capsule (100 mg total) by mouth 3 (three) times daily as needed.    Dispense:  20 capsule    Refill:  0    Order Specific Question:   Supervising Provider    Answer:   Caryl Pina A [[id number]]   predniSONE (DELTASONE) 20 MG tablet    Sig: Take 2 tablets (40 mg total) by mouth daily with breakfast for 5 days. 2 po daily for 5 days    Dispense:  10 tablet    Refill:  0    Order Specific Question:   Supervising Provider    Answer:   Caryl Pina A [id number]       Follow Up Instructions: prn    I discussed the assessment and treatment plan with the patient. The patient was provided an opportunity to ask questions and all were answered. The patient agreed with the plan and demonstrated an  understanding of the instructions.   The patient was advised to call back or seek an in-person evaluation if the symptoms worsen or if the condition fails to improve as anticipated.  The above assessment and management plan was discussed with the patient. The patient verbalized understanding of and has agreed to the management plan. Patient is aware to call the clinic if symptoms persist or worsen. Patient is aware when to return to the clinic for a follow-up visit. Patient educated on when it is appropriate to go to the emergency  department.   Time call ended:  12:12  I provided 12 minutes of  non face-to-face time during this encounter.    Mary-Margaret Hassell Done, FNP

## 2022-05-20 ENCOUNTER — Ambulatory Visit
Admission: RE | Admit: 2022-05-20 | Discharge: 2022-05-20 | Disposition: A | Discharge status: Home / Self Care | Payer: Medicare HMO | Source: Ambulatory Visit | Attending: Family Medicine | Admitting: Family Medicine

## 2022-05-20 DIAGNOSIS — Z1231 Encounter for screening mammogram for malignant neoplasm of breast: Secondary | ICD-10-CM

## 2022-05-21 NOTE — Progress Notes (Signed)
Patient Care Team: [name], DO as PCP - General (Family Medicine) Wellington Hampshire, MD as PCP - [facility] (Cardiology) Rexene Agent, MD as Attending Physician (Nephrology) Nicholas Lose, MD as Consulting Physician (Hematology and Oncology) Fanny Skates, MD as Consulting Physician (General Surgery) [facility] Lavera Guise, [facility] as Pharmacist (Family Medicine)  DIAGNOSIS: No diagnosis found.  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 with above-mentioned history of left breast cancer treated with a lumpectomy and is currently on antiestrogen therapy with anastrozole. Mammogram on 05/20/2021 showed no evidence of malignancy bilaterally. She presents to the clinic today for follow-up.     ALLERGIES:  is allergic to pravastatin, zetia [ezetimibe], cephalexin, and sulfa antibiotics.  MEDICATIONS:  Current Outpatient Medications  Medication Sig Dispense Refill   albuterol (VENTOLIN HFA) 108 (90 Base) MCG/ACT inhaler Inhale 2 puffs into the lungs every 6 (six) hours as needed for wheezing or shortness of breath. 8 g 2   amLODipine (NORVASC) 10 MG tablet Take 1 tablet (10 mg total) by mouth daily. 90 tablet 3   amoxicillin-clavulanate (AUGMENTIN) 875-125 MG tablet Take 1 tablet by mouth 2 (two) times daily. 14  tablet 0   anastrozole (ARIMIDEX) 1 MG tablet TAKE 1 TABLET (1 MG TOTAL) BY MOUTH DAILY. 90 tablet 3   aspirin 81 MG tablet Take 81 mg by mouth daily.     benzonatate (TESSALON PERLES) 100 MG capsule Take 1 capsule (100 mg total) by mouth 3 (three) times daily as needed. 20 capsule 0   cholecalciferol (VITAMIN D) 1000 UNITS tablet Take 1,000 Units by mouth daily.     cyanocobalamin (VITAMIN B12) 500 MCG tablet Take 500 mcg by mouth daily.     empagliflozin (JARDIANCE) 25 MG TABS tablet Take by mouth daily.     fluticasone (FLONASE) 50 MCG/ACT nasal spray Place 2 sprays into both nostrils daily. 16 g 6   Garlic 123XX123 MG CAPS Take 1,000 mg by mouth 2 (two) times daily.      guaiFENesin (MUCINEX) 600 MG 12 hr tablet Take 1 tablet (600 mg total) by mouth 2 (two) times daily. 30 tablet 0   rosuvastatin (CRESTOR) 10 MG tablet Take 1 tablet (10 mg total) by mouth daily. 90 tablet 3   Zinc Sulfate (ZINC 15 PO) Take by mouth.     No current facility-administered medications for this visit.    PHYSICAL EXAMINATION: ECOG PERFORMANCE STATUS: {CHL ONC ECOG [id number]}  There were no vitals filed for this visit. There were no vitals filed for this visit.  BREAST:*** No palpable masses or nodules in either right or left breasts. No palpable axillary supraclavicular or infraclavicular adenopathy no breast tenderness or nipple discharge. (exam performed in the presence of a chaperone)  LABORATORY DATA:  I have reviewed the data  as listed    Latest Ref Rng & Units 01/02/2022    8:52 AM 07/02/2021    8:19 AM 11/29/2020   11:36 AM  CMP  Glucose 70 - 99 mg/dL 100  103  103   BUN 8 - 27 mg/dL '27  25  26   '$ Creatinine 0.57 - 1.00 mg/dL 2.83  2.71  2.58   Sodium 134 - 144 mmol/L 142  144  140   Potassium 3.5 - 5.2 mmol/L 5.7  5.0  4.8   Chloride 96 - 106 mmol/L 102  104  101   CO2 20 - 29 mmol/L '24  23  24   '$ Calcium 8.7 - 10.3 mg/dL 10.4  10.3  9.7   Total Protein 6.0 - 8.5 g/dL 7.5     Total Bilirubin  0.0 - 1.2 mg/dL 0.3     Alkaline Phos 44 - 121 IU/L 134     AST 0 - 40 IU/L 27     ALT 0 - 32 IU/L 15       Lab Results  Component Value Date   WBC 9.0 01/02/2022   HGB 12.6 01/02/2022   HCT 37.3 01/02/2022   MCV 90 01/02/2022   PLT 284 01/02/2022   NEUTROABS 5.4 06/28/2020    ASSESSMENT & PLAN:  No problem-specific Assessment & Plan notes found for this encounter.    No orders of the defined types were placed in this encounter.  The patient has a good understanding of the overall plan. she agrees with it. she will call with any problems that may develop before the next visit here. Total time spent: 30 mins including face to face time and time spent for planning, charting and co-ordination of care   Diane Carlson, [place] 05/21/22    I Gardiner Coins am acting as a Education administrator for [employer]  ***

## 2022-05-25 ENCOUNTER — Inpatient Hospital Stay: Payer: Medicare HMO | Admitting: Hematology and Oncology

## 2022-05-26 ENCOUNTER — Inpatient Hospital Stay: Payer: Medicare HMO | Attending: Hematology and Oncology | Admitting: Hematology and Oncology

## 2022-05-26 ENCOUNTER — Other Ambulatory Visit: Payer: Self-pay

## 2022-05-26 VITALS — BP 145/70 | HR 109 | Temp 97.7°F | Resp 16 | Wt 99.8 lb

## 2022-05-26 DIAGNOSIS — Z170 Estrogen receptor positive status [ER+]: Secondary | ICD-10-CM | POA: Diagnosis not present

## 2022-05-26 DIAGNOSIS — Z79811 Long term (current) use of aromatase inhibitors: Secondary | ICD-10-CM | POA: Diagnosis not present

## 2022-05-26 DIAGNOSIS — C50212 Malignant neoplasm of upper-inner quadrant of left female breast: Secondary | ICD-10-CM | POA: Insufficient documentation

## 2022-05-26 MED ORDER — ANASTROZOLE 1 MG PO TABS: 1.0000 mg | ORAL_TABLET | Freq: Every day | ORAL | 3 refills | Status: DC

## 2022-05-26 NOTE — Assessment & Plan Note (Signed)
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 Current treatment: Adjuvant anastrozole 1 mg daily started 06/2018   Anastrozole Toxicities: Occasional hot flashes   Breast cancer surveillance: 1.  Breast exam 05/26/2022: Benign 2.  Mammogram 05/22/2022: Benign breast density category B    Return to clinic in 1 year for follow-up

## 2022-06-16 ENCOUNTER — Telehealth: Payer: Self-pay | Admitting: Family Medicine

## 2022-06-16 NOTE — Telephone Encounter (Signed)
Called patient to schedule Medicare Annual Wellness Visit (AWV). Left message for patient to call back and schedule Medicare Annual Wellness Visit (AWV).  Last date of AWV: 12/23/2020   Please schedule an appointment at any time with either Laura or Courtney, NHA's. .  If any questions, please contact me at [phone].  Thank you,  Stephanie,  AMB Clinical Support [facility] AWV Program Direct Dial ??[phone]    

## 2022-06-17 ENCOUNTER — Telehealth: Payer: Self-pay | Admitting: Family Medicine

## 2022-06-17 NOTE — Telephone Encounter (Signed)
Contacted Diane Carlson to schedule their annual wellness visit. Appointment made for 07/01/2022.  Thank you,  Colletta [place],  [place] Program Direct Dial ??[phone]

## 2022-06-30 DIAGNOSIS — L84 Corns and callosities: Secondary | ICD-10-CM | POA: Diagnosis not present

## 2022-06-30 DIAGNOSIS — M79676 Pain in unspecified toe(s): Secondary | ICD-10-CM | POA: Diagnosis not present

## 2022-06-30 DIAGNOSIS — B351 Tinea unguium: Secondary | ICD-10-CM | POA: Diagnosis not present

## 2022-06-30 DIAGNOSIS — I70203 Unspecified atherosclerosis of native arteries of extremities, bilateral legs: Secondary | ICD-10-CM | POA: Diagnosis not present

## 2022-07-01 ENCOUNTER — Ambulatory Visit (INDEPENDENT_AMBULATORY_CARE_PROVIDER_SITE_OTHER): Payer: Medicare HMO

## 2022-07-01 VITALS — Ht 63.0 in | Wt 101.0 lb

## 2022-07-01 DIAGNOSIS — Z780 Asymptomatic menopausal state: Secondary | ICD-10-CM

## 2022-07-01 DIAGNOSIS — Z0000 Encounter for general adult medical examination without abnormal findings: Secondary | ICD-10-CM | POA: Diagnosis not present

## 2022-07-01 NOTE — Patient Instructions (Signed)
[name] , Thank you for taking time to come for your Medicare Wellness Visit. I appreciate your ongoing commitment to your health goals. Please review the following plan we discussed and let me know if I can assist you in the future.   These are the goals we discussed:  Goals       AWV      12/09/2018 AWV Goal: Fall Prevention  Over the next year, patient will decrease their risk for falls by: Using assistive devices, such as a cane or walker, as needed Identifying fall risks within their home and correcting them by: Removing throw rugs Adding handrails to stairs or ramps Removing clutter and keeping a clear pathway throughout the home Increasing light, especially at night Adding shower handles/bars Raising toilet seat Identifying potential personal risk factors for falls: Medication side effects Incontinence/urgency Vestibular dysfunction Hearing loss Musculoskeletal disorders Neurological disorders Orthostatic hypotension        CKD 4 (pt-stated)      Current Barriers:  Unable to independently afford treatment regimen Unable to achieve control of CKD4, HLD   Pharmacist Clinical Goal(s):  patient will verbalize ability to afford treatment regimen achieve control of CKD4, HLD as evidenced by GOAL LABS through collaboration with PharmD and provider.   Interventions: 1:1 collaboration with [name], DO regarding development and update of comprehensive plan of care as evidenced by provider attestation and co-signature Inter-disciplinary care team collaboration (see longitudinal plan of care) Comprehensive medication review performed; medication list updated in electronic medical record Chronic Kidney Disease: new goal CKD (chronic kidney disease) stage 4, GFR 15-29 ml/min  Patient sees Dr. Joelyn Oms at [facility] He started jardiance, would prefer farxiga given evidence  (unable to see his note) Patient can't afford >$140 on insurance Dietary handout  provided for patient on kidney dietary recommendations Patient approved for jardiance patient assistance ([employer])--will ship to patient's home; contact nephro for f/u--she sees every 6 months   Hyperlipidemia:  New goal. controlled; current treatment: rosuvastatin 10mg  (appropriately dose per renal);  Current dietary patterns: FOLLOWING A HEART HEALTHY DIET/HEALTHY PLATE METHOD Recommended repeat labs, continue current therapy Lipid Panel     Component Value Date/Time   CHOL 134 12/29/2019 0816   TRIG 118 12/29/2019 0816   TRIG 116 12/21/2013 0850   HDL 59 12/29/2019 0816   HDL 57 12/21/2013 0850   CHOLHDL 2.3 12/29/2019 0816   CHOLHDL 6.9 08/18/2012 0850   VLDL 42 (H) 08/18/2012 0850   LDLCALC 54 12/29/2019 0816   LDLCALC 48 12/21/2013 0850   LABVLDL 21 12/29/2019 0816    Patient Goals/Self-Care Activities patient will:  - take medications as prescribed as evidenced by patient report and record review collaborate with provider on medication access solutions target a minimum of 150 minutes of moderate intensity exercise weekly engage in dietary modifications by FOLLOWING A HEART HEALTHY DIET/HEALTHY PLATE METHOD, renal diet        Decrease the likelihood of falling      Remove rugs and small obstacles such as baskets etc. from walkways.      Eat more fruits and vegetables      HDL > 40      HDL > 40      Continue low-fat diet and Crestor as well has your fish oil capsules.        This is a list of the screening recommended for you and due dates:  Health Maintenance  Topic Date Due   Screening for Lung Cancer  03/05/2019   COVID-19 Vaccine (5 - 2023-24 season) 11/28/2021   DEXA scan (bone density measurement)  03/07/2023*   Flu Shot  10/29/2022   Mammogram  05/21/2023   Medicare Annual Wellness Visit  07/01/2023   Colon Cancer Screening  02/26/2027   DTaP/Tdap/Td vaccine (3 - Td or Tdap) 01/03/2032   Pneumonia Vaccine  Completed   Hepatitis C Screening: USPSTF  Recommendation to screen - Ages 74-79 yo.  Completed   Zoster (Shingles) Vaccine  Completed   HPV Vaccine  Aged Out  *Topic was postponed. The date shown is not the original due date.    Advanced directives: Please bring a copy of your health care power of attorney and living will to the office to be added to your chart at your convenience.   Conditions/risks identified: Aim for 30 minutes of exercise or brisk walking, 6-8 glasses of water, and 5 servings of fruits and vegetables each day.   Next appointment: Follow up in one year for your annual wellness visit    Preventive Care 65 Years and Older, Female Preventive care refers to lifestyle choices and visits with your health care provider that can promote health and wellness. What does preventive care include? A yearly physical exam. This is also called an annual well check. Dental exams once or twice a year. Routine eye exams. Ask your health care provider how often you should have your eyes checked. Personal lifestyle choices, including: Daily care of your teeth and gums. Regular physical activity. Eating a healthy diet. Avoiding tobacco and drug use. Limiting alcohol use. Practicing safe sex. Taking low-dose aspirin every day. Taking vitamin and mineral supplements as recommended by your health care provider. What happens during an annual well check? The services and screenings done by your health care provider during your annual well check will depend on your age, overall health, lifestyle risk factors, and family history of disease. Counseling  Your health care provider may ask you questions about your: Alcohol use. Tobacco use. Drug use. Emotional well-being. Home and relationship well-being. Sexual activity. Eating habits. History of falls. Memory and ability to understand (cognition). Work and work Statistician. Reproductive health. Screening  You may have the following tests or measurements: Height, weight, and  BMI. Blood pressure. Lipid and cholesterol levels. These may be checked every 5 years, or more frequently if you are over 27 years old. Skin check. Lung cancer screening. You may have this screening every year starting at age 34 if you have a 30-pack-year history of smoking and currently smoke or have quit within the past 15 years. Fecal occult blood test (FOBT) of the stool. You may have this test every year starting at age 56. Flexible sigmoidoscopy or colonoscopy. You may have a sigmoidoscopy every 5 years or a colonoscopy every 10 years starting at age 72. Hepatitis C blood test. Hepatitis B blood test. Sexually transmitted disease (STD) testing. Diabetes screening. This is done by checking your blood sugar (glucose) after you have not eaten for a while (fasting). You may have this done every 1-3 years. Bone density scan. This is done to screen for osteoporosis. You may have this done starting at age 40. Mammogram. This may be done every 1-2 years. Talk to your health care provider about how often you should have regular mammograms. Talk with your health care provider about your test results, treatment options, and if necessary, the need for more tests. Vaccines  Your health care provider may recommend certain vaccines, such as: Influenza vaccine.  This is recommended every year. Tetanus, diphtheria, and acellular pertussis (Tdap, Td) vaccine. You may need a Td booster every 10 years. Zoster vaccine. You may need this after age 45. Pneumococcal 13-valent conjugate (PCV13) vaccine. One dose is recommended after age 40. Pneumococcal polysaccharide (PPSV23) vaccine. One dose is recommended after age 50. Talk to your health care provider about which screenings and vaccines you need and how often you need them. This information is not intended to replace advice given to you by your health care provider. Make sure you discuss any questions you have with your health care provider. Document  Released: 04/12/2015 Document Revised: 12/04/2015 Document Reviewed: 01/15/2015 Elsevier Interactive Patient Education  2017 [place] Prevention in the Home Falls can cause injuries. They can happen to people of all ages. There are many things you can do to make your home safe and to help prevent falls. What can I do on the outside of my home? Regularly fix the edges of walkways and driveways and fix any cracks. Remove anything that might make you trip as you walk through a door, such as a raised step or threshold. Trim any bushes or trees on the path to your home. Use bright outdoor lighting. Clear any walking paths of anything that might make someone trip, such as rocks or tools. Regularly check to see if handrails are loose or broken. Make sure that both sides of any steps have handrails. Any raised decks and porches should have guardrails on the edges. Have any leaves, snow, or ice cleared regularly. Use sand or salt on walking paths during winter. Clean up any spills in your garage right away. This includes oil or grease spills. What can I do in the bathroom? Use night lights. Install grab bars by the toilet and in the tub and shower. Do not use towel bars as grab bars. Use non-skid mats or decals in the tub or shower. If you need to sit down in the shower, use a plastic, non-slip stool. Keep the floor dry. Clean up any water that spills on the floor as soon as it happens. Remove soap buildup in the tub or shower regularly. Attach bath mats securely with double-sided non-slip rug tape. Do not have throw rugs and other things on the floor that can make you trip. What can I do in the bedroom? Use night lights. Make sure that you have a light by your bed that is easy to reach. Do not use any sheets or blankets that are too big for your bed. They should not hang down onto the floor. Have a firm chair that has side arms. You can use this for support while you get dressed. Do  not have throw rugs and other things on the floor that can make you trip. What can I do in the kitchen? Clean up any spills right away. Avoid walking on wet floors. Keep items that you use a lot in easy-to-reach places. If you need to reach something above you, use a strong step stool that has a grab bar. Keep electrical cords out of the way. Do not use floor polish or wax that makes floors slippery. If you must use wax, use non-skid floor wax. Do not have throw rugs and other things on the floor that can make you trip. What can I do with my stairs? Do not leave any items on the stairs. Make sure that there are handrails on both sides of the stairs and use them. Fix handrails  that are broken or loose. Make sure that handrails are as long as the stairways. Check any carpeting to make sure that it is firmly attached to the stairs. Fix any carpet that is loose or worn. Avoid having throw rugs at the top or bottom of the stairs. If you do have throw rugs, attach them to the floor with carpet tape. Make sure that you have a light switch at the top of the stairs and the bottom of the stairs. If you do not have them, ask someone to add them for you. What else can I do to help prevent falls? Wear shoes that: Do not have high heels. Have rubber bottoms. Are comfortable and fit you well. Are closed at the toe. Do not wear sandals. If you use a stepladder: Make sure that it is fully opened. Do not climb a closed stepladder. Make sure that both sides of the stepladder are locked into place. Ask someone to hold it for you, if possible. Clearly mark and make sure that you can see: Any grab bars or handrails. First and last steps. Where the edge of each step is. Use tools that help you move around (mobility aids) if they are needed. These include: Canes. Walkers. Scooters. Crutches. Turn on the lights when you go into a dark area. Replace any light bulbs as soon as they burn out. Set up your  furniture so you have a clear path. Avoid moving your furniture around. If any of your floors are uneven, fix them. If there are any pets around you, be aware of where they are. Review your medicines with your doctor. Some medicines can make you feel dizzy. This can increase your chance of falling. Ask your doctor what other things that you can do to help prevent falls. This information is not intended to replace advice given to you by your health care provider. Make sure you discuss any questions you have with your health care provider. Document Released: 01/10/2009 Document Revised: 08/22/2015 Document Reviewed: 04/20/2014 Elsevier Interactive Patient Education  2017 [employer].

## 2022-07-01 NOTE — Progress Notes (Signed)
Subjective:   Diane Carlson is a 77 y.o. female who presents for Medicare Annual (Subsequent) preventive examination. I connected with  Shakerah Cage on 07/01/22 by a audio enabled telemedicine application and verified that I am speaking with the correct person using two identifiers.  Patient Location: Home  Provider Location: Home Office  I discussed the limitations of evaluation and management by telemedicine. The patient expressed understanding and agreed to proceed.  Review of Systems     Cardiac Risk Factors include: advanced age (>76men, >50 women);hypertension     Objective:    Today's Vitals   07/01/22 1348  Weight: 101 lb (45.8 kg)  Height: 5\' 3"  (1.6 m)   Body mass index is 17.89 kg/m.     07/01/2022    1:51 PM 12/23/2020    2:25 PM 12/22/2019    9:57 AM 12/09/2018   10:57 AM 05/23/2018    8:18 AM 09/25/2017    4:30 PM 08/04/2017   11:57 AM  Advanced Directives  Does Patient Have a Medical Advance Directive? Yes Yes Yes No No No No  Type of Paramedic of [place];Living will [place];Living will [place];Living will      Does patient want to make changes to medical advance directive?   No - Patient declined      Copy of [place] in Chart? No - copy requested No - copy requested       Would patient like information on creating a medical advance directive?    No - Patient declined No - Patient declined No - Patient declined Yes (MAU/Ambulatory/Procedural Areas - Information given)    Current Medications (verified) Outpatient Encounter Medications as of 07/01/2022  Medication Sig   albuterol (VENTOLIN HFA) 108 (90 Base) MCG/ACT inhaler Inhale 2 puffs into the lungs every 6 (six) hours as needed for wheezing or shortness of breath.   amLODipine (NORVASC) 10 MG tablet Take 1 tablet (10 mg total) by mouth daily.   anastrozole (ARIMIDEX) 1 MG tablet Take 1 tablet (1 mg total) by mouth  daily.   aspirin 81 MG tablet Take 81 mg by mouth daily.   benzonatate (TESSALON PERLES) 100 MG capsule Take 1 capsule (100 mg total) by mouth 3 (three) times daily as needed.   cholecalciferol (VITAMIN D) 1000 UNITS tablet Take 1,000 Units by mouth daily.   cyanocobalamin (VITAMIN B12) 500 MCG tablet Take 500 mcg by mouth daily.   empagliflozin (JARDIANCE) 25 MG TABS tablet Take by mouth daily.   fluticasone (FLONASE) 50 MCG/ACT nasal spray Place 2 sprays into both nostrils daily.   Garlic 123XX123 MG CAPS Take 1,000 mg by mouth 2 (two) times daily.    guaiFENesin (MUCINEX) 600 MG 12 hr tablet Take 1 tablet (600 mg total) by mouth 2 (two) times daily.   rosuvastatin (CRESTOR) 10 MG tablet Take 1 tablet (10 mg total) by mouth daily.   Zinc Sulfate (ZINC 15 PO) Take by mouth.   No facility-administered encounter medications on file as of 07/01/2022.    Allergies (verified) Pravastatin, Zetia [ezetimibe], Cephalexin, and Sulfa antibiotics   History: Past Medical History:  Diagnosis Date   Allergy    Atypical pneumonia 09/25/2017   Cancer    breast cancer; dx in 04/2018.    Cholesteatoma    Complication of anesthesia    "slow to wake up, couldn't catch my breath"- has had anesthesia since this occurence and did fine.  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    Vitamin D deficiency    Past Surgical History:  Procedure Laterality Date   ABDOMINAL HYSTERECTOMY     APPENDECTOMY     BREAST LUMPECTOMY Left 05/31/2018   BREAST LUMPECTOMY WITH RADIOACTIVE SEED LOCALIZATION Left 05/31/2018   Procedure: LEFT BREAST LUMPECTOMY WITH RADIOACTIVE SEED LOCALIZATION;  Surgeon: Fanny Skates,  MD;  Location: [facility];  Service: General;  Laterality: Left;   carotid endoectomy Left    cataract Bilateral 02/27/14   EYE SURGERY     catarat surgery bilaterally   Family History  Problem Relation Age of Onset   Heart disease Mother    Stroke Mother 39   Heart disease Father    Heart attack Father    Hypertension Son    Heart attack Paternal Aunt    Heart disease Paternal Aunt    Social History   Socioeconomic History   Marital status: Widowed    Spouse name: Not on file   Number of children: 2   Years of education: cosemtology certificate   Highest education level: Some college, no degree  Occupational History   Occupation: Retired    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[employer]  Tobacco Use   Smoking status: Former    Packs/day: 1.00    Years: 20.00    Additional pack years: 0.00    Total pack years: 20.00    Types: Cigarettes    Quit date: 08/25/2009    Years since quitting: 12.8   Smokeless tobacco: Never  Vaping Use   Vaping Use: Never used  Substance and Sexual Activity   Alcohol use: No   Drug use: No   Sexual activity: Not Currently  Other Topics Concern   Not on file  Social History Narrative   Lives alone   She exercises everyday and enjoys zumba   Social Determinants of Health   Financial Resource Strain: Low Risk  (07/01/2022)   Overall Financial Resource Strain (CARDIA)    Difficulty of Paying Living Expenses: Not hard at all  Food Insecurity: No Food Insecurity (07/01/2022)   Hunger Vital Sign    Worried About Running Out of Food in the Last Year: Never true    Ran Out of Food in the Last Year: Never true  Transportation Needs: No Transportation Needs (07/01/2022)   PRAPARE - Hydrologist (Medical): No    Lack of Transportation (Non-Medical): No  Physical Activity: Sufficiently Active (07/01/2022)   Exercise Vital Sign    Days of Exercise per Week: 5 days    Minutes of Exercise per Session: 60 min  Stress: No Stress  Concern Present (07/01/2022)   [place]    Feeling of Stress : Not at all  Social Connections: Moderately Integrated (07/01/2022)   Social Connection and Isolation Panel [NHANES]    Frequency of Communication with Friends and Family: More than three times a week    Frequency of Social Gatherings with Friends and Family: More than three times a week    Attends Religious Services: More than 4 times  per year    Active Member of Clubs or Organizations: Yes    Attends Archivist Meetings: More than 4 times per year    Marital Status: Widowed    Tobacco Counseling Counseling given: Not Answered   Clinical Intake:  Pre-visit preparation completed: Yes  Pain : No/denies pain     Nutritional Risks: None Diabetes: No  How often do you need to have someone help you when you read instructions, pamphlets, or other written materials from your doctor or pharmacy?: 1 - Never  Diabetic?no   Interpreter Needed?: No  Information entered by :: Jadene Pierini, LPN   Activities of Daily Living    07/01/2022    1:51 PM  In your present state of health, do you have any difficulty performing the following activities:  Hearing? 0  Vision? 0  Difficulty concentrating or making decisions? 0  Walking or climbing stairs? 0  Dressing or bathing? 0  Doing errands, shopping? 0  Preparing Food and eating ? N  Using the Toilet? N  In the past six months, have you accidently leaked urine? N  Do you have problems with loss of bowel control? N  Managing your Medications? N  Managing your Finances? N  Housekeeping or managing your Housekeeping? N    Patient Care Team: [name], DO as PCP - General (Family Medicine) Wellington Hampshire, MD as PCP - [facility] (Cardiology) Rexene Agent, MD as Attending Physician (Nephrology) Nicholas Lose, MD as Consulting Physician (Hematology and Oncology) Fanny Skates, MD  as Consulting Physician (General Surgery) [facility] Lavera Guise, [facility] as Pharmacist (Family Medicine)  Indicate any recent Medical Services you may have received from other than [facility] providers in the past year (date may be approximate).     Assessment:   This is a routine wellness examination for Diane Carlson.  Hearing/Vision screen Vision Screening - Comments:: Wears rx glasses - up to date with routine eye exams with  Dr.groat   Dietary issues and exercise activities discussed: Current Exercise Habits: Home exercise routine, Type of exercise: walking, Time (Minutes): 30, Frequency (Times/Week): 3, Weekly Exercise (Minutes/Week): 90, Intensity: Mild, Exercise limited by: None identified   Goals Addressed             This Visit's Progress    Eat more fruits and vegetables   On track      Depression Screen    07/01/2022    1:50 PM 04/28/2022    8:57 AM 01/02/2022    8:02 AM 08/06/2021    9:03 AM 07/28/2021    9:22 AM 12/31/2020    9:10 AM 12/23/2020    1:47 PM  PHQ 2/9 Scores  PHQ - 2 Score 0 0 0 0 0 0 0  PHQ- 9 Score  0  0  0 0    Fall Risk    07/01/2022    1:49 PM 04/28/2022    8:57 AM 01/02/2022    8:02 AM 08/06/2021    9:03 AM 07/28/2021    9:22 AM  Fall Risk   Falls in the past year? 0 0 0 0 0  Number falls in past yr: 0      Injury with Fall? 0      Risk for fall due to : No Fall Risks      Follow up Falls prevention discussed        FALL RISK PREVENTION PERTAINING TO THE HOME:  Any stairs in or around  the home? No  If so, are there any without handrails? No  Home free of loose throw rugs in walkways, pet beds, electrical cords, etc? Yes  Adequate lighting in your home to reduce risk of falls? Yes   ASSISTIVE DEVICES UTILIZED TO PREVENT FALLS:  Life alert? No  Use of a cane, walker or w/c? No  Grab bars in the bathroom? No  Shower chair or bench in shower? No  Elevated toilet seat or a handicapped toilet? No       08/04/2017   11:59 AM  07/29/2016    8:57 AM  MMSE - Mini Mental State Exam  Orientation to time 5 5  Orientation to Place 5 5  Registration 3 3  Attention/ Calculation 4 5  Recall 3 3  Language- name 2 objects 2 2  Language- repeat 1 1  Language- follow 3 step command 3 3  Language- read & follow direction 1 1  Write a sentence 1 1  Copy design 0 1  Total score 28 30        07/01/2022    1:51 PM 12/23/2020    2:24 PM 12/22/2019    9:59 AM 12/09/2018   11:01 AM  6CIT Screen  What Year? 0 points 0 points 0 points 0 points  What month? 0 points 0 points 0 points 0 points  What time? 0 points 0 points 0 points 0 points  Count back from 20 0 points 0 points 0 points 0 points  Months in reverse 0 points 4 points 4 points 0 points  Repeat phrase 0 points 0 points 0 points 0 points  Total Score 0 points 4 points 4 points 0 points    Immunizations Immunization History  Administered Date(s) Administered   Fluad Quad(high Dose 65+) 12/27/2018, 01/23/2020, 01/24/2021, 01/23/2022   Influenza Whole 12/28/2009   Influenza, High Dose Seasonal PF 03/02/2017   Influenza,inj,Quad PF,6+ Mos 01/15/2014, 01/11/2015, 01/07/2016, 02/21/2018   Moderna Sars-Covid-2 Vaccination 05/02/2019, 05/30/2019, 02/28/2020, 09/11/2020   Pneumococcal Conjugate-13 08/31/2014   Pneumococcal Polysaccharide-23 08/25/2012   Respiratory Syncytial Virus Vaccine,Recomb Aduvanted(Arexvy) 01/15/2022   Td 01/02/2022   Tdap 09/04/2010   Zoster Recombinat (Shingrix) 04/08/2021, 07/02/2021   Zoster, Live 03/10/2013    TDAP status: Up to date  Flu Vaccine status: Up to date  Pneumococcal vaccine status: Up to date  Covid-19 vaccine status: Completed vaccines  Qualifies for Shingles Vaccine? Yes   Zostavax completed Yes   Shingrix Completed?: Yes  Screening Tests Health Maintenance  Topic Date Due   Lung Cancer Screening  03/05/2019   COVID-19 Vaccine (5 - 2023-24 season) 11/28/2021   DEXA SCAN  03/07/2023 (Originally 01/21/2022)    INFLUENZA VACCINE  10/29/2022   MAMMOGRAM  05/21/2023   Medicare Annual Wellness (AWV)  07/01/2023   COLONOSCOPY (Pts 45-96yrs Insurance coverage will need to be confirmed)  02/26/2027   DTaP/Tdap/Td (3 - Td or Tdap) 01/03/2032   Pneumonia Vaccine 71+ Years old  Completed   Hepatitis C Screening  Completed   Zoster Vaccines- Shingrix  Completed   HPV VACCINES  Aged Out    Health Maintenance  Health Maintenance Due  Topic Date Due   Lung Cancer Screening  03/05/2019   COVID-19 Vaccine (5 - 2023-24 season) 11/28/2021    Colorectal cancer screening: No longer required.   Mammogram status: No longer required due to age.  Bone Density status: Ordered 07/01/2022. Pt provided with contact info and advised to call to schedule appt.  Lung Cancer Screening: (  Low Dose CT Chest recommended if Age 11-80 years, 30 pack-year currently smoking OR have quit w/in 15years.) does not qualify.   Lung Cancer Screening Referral: n/a  Additional Screening:  Hepatitis C Screening: does not qualify; Completed 03/04/2015  Vision Screening: Recommended annual ophthalmology exams for early detection of glaucoma and other disorders of the eye. Is the patient up to date with their annual eye exam?  Yes  Who is the provider or what is the name of the office in which the patient attends annual eye exams? Dr.Groat  If pt is not established with a provider, would they like to be referred to a provider to establish care? No .   Dental Screening: Recommended annual dental exams for proper oral hygiene  Community Resource Referral / Chronic Care Management: CRR required this visit?  No   CCM required this visit?  No      Plan:     I have personally reviewed and noted the following in the patient's chart:   Medical and social history Use of alcohol, tobacco or illicit drugs  Current medications and supplements including opioid prescriptions. Patient is not currently taking opioid  prescriptions. Functional ability and status Nutritional status Physical activity Advanced directives List of other physicians Hospitalizations, surgeries, and ER visits in previous 12 months Vitals Screenings to include cognitive, depression, and falls Referrals and appointments  In addition, I have reviewed and discussed with patient certain preventive protocols, quality metrics, and best practice recommendations. A written personalized care plan for preventive services as well as general preventive health recommendations were provided to patient.     Daphane Shepherd, LPN   [id number]   Nurse Notes: none

## 2022-07-06 ENCOUNTER — Encounter: Payer: Self-pay | Admitting: Family Medicine

## 2022-07-06 ENCOUNTER — Ambulatory Visit (INDEPENDENT_AMBULATORY_CARE_PROVIDER_SITE_OTHER): Payer: Medicare HMO

## 2022-07-06 ENCOUNTER — Ambulatory Visit (INDEPENDENT_AMBULATORY_CARE_PROVIDER_SITE_OTHER): Payer: Medicare HMO | Admitting: Family Medicine

## 2022-07-06 VITALS — BP 133/69 | HR 92 | Temp 98.7°F | Ht 63.0 in | Wt 100.4 lb

## 2022-07-06 DIAGNOSIS — R0602 Shortness of breath: Secondary | ICD-10-CM | POA: Diagnosis not present

## 2022-07-06 DIAGNOSIS — Z87891 Personal history of nicotine dependence: Secondary | ICD-10-CM

## 2022-07-06 DIAGNOSIS — R918 Other nonspecific abnormal finding of lung field: Secondary | ICD-10-CM

## 2022-07-06 DIAGNOSIS — I10 Essential (primary) hypertension: Secondary | ICD-10-CM

## 2022-07-06 DIAGNOSIS — J449 Chronic obstructive pulmonary disease, unspecified: Secondary | ICD-10-CM

## 2022-07-06 DIAGNOSIS — F419 Anxiety disorder, unspecified: Secondary | ICD-10-CM | POA: Diagnosis not present

## 2022-07-06 DIAGNOSIS — J301 Allergic rhinitis due to pollen: Secondary | ICD-10-CM | POA: Diagnosis not present

## 2022-07-06 DIAGNOSIS — J9811 Atelectasis: Secondary | ICD-10-CM | POA: Diagnosis not present

## 2022-07-06 DIAGNOSIS — N184 Chronic kidney disease, stage 4 (severe): Secondary | ICD-10-CM

## 2022-07-06 DIAGNOSIS — D0512 Intraductal carcinoma in situ of left breast: Secondary | ICD-10-CM

## 2022-07-06 MED ORDER — AZELASTINE HCL 0.1 % NA SOLN: 1.0000 | Freq: Two times a day (BID) | NASAL | 12 refills | Status: DC

## 2022-07-06 NOTE — Progress Notes (Unsigned)
Subjective: CC:*** PCP: Raliegh Ip, DO AVW:UJWJ Diane Carlson is a 77 y.o. female presenting to clinic today for:  1. ***   ROS: Per HPI  Allergies  Allergen Reactions   Pravastatin     Muscle aches   Zetia [Ezetimibe]     mylagias   Cephalexin Rash    Inside mouth   Sulfa Antibiotics Rash    Including mouth   Past Medical History:  Diagnosis Date   Allergy    Atypical pneumonia 09/25/2017   Cancer    breast cancer; dx in 04/2018.    Cholesteatoma    Complication of anesthesia    "slow to wake up, couldn't catch my breath"- has had anesthesia since this occurence and did fine.    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    Vitamin D deficiency     Current Outpatient Medications:    albuterol (VENTOLIN HFA) 108 (90 Base) MCG/ACT inhaler, Inhale 2 puffs into the lungs every 6 (six) hours as needed for wheezing or shortness of breath., Disp: 8 g, Rfl: 2   amLODipine (NORVASC) 10 MG tablet, Take 1 tablet (10 mg total) by mouth daily., Disp: 90 tablet, Rfl: 3   anastrozole (ARIMIDEX) 1 MG tablet, Take 1 tablet (1 mg total) by mouth daily., Disp: 90 tablet, Rfl: 3   aspirin 81 MG tablet, Take 81 mg by mouth daily., Disp: , Rfl:    benzonatate (TESSALON PERLES) 100 MG capsule, Take 1 capsule (100 mg total) by mouth 3 (three) times daily as needed., Disp: 20 capsule, Rfl: 0   cholecalciferol (VITAMIN D) 1000 UNITS tablet, Take 1,000 Units by mouth daily., Disp: , Rfl:    cyanocobalamin (VITAMIN B12) 500 MCG tablet, Take 500 mcg by mouth daily., Disp: , Rfl:    empagliflozin (JARDIANCE) 25 MG TABS tablet, Take by mouth daily., Disp: , Rfl:    fluticasone (FLONASE)  50 MCG/ACT nasal spray, Place 2 sprays into both nostrils daily., Disp: 16 g, Rfl: 6   Garlic 1000 MG CAPS, Take 1,000 mg by mouth 2 (two) times daily. 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[employer]  Tobacco Use   Smoking status: Former    Packs/day: 1.00    Years: 20.00    Additional pack years: 0.00    Total pack years: 20.00    Types: Cigarettes    Quit date: 08/25/2009    Years since quitting: 12.8   Smokeless tobacco: Never  Vaping Use   Vaping Use: Never used  Substance and Sexual Activity   Alcohol use: No   Drug use: No   Sexual activity: Not Currently  Other Topics Concern   Not on file  Social History Narrative   Lives alone   She exercises everyday and enjoys zumba   Social Determinants of Health   Financial Resource Strain: Low Risk  (07/01/2022)   Overall Financial Resource Strain (CARDIA)    Difficulty of Paying Living Expenses: Not hard at all  Food Insecurity: No Food Insecurity (07/01/2022)   Hunger Vital Sign    Worried About Running Out of Food in the Last Year: Never true    Ran Out of Food in the Last Year: Never true  Transportation Needs: No Transportation Needs (07/01/2022)   PRAPARE - Administrator, Civil Service (Medical): No    Lack of Transportation (Non-Medical): No  Physical Activity: Sufficiently Active (07/01/2022)   Exercise Vital Sign    Days of Exercise per Week: 5 days     Minutes of Exercise per Session: 60 min  Stress: No Stress Concern Present (07/01/2022)   [employer] of Occupational Health - Occupational Stress Questionnaire    Feeling of Stress : Not at all  Social Connections: Moderately Integrated (07/01/2022)   Social Connection and Isolation Panel [NHANES]    Frequency of Communication with Friends and Family: More than three times a week    Frequency of Social Gatherings with Friends and Family: More than three times a week    Attends Religious Services: More than 4 times per year    Active Member of [employer] or Organizations: Yes    Attends Banker Meetings: More than 4 times per year    Marital Status: Widowed  Intimate Partner Violence: Not At Risk (07/01/2022)   Humiliation, Afraid, Rape, and Kick questionnaire    Fear of Current or Ex-Partner: No    Emotionally Abused: No    Physically Abused: No    Sexually Abused: No   Family History  Problem Relation Age of Onset   Heart disease Mother    Stroke Mother 23   Heart disease Father    Heart attack Father    Hypertension Son    Heart attack Paternal Aunt    Heart disease Paternal Aunt     Objective: Office vital signs reviewed. BP 133/69   Pulse 92   Temp 98.7 F (37.1 C)   Ht 5\' 3"  (1.6 m)   Wt 100 lb 6.4 oz (45.5 kg)   SpO2 95%   BMI 17.79 kg/m   Physical Examination:  General: Awake, alert, *** nourished, No acute distress HEENT: Normal    Neck: No masses palpated. No lymphadenopathy    Ears: Tympanic membranes intact, normal light reflex, no erythema, no bulging    Eyes: PERRLA, extraocular membranes intact, sclera ***    Nose: nasal turbinates moist, *** nasal discharge    Throat: moist mucus membranes, no erythema, *** tonsillar exudate.  Airway is patent Cardio: regular rate and rhythm, S1S2 heard, no murmurs appreciated Pulm: clear to auscultation bilaterally, no wheezes, rhonchi or rales; normal work of breathing on room air GI: soft, non-tender,  non-distended, bowel sounds present x4, no hepatomegaly, no splenomegaly, no masses GU: external vaginal tissue ***, cervix ***, *** punctate lesions on cervix appreciated, *** discharge from cervical os, *** bleeding, *** cervical motion tenderness, *** abdominal/ adnexal masses Extremities: warm, well perfused, No edema, cyanosis or clubbing; +***  pulses bilaterally MSK: *** gait and *** station Skin: dry; intact; no rashes or lesions Neuro: *** Strength and light touch sensation grossly intact, *** DTRs ***/4  Assessment/ Plan: 77 y.o. female   ***  No orders of the defined types were placed in this encounter.  No orders of the defined types were placed in this encounter.    Raliegh IpAshly M Langley Flatley, DO [facility] [phone]

## 2022-07-07 MED ORDER — BUSPIRONE HCL 5 MG PO TABS: 5.0000 mg | ORAL_TABLET | Freq: Two times a day (BID) | ORAL | 0 refills | Status: DC

## 2022-07-07 MED ORDER — DOXYCYCLINE HYCLATE 100 MG PO TABS: 100.0000 mg | ORAL_TABLET | Freq: Two times a day (BID) | ORAL | 0 refills | Status: AC

## 2022-07-20 DIAGNOSIS — N184 Chronic kidney disease, stage 4 (severe): Secondary | ICD-10-CM | POA: Diagnosis not present

## 2022-07-22 ENCOUNTER — Telehealth: Payer: Self-pay | Admitting: Family Medicine

## 2022-07-22 NOTE — Telephone Encounter (Signed)
Needs to r/s appt from 4/26

## 2022-07-24 ENCOUNTER — Telehealth: Payer: Medicare HMO

## 2022-07-24 DIAGNOSIS — N2581 Secondary hyperparathyroidism of renal origin: Secondary | ICD-10-CM | POA: Diagnosis not present

## 2022-07-24 DIAGNOSIS — D631 Anemia in chronic kidney disease: Secondary | ICD-10-CM | POA: Diagnosis not present

## 2022-07-24 DIAGNOSIS — I129 Hypertensive chronic kidney disease with stage 1 through stage 4 chronic kidney disease, or unspecified chronic kidney disease: Secondary | ICD-10-CM | POA: Diagnosis not present

## 2022-07-24 DIAGNOSIS — N184 Chronic kidney disease, stage 4 (severe): Secondary | ICD-10-CM | POA: Diagnosis not present

## 2022-07-24 DIAGNOSIS — R809 Proteinuria, unspecified: Secondary | ICD-10-CM | POA: Diagnosis not present

## 2022-07-27 ENCOUNTER — Ambulatory Visit (INDEPENDENT_AMBULATORY_CARE_PROVIDER_SITE_OTHER): Payer: Medicare HMO

## 2022-07-27 DIAGNOSIS — R918 Other nonspecific abnormal finding of lung field: Secondary | ICD-10-CM | POA: Diagnosis not present

## 2022-07-27 DIAGNOSIS — J439 Emphysema, unspecified: Secondary | ICD-10-CM | POA: Diagnosis not present

## 2022-07-29 ENCOUNTER — Other Ambulatory Visit: Payer: Self-pay | Admitting: Family Medicine

## 2022-08-06 ENCOUNTER — Encounter (HOSPITAL_BASED_OUTPATIENT_CLINIC_OR_DEPARTMENT_OTHER): Payer: Self-pay | Admitting: Pulmonary Disease

## 2022-08-06 ENCOUNTER — Telehealth: Payer: Self-pay | Admitting: Pulmonary Disease

## 2022-08-06 ENCOUNTER — Ambulatory Visit (HOSPITAL_BASED_OUTPATIENT_CLINIC_OR_DEPARTMENT_OTHER): Payer: Medicare HMO | Admitting: Pulmonary Disease

## 2022-08-06 ENCOUNTER — Telehealth: Payer: Medicare HMO

## 2022-08-06 VITALS — BP 126/60 | HR 91 | Temp 97.6°F | Ht 60.0 in | Wt 98.3 lb

## 2022-08-06 DIAGNOSIS — R9389 Abnormal findings on diagnostic imaging of other specified body structures: Secondary | ICD-10-CM

## 2022-08-06 DIAGNOSIS — J189 Pneumonia, unspecified organism: Secondary | ICD-10-CM | POA: Diagnosis not present

## 2022-08-06 DIAGNOSIS — J449 Chronic obstructive pulmonary disease, unspecified: Secondary | ICD-10-CM

## 2022-08-06 MED ORDER — STIOLTO RESPIMAT 2.5-2.5 MCG/ACT IN AERS: 2.0000 | INHALATION_SPRAY | Freq: Every day | RESPIRATORY_TRACT | 0 refills | Status: DC

## 2022-08-06 NOTE — Patient Instructions (Addendum)
X CT chest wo con in 2 weeks  X Trial of stiolto -2 puffs daily - call for Rx if this works

## 2022-08-06 NOTE — Assessment & Plan Note (Signed)
Lung function is very low in 2019.  She has severe emphysema.  She does not desaturate on walking.  She feels that Trelegy caused her recent pneumonia to retry. I gave her a sample of Stiolto -she will call back for prescription if this is helpful. We will reassess PFTs once acute issues resolved

## 2022-08-06 NOTE — Progress Notes (Signed)
Subjective:    Patient ID: Diane Carlson, female    DOB: 05-Sep-1945, 77 y.o.   MRN: [id number]  HPI  77 year old ex-smoker from [place] presents for evaluation of abnormal imaging studies. She reports shortness of breath ongoing for 6 to 8 months.  Accompanied by her granddaughter Stephannie Peters corroborates history.  She reports dyspnea on exertion, relieved by resting, worsened by anxiety and nervousness.  She reports an episode of pneumonia 2 years ago.  She reports a tickle in her throat She reports an episode 2 months ago, PCP ordered wheezing and gave her an antibiotic and steroid .  She was given a sample of Trelegy and feels that this caused her pneumonia Chest x-ray 4/8 showed right upper lobe patchy infiltrate and she was given another antibiotic. Chest x-ray 4/29 showed reticulonodular infiltrates persistent in the right upper lobe reinflation She is able to walk a mile daily and she goes to the gym twice a week.  She denies sputum production or wheezing.  PMH-breast cancer status post left lumpectomy, ER positive CKD Hypertension Left CEA She smoked about a pack per day, 40 pack years before she quit in 2011  On ambulation heart rate increased from 90-1 24 and oxygen saturation dropped mildly from 96 to 94%  Significant tests/ events reviewed  CT chest without contrast 02/2018 BL apical scarring, severe emphysema, subpleural opacity left upper lobe  10/2017 PFTs severe airway obstruction, ratio 47, FEV1 40%, FVC 65%, TLC 169% showing hyperinflation, DLCO 40%  Past Medical History:  Diagnosis Date   Allergy    Atypical pneumonia 09/25/2017   Cancer (HCC)    breast cancer; dx in 04/2018.    Cholesteatoma    Complication of anesthesia    "slow to wake up, couldn't catch my breath"- has had anesthesia since this occurence and did fine.    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    Vitamin D deficiency    Past Surgical History:  Procedure Laterality Date   ABDOMINAL HYSTERECTOMY     APPENDECTOMY     BREAST LUMPECTOMY Left 05/31/2018   BREAST LUMPECTOMY WITH RADIOACTIVE SEED LOCALIZATION Left 05/31/2018   Procedure: LEFT BREAST LUMPECTOMY WITH RADIOACTIVE SEED LOCALIZATION;  Surgeon: Claud Kelp, MD;  Location: [facility] OR;  Service: General;  Laterality: Left;   carotid endoectomy Left    cataract Bilateral 02/27/14   EYE SURGERY     catarat surgery bilaterally    Allergies  Allergen Reactions   Pravastatin     Muscle aches   Zetia [Ezetimibe]     mylagias   Cephalexin Rash    Inside mouth   Sulfa Antibiotics Rash    Including mouth    Social History   Socioeconomic History   Marital status: Widowed    Spouse name: Not on file   Number of children: 2   Years of education: cosemtology certificate   Highest education level: Some college, no degree  Occupational History   Occupation: Retired    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[employer]  Tobacco Use   Smoking status: Former    Packs/day: 1.00    Years:  20.00    Additional pack years: 0.00    Total pack years: 20.00    Types: Cigarettes    Quit date: 08/25/2009    Years since quitting: 12.9   Smokeless tobacco: Never  Vaping Use   Vaping Use: Never used  Substance and Sexual Activity   Alcohol use: No   Drug use: No   Sexual activity: Not Currently  Other Topics Concern   Not on file  Social History Narrative   Lives alone   She exercises everyday and enjoys zumba   Social Determinants of Health   Financial Resource Strain: Low Risk  (07/01/2022)   Overall Financial Resource Strain (CARDIA)    Difficulty of Paying Living Expenses: Not hard at all  Food Insecurity: No Food  Insecurity (07/01/2022)   Hunger Vital Sign    Worried About Running Out of Food in the Last Year: Never true    Ran Out of Food in the Last Year: Never true  Transportation Needs: No Transportation Needs (07/01/2022)   PRAPARE - Administrator, Civil Service (Medical): No    Lack of Transportation (Non-Medical): No  Physical Activity: Sufficiently Active (07/01/2022)   Exercise Vital Sign    Days of Exercise per Week: 5 days    Minutes of Exercise per Session: 60 min  Stress: No Stress Concern Present (07/01/2022)   [employer] of Occupational Health - Occupational Stress Questionnaire    Feeling of Stress : Not at all  Social Connections: Moderately Integrated (07/01/2022)   Social Connection and Isolation Panel [NHANES]    Frequency of Communication with Friends and Family: More than three times a week    Frequency of Social Gatherings with Friends and Family: More than three times a week    Attends Religious Services: More than 4 times per year    Active Member of [employer] or Organizations: Yes    Attends Banker Meetings: More than 4 times per year    Marital Status: Widowed  Intimate Partner Violence: Not At Risk (07/01/2022)   Humiliation, Afraid, Rape, and Kick questionnaire    Fear of Current or Ex-Partner: No    Emotionally Abused: No    Physically Abused: No    Sexually Abused: No    Family History  Problem Relation Age of Onset   Heart disease Mother    Stroke Mother 4   Heart disease Father    Heart attack Father    Hypertension Son    Heart attack Paternal Aunt    Heart disease Paternal Aunt      Review of Systems Constitutional: negative for anorexia, fevers and sweats  Eyes: negative for irritation, redness and visual disturbance  Ears, nose, mouth, throat, and face: negative for earaches, epistaxis, nasal congestion and sore throat   Cardiovascular: negative for chest pain, dyspnea, lower extremity edema, orthopnea, palpitations and  syncope  Gastrointestinal: negative for abdominal pain, constipation, diarrhea, melena, nausea and vomiting  Genitourinary:negative for dysuria, frequency and hematuria  Hematologic/lymphatic: negative for bleeding, easy bruising and lymphadenopathy  Musculoskeletal:negative for arthralgias, muscle weakness and stiff joints  Neurological: negative for coordination problems, gait problems, headaches and weakness  Endocrine: negative for diabetic symptoms including polydipsia, polyuria and weight loss     Objective:   Physical Exam  Gen. Pleasant, elderly, thin woman, in no distress, normal affect ENT - no pallor,icterus, no post nasal drip Neck: No JVD, no thyromegaly, no carotid bruits Lungs: no use of accessory muscles, no dullness to  percussion, decreased breath sounds bilateral without rales or rhonchi  Cardiovascular: Rhythm regular, heart sounds  normal, no murmurs or gallops, no peripheral edema Abdomen: soft and non-tender, no hepatosplenomegaly, BS normal. Musculoskeletal: No deformities, no cyanosis or clubbing Neuro:  alert, non focal       Assessment & Plan:   Abnormal chest x-ray  patchy reticulonodular infiltrate right upper lobe noted on 4/8 and unchanged on 4/29 chest x-ray -we will investigate with CT chest without contrast in 2 to 4 weeks hopefully this is delayed resolution of pneumonia

## 2022-08-06 NOTE — Telephone Encounter (Signed)
Patient had to be scheduled for CT at [facility] imaging due to Insurance not allowing for it to be done with [place] ( costs are too high )   Patients OV in 1 month will need to be resch after 6/15 CT. [facility] first avail was 6/15 and pt is on wait list for sooner appt if open later.   FYI to provider and asking if lindsey can resch f/u   Thanks

## 2022-08-10 NOTE — Telephone Encounter (Signed)
ATC patient. Per DPR, left detailed vm letting patient know I would cancel her 6/14 appointment with Mrs. Cobb and for her to give [place] a call back after she has het CT done to schedule another f/u appointment.   Nothing further needed.

## 2022-08-11 ENCOUNTER — Ambulatory Visit (INDEPENDENT_AMBULATORY_CARE_PROVIDER_SITE_OTHER): Payer: Medicare HMO | Admitting: Pharmacist

## 2022-08-11 DIAGNOSIS — E782 Mixed hyperlipidemia: Secondary | ICD-10-CM

## 2022-08-11 NOTE — Progress Notes (Signed)
This patient is appearing on the insurance-provided list for being at risk of failing the adherence measure for cholesterol medications this calendar year.   Medication: rosuvastatin Last fill date: 05/28/2022 for 90 day supply   Patient is not on RAAS agent for hypertension adherence measure. On amlodipine (also filled 05/28/2022 for 90 day supply).   Jardiance PAP utilized for CKD.   No interventions required at this time.   Valeda Malm, Pharm.D. PGY-2 Ambulatory Care Pharmacy Resident

## 2022-08-14 ENCOUNTER — Telehealth: Payer: Self-pay | Admitting: Pulmonary Disease

## 2022-08-14 MED ORDER — STIOLTO RESPIMAT 2.5-2.5 MCG/ACT IN AERS: 2.0000 | INHALATION_SPRAY | Freq: Every day | RESPIRATORY_TRACT | 11 refills | Status: DC

## 2022-08-14 NOTE — Telephone Encounter (Signed)
Rx for Stiolto was sent to pharm  Pt aware  Nothing further needed

## 2022-09-02 ENCOUNTER — Ambulatory Visit (HOSPITAL_BASED_OUTPATIENT_CLINIC_OR_DEPARTMENT_OTHER): Payer: Medicare HMO | Admitting: Pulmonary Disease

## 2022-09-08 ENCOUNTER — Ambulatory Visit
Admission: RE | Admit: 2022-09-08 | Discharge: 2022-09-08 | Disposition: A | Discharge status: Home / Self Care | Payer: Medicare HMO | Source: Ambulatory Visit | Attending: Pulmonary Disease | Admitting: Pulmonary Disease

## 2022-09-08 DIAGNOSIS — R918 Other nonspecific abnormal finding of lung field: Secondary | ICD-10-CM | POA: Diagnosis not present

## 2022-09-08 DIAGNOSIS — J449 Chronic obstructive pulmonary disease, unspecified: Secondary | ICD-10-CM

## 2022-09-11 ENCOUNTER — Ambulatory Visit: Payer: Medicare HMO | Admitting: Nurse Practitioner

## 2022-09-29 ENCOUNTER — Other Ambulatory Visit: Payer: Self-pay

## 2022-09-29 DIAGNOSIS — J189 Pneumonia, unspecified organism: Secondary | ICD-10-CM

## 2022-10-08 ENCOUNTER — Encounter: Payer: Self-pay | Admitting: Nurse Practitioner

## 2022-10-08 ENCOUNTER — Ambulatory Visit: Payer: Medicare HMO | Admitting: Nurse Practitioner

## 2022-10-08 ENCOUNTER — Telehealth: Payer: Self-pay | Admitting: *Deleted

## 2022-10-08 VITALS — BP 128/60 | HR 94 | Temp 97.6°F | Ht 63.0 in | Wt 99.4 lb

## 2022-10-08 DIAGNOSIS — J189 Pneumonia, unspecified organism: Secondary | ICD-10-CM

## 2022-10-08 DIAGNOSIS — J449 Chronic obstructive pulmonary disease, unspecified: Secondary | ICD-10-CM | POA: Diagnosis not present

## 2022-10-08 NOTE — Telephone Encounter (Signed)
Patient had OV today with Micheline Maze, NP. She stated she was told her next CT chest would not be covered at [facility] (already is scheduled Sept. 2024). Spoke with Lanna Poche, [facility]. She stated it has been approved for [place]. Attempted to call patient to inform her of this information. Left her a voicemail message.

## 2022-10-08 NOTE — Assessment & Plan Note (Signed)
Compensated on current regimen. She will continue LABA/LAMA therapy. Action plan in places. Encouraged to remain active.  Patient Instructions  Continue Albuterol inhaler 2 puffs every 6 hours as needed for shortness of breath or wheezing. Notify if symptoms persist despite rescue inhaler/neb use.  Continue Stiolto 2 puffs daily  Continue guaifenesin 1 tab Twice daily   Repeat CT chest 9/27  Follow up first week in October with Dr. Vassie Loll or Florentina Addison Miela Desjardin,NP to review CT. If symptoms worsen, please contact office for sooner follow up or seek emergency care.

## 2022-10-08 NOTE — Patient Instructions (Addendum)
Continue Albuterol inhaler 2 puffs every 6 hours as needed for shortness of breath or wheezing. Notify if symptoms persist despite rescue inhaler/neb use.  Continue Stiolto 2 puffs daily  Continue guaifenesin 1 tab Twice daily   Repeat CT chest 9/27  Follow up first week in October with Dr. Vassie Loll or Florentina Addison Marquelle Musgrave,NP to review CT. If symptoms worsen, please contact office for sooner follow up or seek emergency care.

## 2022-10-08 NOTE — Assessment & Plan Note (Signed)
Resolving infectious/inflammatory area on CT imaging. Plan is to repeat in September for 3 month follow up. She is clinically improved. Return precautions advised.

## 2022-10-08 NOTE — Progress Notes (Signed)
@Patient  ID: Diane Carlson, female    DOB: 1945/07/09, 77 y.o.   MRN: [id number]  Chief Complaint  Patient presents with   Follow-up    F/u after CT chest, no complaints    Referring provider: Raliegh Ip, DO  HPI: 76 year old female, former smoker followed for abnormal CT of the chest and COPD. She is a patient of Dr. Reginia Naas and last seen in office 08/06/2022. Past medical history significant for HTN, allergic rhinitis, CKD, HLD, left breast cancer s/p left lumpectomy.   TEST/EVENTS:  10/2017 PFT: FVC 65, FEV1 40, ratio 47, TLC 169, DLCO 40. Severe airway obstruction with severe diffusing defect 07/06/2022 CXR: mild infiltrate RUL. Mild bibasilar subsegmental atelectasis and/or scarring. Biapical pleuroparenchymal thickening, consistent with scarring. Possible carotid atherosclerosis.  07/27/2022 CXR: persisting reticulonodular opacities in the RUL 09/08/2022 CT chest wo contrast: trace pericardial effusion. Severe atherosclerosis and CAD. Small hiatal hernia. Stable prominent subcm left periaortic lymph node, 7 mm and likely reactive. Stable biapical pleural-parenchymal scarring. Severe upper lobe predominant centrilobular emphysema. Foal area of interstitial thickening and ground-glass attenuation of RUL. Likely infectious or inflammatory. Stable small solid nodule RLL, 3 mm.   08/06/2022: OV with Dr. Vassie Loll. DOE, relieved by resting, worsened by anxiety and nervousness. Episode of pneumonia 2 years ago then another a month ago. She was treated with abx. Able to walk a mile a day and goes to the gym twice a week. Plan for CT chest in 2-4 weeks. Change Trelegy to Stiolto due to concern for pna from Trelegy.  10/08/2022: Today - follow up Patient presents today for follow up after CT chest which showed resolving pneumonia. Plan is to repeat in September for 3 month follow up. She has been feeling about the same as her last visit. She does feel like the Stiolto helps her breathing and gives  her more stamina. She has an occasional non-productive cough, which is unchanged from baseline. No fevers, chills, hemoptysis, anorexia, weight loss. She doesn't have to use her rescue inhaler.   Allergies  Allergen Reactions   Pravastatin     Muscle aches   Zetia [Ezetimibe]     mylagias   Cephalexin Rash    Inside mouth   Sulfa Antibiotics Rash    Including mouth    Immunization History  Administered Date(s) Administered   Fluad Quad(high Dose 65+) 12/27/2018, 01/23/2020, 01/24/2021, 01/23/2022   Influenza Whole 12/28/2009   Influenza, High Dose Seasonal PF 03/02/2017   Influenza,inj,Quad PF,6+ Mos 01/15/2014, 01/11/2015, 01/07/2016, 02/21/2018   Moderna Sars-Covid-2 Vaccination 05/02/2019, 05/30/2019, 02/28/2020, 09/11/2020   Pneumococcal Conjugate-13 08/31/2014   Pneumococcal Polysaccharide-23 08/25/2012   Respiratory Syncytial Virus Vaccine,Recomb Aduvanted(Arexvy) 01/15/2022   Td 01/02/2022   Tdap 09/04/2010   Zoster Recombinant(Shingrix) 04/08/2021, 07/02/2021   Zoster, Live 03/10/2013    Past Medical History:  Diagnosis Date   Allergy    Atypical pneumonia 09/25/2017   Cancer (HCC)    breast cancer; dx in 04/2018.    Cholesteatoma    Complication of anesthesia    "slow to wake up, couldn't catch my breath"- has had anesthesia since this occurence and did fine.    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    Vitamin D deficiency     Tobacco History: Social History   Tobacco Use  Smoking Status Former   Current packs/day: 0.00   Average packs/day: 1 pack/day for 20.0 years (20.0 ttl pk-yrs)   Types: Cigarettes   Start date: 08/25/1989   Quit date:  08/25/2009   Years since quitting: 13.1  Smokeless Tobacco Never   Counseling given: Not Answered   Outpatient Medications Prior to Visit  Medication Sig Dispense Refill   albuterol (VENTOLIN HFA) 108 (90 Base) MCG/ACT inhaler Inhale 2 puffs into the lungs every 6 (six) hours as needed for wheezing or shortness of breath. 8 g 2   amLODipine (NORVASC) 10 MG tablet Take 1 tablet (10 mg total) by mouth daily. 90 tablet 3   anastrozole (ARIMIDEX) 1 MG tablet Take 1 tablet (1 mg total) by mouth daily. 90 tablet 3   aspirin 81 MG tablet Take 81 mg by mouth daily.     azelastine (ASTELIN) 0.1 % nasal spray Place 1 spray into both nostrils 2 (two) times daily. 30 mL 12   busPIRone (BUSPAR) 5 MG tablet Take 1 tablet (5 mg total) by mouth 2 (two) times daily. For anxiety 60 tablet 0   cholecalciferol (VITAMIN D) 1000 UNITS tablet Take 1,000 Units by mouth daily.     cyanocobalamin (VITAMIN B12) 500 MCG tablet Take 500 mcg by mouth daily.     empagliflozin (JARDIANCE) 25 MG TABS tablet Take by mouth daily.     fluticasone (FLONASE) 50 MCG/ACT nasal spray Place 2 sprays into both nostrils daily. 16 g 6   Garlic 1000 MG CAPS Take 1,000 mg by mouth 2 (two) times daily.      guaiFENesin (MUCINEX) 600 MG 12 hr tablet Take 1 tablet (600 mg total) by mouth 2 (two) times daily. 30 tablet 0   rosuvastatin (CRESTOR) 10 MG tablet Take 1 tablet (10 mg total) by mouth daily. 90 tablet 3   Tiotropium Bromide-Olodaterol (STIOLTO RESPIMAT) 2.5-2.5 MCG/ACT AERS Inhale 2 puffs into the lungs daily. 4 g 11   Zinc Sulfate (ZINC 15 PO) Take by mouth.     benzonatate (TESSALON PERLES) 100 MG capsule Take 1 capsule (100 mg total) by mouth 3 (three) times daily as needed. (Patient not taking: Reported on 10/08/2022) 20 capsule 0   Tiotropium Bromide-Olodaterol (STIOLTO RESPIMAT) 2.5-2.5 MCG/ACT AERS Inhale 2 puffs into the lungs daily. 4 g 0   No facility-administered medications prior to visit.     Review of Systems:    Constitutional: No weight loss or gain, night sweats, fevers, chills, fatigue, or lassitude. HEENT: No headaches, difficulty swallowing, tooth/dental problems, or sore throat. No sneezing, itching, ear ache, nasal congestion, or post nasal drip CV:  No chest pain, orthopnea, PND, swelling in lower extremities, anasarca, dizziness, palpitations, syncope Resp: +shortness of breath with exertion; occasional non-productive cough. No excess mucus or change in color of mucus.  No hemoptysis. No wheezing.  No chest wall deformity GI:  No heartburn, indigestion, abdominal pain, nausea, vomiting, diarrhea, change in bowel habits, loss of appetite, bloody stools.  Skin: No rash, lesions, ulcerations MSK:  No joint pain or swelling.   Neuro: No dizziness or lightheadedness.  Psych: No depression or anxiety. Mood stable.     Physical Exam:  BP 128/60 (BP Location: Right Arm, Cuff Size: Normal)   Pulse 94   Temp 97.6 F (36.4 C) (Temporal)  Ht 5\' 3"  (1.6 m)   Wt 99 lb 6.4 oz (45.1 kg)   SpO2 99%   BMI 17.61 kg/m   GEN: Pleasant, interactive, well-appearing; in no acute distress HEENT:  Normocephalic and atraumatic. PERRLA. Sclera white. Nasal turbinates pink, moist and patent bilaterally. No rhinorrhea present. Oropharynx pink and moist, without exudate or edema. No lesions, ulcerations, or postnasal drip.  NECK:  Supple w/ fair ROM. No JVD present. Normal carotid impulses w/o bruits. Thyroid symmetrical with no goiter or nodules palpated. No lymphadenopathy.   CV: RRR, no m/r/g, no peripheral edema. Pulses intact, +2 bilaterally. No cyanosis, pallor or clubbing. PULMONARY:  Unlabored, regular breathing. Diminished bibasilar airflow otherwise clear bilaterally A&P w/o wheezes/rales/rhonchi. No accessory muscle use.  GI: BS present and normoactive. Soft, non-tender to palpation. No organomegaly or masses detected.  MSK: No erythema, warmth or tenderness. Cap refil <2 sec all extrem. No  deformities or joint swelling noted.  Neuro: A/Ox3. No focal deficits noted.   Skin: Warm, no lesions or rashe Psych: Normal affect and behavior. Judgement and thought content appropriate.     Lab Results:  CBC    Component Value Date/Time   WBC 9.0 01/02/2022 0852   WBC 8.4 05/23/2018 0837   RBC 4.15 01/02/2022 0852   RBC 4.00 05/23/2018 0837   HGB 12.6 01/02/2022 0852   HCT 37.3 01/02/2022 0852   PLT 284 01/02/2022 0852   MCV 90 01/02/2022 0852   MCH 30.4 01/02/2022 0852   MCH 28.8 05/23/2018 0837   MCHC 33.8 01/02/2022 0852   MCHC 31.4 05/23/2018 0837   RDW 13.1 01/02/2022 0852   LYMPHSABS 2.3 06/28/2020 0803   MONOABS 0.7 05/23/2018 0837   EOSABS 0.4 06/28/2020 0803   BASOSABS 0.1 06/28/2020 0803    BMET    Component Value Date/Time   NA 142 01/02/2022 0852   K 5.7 (H) 01/02/2022 0852   CL 102 01/02/2022 0852   CO2 24 01/02/2022 0852   GLUCOSE 100 (H) 01/02/2022 0852   GLUCOSE 104 (H) 05/23/2018 0837   BUN 27 01/02/2022 0852   CREATININE 2.83 (H) 01/02/2022 0852   CREATININE 1.93 (H) 08/25/2012 1036   CALCIUM 10.4 (H) 01/02/2022 0852   GFRNONAA 20 (L) 12/29/2019 0816   GFRNONAA 27 (L) 08/25/2012 1036   GFRAA 24 (L) 12/29/2019 0816   GFRAA 31 (L) 08/25/2012 1036    BNP    Component Value Date/Time   BNP 115.0 (H) 09/25/2017 1152     Imaging:  CT CHEST WO CONTRAST  Result Date: 09/14/2022 CLINICAL DATA:  Abnormal lung opacities EXAM: CT CHEST WITHOUT CONTRAST TECHNIQUE: Multidetector CT imaging of the chest was performed following the standard protocol without IV contrast. RADIATION DOSE REDUCTION: This exam was performed according to the departmental dose-optimization program which includes automated exposure control, adjustment of the mA and/or kV according to patient size and/or use of iterative reconstruction technique. COMPARISON:  Chest x-ray dated July 27, 2022; chest CT dated March 04, 2018 FINDINGS: Cardiovascular: Normal heart size. Trace  pericardial effusion. Severe left main and three-vessel coronary artery calcifications. Normal caliber thoracic aorta with moderate calcified plaque. Mediastinum/Nodes: Small hiatal hernia. Thyroid is unremarkable. Stable prominent subcentimeter left periaortic lymph node measuring 7 mm on series 2, image 113, likely reactive. Pathologically enlarged lymph nodes seen in the chest. Lungs/Pleura: Stable biapical pleural-parenchymal scarring. Central airways are patent. Severe upper lobe predominant centrilobular emphysema. Focal area of interstitial thickening and ground-glass attenuation of the right upper lobe. Stable small solid  pulmonary nodule of the right lower lobe measuring 3 mm on series 8, image 95, considered benign given greater than 1 year stability. No pleural effusion or pneumothorax. Upper Abdomen: No acute abnormality. Musculoskeletal: No chest wall mass or suspicious bone lesions identified. IMPRESSION: 1. Focal area of interstitial thickening and ground-glass attenuation of the right upper lobe correlates with abnormality seen on prior chest radiograph and is new when compared with the prior CT, likely infectious or inflammatory. Follow-up chest CT is recommended in 3 months to ensure resolution. 2. Coronary artery calcifications, aortic Atherosclerosis (ICD10-I70.0) and Emphysema (ICD10-J43.9). Electronically Signed   By: Allegra Lai M.D.   On: 09/14/2022 22:03    Administration History     None          Latest Ref Rng & Units 11/25/2017    8:34 AM  PFT Results  FVC-Pre L 1.86   FVC-Predicted Pre % 65   FVC-Post L 1.94   FVC-Predicted Post % 68   Pre FEV1/FVC % % 47   Post FEV1/FCV % % 48   FEV1-Pre L 0.88   FEV1-Predicted Pre % 40   FEV1-Post L 0.93   DLCO uncorrected ml/min/mmHg 9.21   DLCO UNC% % 40   DLVA Predicted % 61   TLC L 8.32   TLC % Predicted % 169   RV % Predicted % 296     No results found for: "NITRICOXIDE"      Assessment & Plan:   COPD,  severe (HCC) Compensated on current regimen. She will continue LABA/LAMA therapy. Action plan in places. Encouraged to remain active.  Patient Instructions  Continue Albuterol inhaler 2 puffs every 6 hours as needed for shortness of breath or wheezing. Notify if symptoms persist despite rescue inhaler/neb use.  Continue Stiolto 2 puffs daily  Continue guaifenesin 1 tab Twice daily   Repeat CT chest 9/27  Follow up first week in October with Dr. Vassie Loll or Florentina Addison Yadira Hada,NP to review CT. If symptoms worsen, please contact office for sooner follow up or seek emergency care.    Persistent pneumonia Resolving infectious/inflammatory area on CT imaging. Plan is to repeat in September for 3 month follow up. She is clinically improved. Return precautions advised.    I spent 28 minutes of dedicated to the care of this patient on the date of this encounter to include pre-visit review of records, face-to-face time with the patient discussing conditions above, post visit ordering of testing, clinical documentation with the electronic health record, making appropriate referrals as documented, and communicating necessary findings to members of the patients care team.  Noemi Chapel, NP 10/08/2022  Pt aware and understands NP's role.

## 2022-10-09 ENCOUNTER — Telehealth (HOSPITAL_BASED_OUTPATIENT_CLINIC_OR_DEPARTMENT_OTHER): Payer: Self-pay | Admitting: Pulmonary Disease

## 2022-10-09 NOTE — Telephone Encounter (Signed)
Patient states her insurance company let her know that it would be more expensive to have CT done at [facility] on 9/27. Patient would like to schedule at [facility] instead. Please advise and call patient back.

## 2022-10-09 NOTE — Telephone Encounter (Signed)
I have cancelled the [facility] appt and transferred patient to [facility] to get set up

## 2022-10-13 DIAGNOSIS — L603 Nail dystrophy: Secondary | ICD-10-CM | POA: Diagnosis not present

## 2022-10-13 DIAGNOSIS — M79676 Pain in unspecified toe(s): Secondary | ICD-10-CM | POA: Diagnosis not present

## 2022-10-13 DIAGNOSIS — L84 Corns and callosities: Secondary | ICD-10-CM | POA: Diagnosis not present

## 2022-10-13 DIAGNOSIS — E1142 Type 2 diabetes mellitus with diabetic polyneuropathy: Secondary | ICD-10-CM | POA: Diagnosis not present

## 2022-10-19 ENCOUNTER — Other Ambulatory Visit: Payer: Self-pay | Admitting: Family Medicine

## 2022-10-19 DIAGNOSIS — I10 Essential (primary) hypertension: Secondary | ICD-10-CM

## 2022-10-19 DIAGNOSIS — E782 Mixed hyperlipidemia: Secondary | ICD-10-CM

## 2022-10-21 ENCOUNTER — Other Ambulatory Visit: Payer: Self-pay | Admitting: Family Medicine

## 2022-10-21 DIAGNOSIS — E782 Mixed hyperlipidemia: Secondary | ICD-10-CM

## 2022-10-21 DIAGNOSIS — I10 Essential (primary) hypertension: Secondary | ICD-10-CM

## 2022-10-22 ENCOUNTER — Telehealth: Payer: Self-pay | Admitting: Nurse Practitioner

## 2022-10-22 NOTE — Telephone Encounter (Signed)
Patient would like her prescription sent to [facility] pharmacy.  She stated that she can get it free from that pharmacy.  Please advise and call patient to confirm.  CB# [phone] or [phone]

## 2022-10-28 ENCOUNTER — Other Ambulatory Visit: Payer: Self-pay

## 2022-10-28 MED ORDER — STIOLTO RESPIMAT 2.5-2.5 MCG/ACT IN AERS: 2.0000 | INHALATION_SPRAY | Freq: Every day | RESPIRATORY_TRACT | 3 refills | Status: DC

## 2022-10-28 NOTE — Telephone Encounter (Signed)
No medication name mentioned in original message.  Per patient she would like Stiolto rx sent to [facility], as she has $0 copay with them.  Rx sent to pharmacy.

## 2022-12-11 ENCOUNTER — Encounter: Payer: Self-pay | Admitting: Family Medicine

## 2022-12-11 ENCOUNTER — Ambulatory Visit (INDEPENDENT_AMBULATORY_CARE_PROVIDER_SITE_OTHER): Payer: Medicare HMO | Admitting: Family Medicine

## 2022-12-11 VITALS — BP 120/66 | HR 88 | Ht 63.0 in | Wt 100.0 lb

## 2022-12-11 DIAGNOSIS — M109 Gout, unspecified: Secondary | ICD-10-CM

## 2022-12-11 MED ORDER — PREDNISONE 20 MG PO TABS: ORAL_TABLET | ORAL | 0 refills | Status: DC

## 2022-12-11 MED ORDER — METHYLPREDNISOLONE ACETATE 80 MG/ML IJ SUSP
80.0000 mg | Freq: Once | INTRAMUSCULAR | Status: AC
  Administered 2022-12-11: 80 mg via INTRA_ARTICULAR

## 2022-12-11 NOTE — Progress Notes (Signed)
BP 120/66   Pulse 88   Ht 5\' 3"  (1.6 m)   Wt 100 lb (45.4 kg)   SpO2 97%   BMI 17.71 kg/m    Subjective:   Patient ID: Diane Carlson, female    DOB: [birth year], 77 y.o.   MRN: [id number]  HPI: Diane Carlson is a 77 y.o. female presenting on 12/11/2022 for Hand Pain (Right first finger)   HPI Right first finger swelling and pain Patient is coming in today with right first finger swelling and pain specifically about her PIP joint.  She says it started up a few days ago and it has just become bigger and more painful.  She denies any trauma or redness or warmth.  She denies any cuts or sores or anything that brought it up on her.  She says it just came out of nowhere.  She does have a history of gout 1 time in her toe many years ago but not a consistent history of it.  Relevant past medical, surgical, family and social history reviewed and updated as indicated. Interim medical history since our last visit reviewed. Allergies and medications reviewed and updated.  Review of Systems  Constitutional:  Negative for chills and fever.  Eyes:  Negative for visual disturbance.  Respiratory:  Negative for chest tightness and shortness of breath.   Cardiovascular:  Negative for chest pain and leg swelling.  Musculoskeletal:  Positive for arthralgias and joint swelling.  Skin:  Negative for rash.  Neurological:  Negative for light-headedness and headaches.  Psychiatric/Behavioral:  Negative for agitation and behavioral problems.   All other systems reviewed and are negative.   Per HPI unless specifically indicated above   Allergies as of 12/11/2022       Reactions   Pravastatin    Muscle aches   Zetia [ezetimibe]    mylagias   Cephalexin Rash   Inside mouth   Sulfa Antibiotics Rash   Including mouth        Medication List        Accurate as of December 11, 2022 11:02 AM. If you have any questions, ask your nurse or doctor.          albuterol 108 (90 Base)  MCG/ACT inhaler Commonly known as: VENTOLIN HFA Inhale 2 puffs into the lungs every 6 (six) hours as needed for wheezing or shortness of breath.   amLODipine 10 MG tablet Commonly known as: NORVASC TAKE 1 TABLET EVERY DAY   anastrozole 1 MG tablet Commonly known as: ARIMIDEX Take 1 tablet (1 mg total) by mouth daily.   aspirin 81 MG tablet Take 81 mg by mouth daily.   azelastine 0.1 % nasal spray Commonly known as: ASTELIN Place 1 spray into both nostrils 2 (two) times daily.   benzonatate 100 MG capsule Commonly known as: Tessalon Perles Take 1 capsule (100 mg total) by mouth 3 (three) times daily as needed.   busPIRone 5 MG tablet Commonly known as: BUSPAR Take 1 tablet (5 mg total) by mouth 2 (two) times daily. For anxiety   cholecalciferol 1000 units tablet Commonly known as: VITAMIN D Take 1,000 Units by mouth daily.   cyanocobalamin 500 MCG tablet Commonly known as: VITAMIN B12 Take 500 mcg by mouth daily.   empagliflozin 25 MG Tabs tablet Commonly known as: JARDIANCE Take by mouth daily.   fluticasone 50 MCG/ACT nasal spray Commonly known as: FLONASE Place 2 sprays into both nostrils daily.   Garlic 1000 MG Caps Take  1,000 mg by mouth 2 (two) times daily.   guaiFENesin 600 MG 12 hr tablet Commonly known as: Mucinex Take 1 tablet (600 mg total) by mouth 2 (two) times daily.   predniSONE 20 MG tablet Commonly known as: DELTASONE 2 po at same time daily for 5 days Started by: Elige Radon Yojan Paskett   rosuvastatin 10 MG tablet Commonly known as: CRESTOR TAKE 1 TABLET EVERY DAY   Stiolto Respimat 2.5-2.5 MCG/ACT Aers Generic drug: Tiotropium Bromide-Olodaterol Inhale 2 puffs into the lungs daily.   ZINC 15 PO Take by mouth.         Objective:   BP 120/66   Pulse 88   Ht 5\' 3"  (1.6 m)   Wt 100 lb (45.4 kg)   SpO2 97%   BMI 17.71 kg/m   Wt Readings from Last 3 Encounters:  12/11/22 100 lb (45.4 kg)  10/08/22 99 lb 6.4 oz (45.1 kg)   08/06/22 98 lb 4.8 oz (44.6 kg)    Physical Exam Vitals and nursing note reviewed.  Constitutional:      Appearance: Normal appearance.  Musculoskeletal:     Right hand: Swelling and tenderness present. Normal strength. Normal capillary refill.     Comments: The PIP joint in her right index finger is swollen to 3 times normal size, tender to palpation and tender to movement.  No erythema. has a slight warmth to it.  Neurological:     Mental Status: She is alert.       Assessment & Plan:   Problem List Items Addressed This Visit       Other   Gout - Primary (Chronic)   Relevant Medications   methylPREDNISolone acetate (DEPO-MEDROL) injection 80 mg   predniSONE (DELTASONE) 20 MG tablet   Other Relevant Orders   Uric acid  Swollen right index finger, consistent with likely gout.  Will test uric acid levels and give steroids for anti-inflammatory, if not improved after that then may need imaging  Follow up plan: Return if symptoms worsen or fail to improve.  Counseling provided for all of the vaccine components Orders Placed This Encounter  Procedures   Uric acid    Arville Care, MD [facility] 12/11/2022, 11:02 AM

## 2022-12-12 LAB — URIC ACID: Uric Acid: 7.9 mg/dL (ref 3.1–7.9)

## 2022-12-14 NOTE — Progress Notes (Signed)
Patient r/c  

## 2022-12-15 NOTE — Progress Notes (Signed)
Pt called and results were given and patient verbalized understanding.

## 2022-12-22 DIAGNOSIS — L84 Corns and callosities: Secondary | ICD-10-CM | POA: Diagnosis not present

## 2022-12-22 DIAGNOSIS — E1142 Type 2 diabetes mellitus with diabetic polyneuropathy: Secondary | ICD-10-CM | POA: Diagnosis not present

## 2022-12-22 DIAGNOSIS — M79676 Pain in unspecified toe(s): Secondary | ICD-10-CM | POA: Diagnosis not present

## 2022-12-22 DIAGNOSIS — B351 Tinea unguium: Secondary | ICD-10-CM | POA: Diagnosis not present

## 2022-12-23 ENCOUNTER — Ambulatory Visit
Admission: RE | Admit: 2022-12-23 | Discharge: 2022-12-23 | Disposition: A | Discharge status: Home / Self Care | Payer: Medicare HMO | Source: Ambulatory Visit | Attending: Pulmonary Disease | Admitting: Pulmonary Disease

## 2022-12-23 DIAGNOSIS — I2510 Atherosclerotic heart disease of native coronary artery without angina pectoris: Secondary | ICD-10-CM | POA: Diagnosis not present

## 2022-12-23 DIAGNOSIS — J439 Emphysema, unspecified: Secondary | ICD-10-CM | POA: Diagnosis not present

## 2022-12-23 DIAGNOSIS — R918 Other nonspecific abnormal finding of lung field: Secondary | ICD-10-CM | POA: Diagnosis not present

## 2022-12-23 DIAGNOSIS — J189 Pneumonia, unspecified organism: Secondary | ICD-10-CM

## 2022-12-23 DIAGNOSIS — I700 Atherosclerosis of aorta: Secondary | ICD-10-CM | POA: Diagnosis not present

## 2022-12-25 ENCOUNTER — Other Ambulatory Visit: Payer: Medicare HMO

## 2022-12-25 ENCOUNTER — Ambulatory Visit (HOSPITAL_COMMUNITY): Payer: Medicare HMO

## 2022-12-28 ENCOUNTER — Other Ambulatory Visit: Payer: Self-pay | Admitting: Family Medicine

## 2022-12-28 DIAGNOSIS — E782 Mixed hyperlipidemia: Secondary | ICD-10-CM

## 2022-12-28 DIAGNOSIS — I10 Essential (primary) hypertension: Secondary | ICD-10-CM

## 2022-12-29 ENCOUNTER — Ambulatory Visit (INDEPENDENT_AMBULATORY_CARE_PROVIDER_SITE_OTHER): Payer: Medicare HMO | Admitting: Family Medicine

## 2022-12-29 ENCOUNTER — Ambulatory Visit (INDEPENDENT_AMBULATORY_CARE_PROVIDER_SITE_OTHER): Payer: Medicare HMO

## 2022-12-29 ENCOUNTER — Encounter: Payer: Self-pay | Admitting: Family Medicine

## 2022-12-29 ENCOUNTER — Other Ambulatory Visit: Payer: Self-pay | Admitting: Family Medicine

## 2022-12-29 VITALS — BP 128/75 | HR 111 | Temp 97.5°F | Ht 63.0 in | Wt 95.0 lb

## 2022-12-29 DIAGNOSIS — M25561 Pain in right knee: Secondary | ICD-10-CM

## 2022-12-29 MED ORDER — PREDNISONE 20 MG PO TABS
40.0000 mg | ORAL_TABLET | Freq: Every day | ORAL | 0 refills | Status: AC
Start: 2022-12-29 — End: 2023-01-03

## 2022-12-29 NOTE — Progress Notes (Signed)
Subjective:  Patient ID: Diane Carlson, female    DOB: 06-Oct-1945, 77 y.o.   MRN: [id number]  Patient Care Team: Raliegh Ip, DO as PCP - General (Family Medicine) Iran Ouch, MD as PCP - [facility] Cardiology (Cardiology) Arita Miss, MD as Attending Physician (Nephrology) Serena Croissant, MD as Consulting Physician (Hematology and Oncology) Claud Kelp, MD as Consulting Physician (General Surgery) [facility] Danella Maiers, [facility] as Pharmacist (Family Medicine)   Chief Complaint:  Knee Pain (Patient states she moved in the bed yesterday and felt her right knee pop.  )   HPI: Diane Carlson is a 77 y.o. female presenting on 12/29/2022 for Knee Pain (Patient states she moved in the bed yesterday and felt her right knee pop.  )   Right knee pain. States she turned over in bed and felt a pop in her right knee. States she has had intermittent pain since this time. Has tried tylenol with minimal relief.   Knee Pain  The incident occurred 12 to 24 hours ago. The injury mechanism was a twisting injury. The pain is present in the right knee. The quality of the pain is described as aching, burning and shooting. The pain is moderate. The pain has been Intermittent since onset. Pertinent negatives include no inability to bear weight, loss of motion, loss of sensation, muscle weakness, numbness or tingling. She reports no foreign bodies present. The symptoms are aggravated by movement, weight bearing and palpation. She has tried acetaminophen for the symptoms. The treatment provided mild relief.        Relevant past medical, surgical, family, and social history reviewed and updated as indicated.  Allergies and medications reviewed and updated. Data reviewed: Chart in Epic.   Past Medical History:  Diagnosis Date   Allergy    Atypical pneumonia 09/25/2017   Cancer (HCC)    breast cancer; dx in 04/2018.    Cholesteatoma    Complication of anesthesia     "slow to wake up, couldn't catch my breath"- has had anesthesia since this occurence and did fine.    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    Vitamin D deficiency     Past Surgical History:  Procedure Laterality Date   ABDOMINAL HYSTERECTOMY     APPENDECTOMY     BREAST LUMPECTOMY Left 05/31/2018   BREAST LUMPECTOMY WITH RADIOACTIVE SEED LOCALIZATION Left 05/31/2018   Procedure: LEFT BREAST LUMPECTOMY WITH RADIOACTIVE SEED LOCALIZATION;  Surgeon: Claud Kelp, MD;  Location: [facility] OR;  Service: General;  Laterality: Left;   carotid endoectomy Left    cataract Bilateral 02/27/14   EYE SURGERY     catarat surgery bilaterally    Social History   Socioeconomic History   Marital status: Widowed    Spouse name: Not on file   Number of children: 2   Years of education: cosemtology certificate   Highest education level: Some college, no degree  Occupational History   Occupation: Retired    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[employer]  Tobacco Use   Smoking status: Former    Current packs/day:  0.00    Average packs/day: 1 pack/day for 20.0 years (20.0 ttl pk-yrs)    Types: Cigarettes    Start date: 08/25/1989    Quit date: 08/25/2009    Years since quitting: 13.3   Smokeless tobacco: Never  Vaping Use   Vaping status: Never Used  Substance and Sexual Activity   Alcohol use: No   Drug use: No   Sexual activity: Not Currently  Other Topics Concern   Not on file  Social History Narrative   Lives alone   She exercises everyday and enjoys zumba   Social Determinants of Health   Financial Resource Strain: Low Risk  (07/01/2022)   Overall Financial Resource Strain  (CARDIA)    Difficulty of Paying Living Expenses: Not hard at all  Food Insecurity: No Food Insecurity (07/01/2022)   Hunger Vital Sign    Worried About Running Out of Food in the Last Year: Never true    Ran Out of Food in the Last Year: Never true  Transportation Needs: No Transportation Needs (07/01/2022)   PRAPARE - Administrator, Civil Service (Medical): No    Lack of Transportation (Non-Medical): No  Physical Activity: Sufficiently Active (07/01/2022)   Exercise Vital Sign    Days of Exercise per Week: 5 days    Minutes of Exercise per Session: 60 min  Stress: No Stress Concern Present (07/01/2022)   [employer] of Occupational Health - Occupational Stress Questionnaire    Feeling of Stress : Not at all  Social Connections: Moderately Integrated (07/01/2022)   Social Connection and Isolation Panel [NHANES]    Frequency of Communication with Friends and Family: More than three times a week    Frequency of Social Gatherings with Friends and Family: More than three times a week    Attends Religious Services: More than 4 times per year    Active Member of [employer] or Organizations: Yes    Attends Banker Meetings: More than 4 times per year    Marital Status: Widowed  Intimate Partner Violence: Not At Risk (07/01/2022)   Humiliation, Afraid, Rape, and Kick questionnaire    Fear of Current or Ex-Partner: No    Emotionally Abused: No    Physically Abused: No    Sexually Abused: No    Outpatient Encounter Medications as of 12/29/2022  Medication Sig   albuterol (VENTOLIN HFA) 108 (90 Base) MCG/ACT inhaler Inhale 2 puffs into the lungs every 6 (six) hours as needed for wheezing or shortness of breath.   amLODipine (NORVASC) 10 MG tablet TAKE 1 TABLET EVERY DAY   anastrozole (ARIMIDEX) 1 MG tablet Take 1 tablet (1 mg total) by mouth daily.   aspirin 81 MG tablet Take 81 mg by mouth daily.   azelastine (ASTELIN) 0.1 % nasal spray Place 1 spray into both nostrils 2  (two) times daily.   benzonatate (TESSALON PERLES) 100 MG capsule Take 1 capsule (100 mg total) by mouth 3 (three) times daily as needed.   busPIRone (BUSPAR) 5 MG tablet Take 1 tablet (5 mg total) by mouth 2 (two) times daily. For anxiety   cholecalciferol (VITAMIN D) 1000 UNITS tablet Take 1,000 Units by mouth daily.   cyanocobalamin (VITAMIN B12) 500 MCG tablet Take 500 mcg by mouth daily.   empagliflozin (JARDIANCE) 25 MG TABS tablet Take by mouth daily.   fluticasone (FLONASE) 50 MCG/ACT nasal spray Place 2 sprays into both nostrils daily.   Garlic 1000 MG CAPS Take 1,000 mg by mouth  2 (two) times daily.    guaiFENesin (MUCINEX) 600 MG 12 hr tablet Take 1 tablet (600 mg total) by mouth 2 (two) times daily.   predniSONE (DELTASONE) 20 MG tablet Take 2 tablets (40 mg total) by mouth daily with breakfast for 5 days. 2 po daily for 5 days   rosuvastatin (CRESTOR) 10 MG tablet TAKE 1 TABLET EVERY DAY   Tiotropium Bromide-Olodaterol (STIOLTO RESPIMAT) 2.5-2.5 MCG/ACT AERS Inhale 2 puffs into the lungs daily.   Zinc Sulfate (ZINC 15 PO) Take by mouth.   [DISCONTINUED] predniSONE (DELTASONE) 20 MG tablet 2 po at same time daily for 5 days   No facility-administered encounter medications on file as of 12/29/2022.    Allergies  Allergen Reactions   Pravastatin     Muscle aches   Zetia [Ezetimibe]     mylagias   Cephalexin Rash    Inside mouth   Sulfa Antibiotics Rash    Including mouth    Review of Systems  Constitutional:  Negative for activity change, appetite change, chills, diaphoresis, fatigue, fever and unexpected weight change.  HENT: Negative.    Eyes: Negative.   Respiratory:  Negative for cough, chest tightness and shortness of breath.   Cardiovascular:  Negative for chest pain, palpitations and leg swelling.  Gastrointestinal:  Negative for abdominal pain, blood in stool, constipation, diarrhea, nausea and vomiting.  Endocrine: Negative.   Genitourinary:  Negative for  decreased urine volume, difficulty urinating, dysuria, frequency and urgency.  Musculoskeletal:  Positive for arthralgias and joint swelling. Negative for myalgias.  Skin: Negative.   Allergic/Immunologic: Negative.   Neurological:  Negative for dizziness, tingling, numbness and headaches.  Hematological: Negative.   Psychiatric/Behavioral:  Negative for confusion, hallucinations, sleep disturbance and suicidal ideas.   All other systems reviewed and are negative.       Objective:  BP 128/75   Pulse (!) 111   Temp (!) 97.5 F (36.4 C) (Temporal)   Ht 5\' 3"  (1.6 m)   Wt 95 lb (43.1 kg)   SpO2 96%   BMI 16.83 kg/m    Wt Readings from Last 3 Encounters:  12/29/22 95 lb (43.1 kg)  12/11/22 100 lb (45.4 kg)  10/08/22 99 lb 6.4 oz (45.1 kg)    Physical Exam Vitals and nursing note reviewed.  Constitutional:      Appearance: Normal appearance.  HENT:     Head: Normocephalic and atraumatic.     Nose: Nose normal.     Mouth/Throat:     Mouth: Mucous membranes are moist.  Eyes:     Conjunctiva/sclera: Conjunctivae normal.     Pupils: Pupils are equal, round, and reactive to light.  Cardiovascular:     Rate and Rhythm: Normal rate and regular rhythm.     Heart sounds: Normal heart sounds.  Pulmonary:     Effort: Pulmonary effort is normal.  Musculoskeletal:     Right upper leg: Normal.     Right knee: No swelling, deformity, effusion, erythema, ecchymosis, lacerations, bony tenderness or crepitus. Normal range of motion. Tenderness present over the lateral joint line and LCL. No medial joint line, MCL, ACL, PCL or patellar tendon tenderness. No LCL laxity, MCL laxity, ACL laxity or PCL laxity. Normal alignment, normal meniscus and normal patellar mobility. Normal pulse.     Instability Tests: Anterior drawer test negative. Posterior drawer test negative. Anterior Lachman test negative. Medial McMurray test negative and lateral McMurray test negative.     Right lower leg:  Normal.  Skin:  General: Skin is warm and dry.     Capillary Refill: Capillary refill takes less than 2 seconds.  Neurological:     General: No focal deficit present.     Mental Status: She is alert and oriented to person, place, and time.  Psychiatric:        Mood and Affect: Mood normal.        Behavior: Behavior normal.        Thought Content: Thought content normal.        Judgment: Judgment normal.     Results for orders placed or performed in visit on 12/11/22  Uric acid  Result Value Ref Range   Uric Acid 7.9 3.1 - 7.9 mg/dL     X-Ray: right knee: mild medial joint space narrowing. No acute findings. Preliminary x-ray reading by Kari Baars, FNP-C, [facility].   Pertinent labs & imaging results that were available during my care of the patient were reviewed by me and considered in my medical decision making.  Assessment & Plan:  Evy was seen today for knee pain.  Diagnoses and all orders for this visit:  Acute pain of right knee Mild medial joint space narrowing on imaging, will notify radiology if reading differs. LCL tenderness without laxity. Brace applied in office and referral to PT placed. Will burst with steroids as pt can not take NSAIDs due to renal function. Pt to follow up in 6-8 weeks for reevaluation, sooner if warranted.  -     DG Knee Complete 4 Views Right; Future -     Ambulatory referral to Physical Therapy -     predniSONE (DELTASONE) 20 MG tablet; Take 2 tablets (40 mg total) by mouth daily with breakfast for 5 days. 2 po daily for 5 days     Continue all other maintenance medications.  Follow up plan: Return in about 6 weeks (around 02/09/2023), or if symptoms worsen or fail to improve, for knee pain.   Continue healthy lifestyle choices, including diet (rich in fruits, vegetables, and lean proteins, and low in salt and simple carbohydrates) and exercise (at least 30 minutes of moderate physical activity daily).  Educational handout given for  knee pain  The above assessment and management plan was discussed with the patient. The patient verbalized understanding of and has agreed to the management plan. Patient is aware to call the clinic if they develop any new symptoms or if symptoms persist or worsen. Patient is aware when to return to the clinic for a follow-up visit. Patient educated on when it is appropriate to go to the emergency department.   Kari Baars, FNP-C [facility] [phone]

## 2022-12-30 ENCOUNTER — Other Ambulatory Visit: Payer: Self-pay

## 2022-12-30 ENCOUNTER — Telehealth: Payer: Self-pay | Admitting: Family Medicine

## 2022-12-30 DIAGNOSIS — I10 Essential (primary) hypertension: Secondary | ICD-10-CM

## 2022-12-30 DIAGNOSIS — E782 Mixed hyperlipidemia: Secondary | ICD-10-CM

## 2022-12-30 DIAGNOSIS — E559 Vitamin D deficiency, unspecified: Secondary | ICD-10-CM

## 2022-12-30 DIAGNOSIS — N184 Chronic kidney disease, stage 4 (severe): Secondary | ICD-10-CM

## 2022-12-31 ENCOUNTER — Ambulatory Visit (INDEPENDENT_AMBULATORY_CARE_PROVIDER_SITE_OTHER): Payer: Medicare HMO

## 2022-12-31 ENCOUNTER — Other Ambulatory Visit: Payer: Medicare HMO

## 2022-12-31 ENCOUNTER — Encounter: Payer: Self-pay | Admitting: Family

## 2022-12-31 ENCOUNTER — Ambulatory Visit (INDEPENDENT_AMBULATORY_CARE_PROVIDER_SITE_OTHER): Payer: Medicare HMO | Admitting: Family

## 2022-12-31 VITALS — BP 149/81 | HR 121 | Temp 98.7°F | Ht 63.0 in | Wt 97.6 lb

## 2022-12-31 DIAGNOSIS — E782 Mixed hyperlipidemia: Secondary | ICD-10-CM

## 2022-12-31 DIAGNOSIS — B9689 Other specified bacterial agents as the cause of diseases classified elsewhere: Secondary | ICD-10-CM | POA: Diagnosis not present

## 2022-12-31 DIAGNOSIS — R0602 Shortness of breath: Secondary | ICD-10-CM | POA: Diagnosis not present

## 2022-12-31 DIAGNOSIS — R059 Cough, unspecified: Secondary | ICD-10-CM | POA: Diagnosis not present

## 2022-12-31 DIAGNOSIS — J441 Chronic obstructive pulmonary disease with (acute) exacerbation: Secondary | ICD-10-CM

## 2022-12-31 DIAGNOSIS — N184 Chronic kidney disease, stage 4 (severe): Secondary | ICD-10-CM | POA: Diagnosis not present

## 2022-12-31 DIAGNOSIS — R0981 Nasal congestion: Secondary | ICD-10-CM

## 2022-12-31 DIAGNOSIS — J208 Acute bronchitis due to other specified organisms: Secondary | ICD-10-CM

## 2022-12-31 DIAGNOSIS — R002 Palpitations: Secondary | ICD-10-CM

## 2022-12-31 DIAGNOSIS — E559 Vitamin D deficiency, unspecified: Secondary | ICD-10-CM

## 2022-12-31 DIAGNOSIS — I10 Essential (primary) hypertension: Secondary | ICD-10-CM

## 2022-12-31 DIAGNOSIS — J449 Chronic obstructive pulmonary disease, unspecified: Secondary | ICD-10-CM | POA: Diagnosis not present

## 2022-12-31 LAB — VERITOR FLU A/B WAIVED
Influenza A: NEGATIVE
Influenza B: NEGATIVE

## 2022-12-31 MED ORDER — DOXYCYCLINE HYCLATE 100 MG PO TABS
100.0000 mg | ORAL_TABLET | Freq: Two times a day (BID) | ORAL | 0 refills | Status: DC
Start: 2022-12-31 — End: 2023-04-14

## 2022-12-31 NOTE — Progress Notes (Signed)
Subjective:    Patient ID: Diane Carlson, female    DOB: 12/10/1945, 77 y.o.   MRN: [id number]  Chief Complaint  Patient presents with   Sinusitis   Pt presents to the office today with complaints of sinus issues. She states she took some prednisone this morning and since has had palpitations and feeling SOB. Denies any chest pain.  Sinusitis This is a new problem. The current episode started yesterday. The problem has been gradually worsening since onset. There has been no fever. Her pain is at a severity of 2/10. The pain is mild. Associated symptoms include congestion, coughing, headaches, shortness of breath, sinus pressure and sneezing. Pertinent negatives include no sore throat. Treatments tried: prednisone. The treatment provided mild relief.      Review of Systems  HENT:  Positive for congestion, sinus pressure and sneezing. Negative for sore throat.   Respiratory:  Positive for cough and shortness of breath.   Neurological:  Positive for headaches.  All other systems reviewed and are negative.      Objective:   Physical Exam Vitals reviewed.  Constitutional:      General: She is not in acute distress.    Appearance: She is well-developed.  HENT:     Head: Normocephalic and atraumatic.     Right Ear: External ear normal.  Eyes:     Pupils: Pupils are equal, round, and reactive to light.  Neck:     Thyroid: No thyromegaly.  Cardiovascular:     Rate and Rhythm: Regular rhythm. Tachycardia present.     Heart sounds: Normal heart sounds. No murmur heard. Pulmonary:     Effort: Pulmonary effort is normal. No respiratory distress.     Breath sounds: Wheezing present.  Abdominal:     General: Bowel sounds are normal. There is no distension.     Palpations: Abdomen is soft.     Tenderness: There is no abdominal tenderness.  Musculoskeletal:        General: No tenderness. Normal range of motion.     Cervical back: Normal range of motion and neck supple.  Skin:     General: Skin is warm and dry.  Neurological:     Mental Status: She is alert and oriented to person, place, and time.     Cranial Nerves: No cranial nerve deficit.     Deep Tendon Reflexes: Reflexes are normal and symmetric.  Psychiatric:        Behavior: Behavior normal.        Thought Content: Thought content normal.        Judgment: Judgment normal.      BP (!) 149/81   Pulse (!) 129   Temp 98.7 F (37.1 C) (Temporal)   Ht 5\' 3"  (1.6 m)   Wt 97 lb 9.6 oz (44.3 kg)   SpO2 92%   BMI 17.29 kg/m      Assessment & Plan:  Diane Carlson comes in today with chief complaint of Sinusitis   Diagnosis and orders addressed:  1. Nasal congestion - Novel Coronavirus, NAA ([facility]) - Veritor Flu A/B Waived - DG Chest 2 View  2. Palpitations - EKG 12-Lead  3. SOB (shortness of breath) - DG Chest 2 View  4. Acute bacterial bronchitis - doxycycline (VIBRA-TABS) 100 MG tablet; Take 1 tablet (100 mg total) by mouth 2 (two) times daily.  Dispense: 20 tablet; Refill: 0  5. COPD exacerbation (HCC)   Start doxycycline Chest x-ray pending  Pt will stop  prednisone as it is giving her palpations Keep follow up with Pulmonologist and PCP If her tachycardia continues may need Cardiologists referral.    Diane Rodney, FNP

## 2022-12-31 NOTE — Patient Instructions (Signed)
Community-Acquired Pneumonia, Adult Pneumonia is a lung infection that causes inflammation and the buildup of mucus and fluids in the lungs. This may cause coughing and difficulty breathing. Community-acquired pneumonia is pneumonia that develops in people who are not, and have not recently been, in a hospital or other health care facility. Usually, pneumonia develops as a result of an illness that is caused by a virus, such as the common cold and the flu (influenza). It can also be caused by bacteria or fungi. While the common cold and influenza can pass from person to person (are contagious), pneumonia itself is not considered contagious. What are the causes? This condition may be caused by: Viruses. Bacteria. Fungi. What increases the risk? The following factors may make you more likely to develop this condition: Being over age 65 or having certain medical conditions, such as: A long-term (chronic) disease, such as: chronic obstructive pulmonary disease (COPD), asthma, heart failure, diabetes, or kidney disease. A condition that increases the risk of breathing in (aspirating) mucus and other fluids from your mouth and nose. A weakened body defense system (immune system). Having had your spleen removed (splenectomy). The spleen is the organ that helps fight germs and infections. Not cleaning your teeth and gums well (poor dental hygiene). Using tobacco products. Traveling to places where germs that cause pneumonia are present or being near certain animals or animal habitats that could have germs that cause pneumonia. What are the signs or symptoms? Symptoms of this condition include: A dry cough or a wet (productive) cough. A fever, sweating, or chills. Chest pain, especially when breathing deeply or coughing. Fast breathing, difficulty breathing, or shortness of breath. Tiredness (fatigue) and muscle aches. How is this diagnosed? This condition may be diagnosed based on your medical  history or a physical exam. You may also have tests, including: Imaging, such as a chest X-ray or lung ultrasound. Tests of: The level of oxygen and other gases in your blood. Mucus from your lungs (sputum). Fluid around your lungs (pleural fluid). Your urine. How is this treated? Treatment for this condition depends on many factors, such as the cause of your pneumonia, your medicines, and other medical conditions that you have. For most adults, pneumonia may be treated at home. In some cases, treatment must happen in a hospital and may include: Medicines that are given by mouth (orally) or through an IV, including: Antibiotic medicines, if bacteria caused the pneumonia. Medicines that kill viruses (antiviral medicines), if a virus caused the pneumonia. Oxygen therapy. Severe pneumonia, although rare, may require the following treatments: Mechanical ventilation.This procedure uses a machine to help you breathe if you cannot breathe well on your own or maintain a safe level of blood oxygen. Thoracentesis. This procedure removes any buildup of pleural fluid to help with breathing. Follow these instructions at home:  Medicines Take over-the-counter and prescription medicines only as told by your health care provider. Take cough medicine only if you have trouble sleeping. Cough medicine can prevent your body from removing mucus from your lungs. If you were prescribed antibiotics, take them as told by your health care provider. Do not stop taking the antibiotic even if you start to feel better. Lifestyle     Do not drink alcohol. Do not use any products that contain nicotine or tobacco. These products include cigarettes, chewing tobacco, and vaping devices, such as e-cigarettes. If you need help quitting, ask your health care provider. Eat a healthy diet. This includes plenty of vegetables, fruits, whole grains, low-fat   dairy products, and lean protein. General instructions Rest a lot and  get at least 8 hours of sleep each night. Sleep in a partly upright position at night. Place a few pillows under your head or sleep in a reclining chair. Return to your normal activities as told by your health care provider. Ask your health care provider what activities are safe for you. Drink enough fluid to keep your urine pale yellow. This helps to thin the mucus in your lungs. If your throat is sore, gargle with a mixture of salt and water 3-4 times a day or as needed. To make salt water, completely dissolve -1 tsp (3-6 g) of salt in 1 cup (237 mL) of warm water. Keep all follow-up visits. How is this prevented? You can lower your risk of developing community-acquired pneumonia by: Getting the pneumonia vaccine. There are different types and schedules of pneumonia vaccines. Ask your health care provider which option is best for you. Consider getting the pneumonia vaccine if: You are older than 76 years of age. You are 19-65 years of age and are receiving cancer treatment, have chronic lung disease, or have other medical conditions that affect your immune system. Ask your health care provider if this applies to you. Getting your influenza vaccine every year. Ask your health care provider which type of vaccine is best for you. Getting regular dental checkups. Washing your hands often with soap and water for at least 20 seconds. If soap and water are not available, use hand sanitizer. Contact a health care provider if: You have a fever. You have trouble sleeping because you cannot control your cough with cough medicine. Get help right away if: Your shortness of breath becomes worse. Your chest pain increases. Your sickness becomes worse, especially if you are an older adult or have a weak immune system. You cough up blood. These symptoms may be an emergency. Get help right away. Call 911. Do not wait to see if the symptoms will go away. Do not drive yourself to the  hospital. Summary Pneumonia is an infection of the lungs. Community-acquired pneumonia develops in people who have not been in the hospital. It can be caused by bacteria, viruses, or fungi. This condition may be treated with antibiotics or antiviral medicines. Severe pneumonia may require a hospital stay and treatment to help with breathing. This information is not intended to replace advice given to you by your health care provider. Make sure you discuss any questions you have with your health care provider. Document Revised: 05/14/2021 Document Reviewed: 05/14/2021 Elsevier Patient Education  2024 [employer]  

## 2023-01-01 ENCOUNTER — Telehealth (INDEPENDENT_AMBULATORY_CARE_PROVIDER_SITE_OTHER): Payer: Medicare HMO | Admitting: Nurse Practitioner

## 2023-01-01 ENCOUNTER — Encounter: Payer: Self-pay | Admitting: Nurse Practitioner

## 2023-01-01 DIAGNOSIS — Z87891 Personal history of nicotine dependence: Secondary | ICD-10-CM | POA: Diagnosis not present

## 2023-01-01 DIAGNOSIS — R9389 Abnormal findings on diagnostic imaging of other specified body structures: Secondary | ICD-10-CM | POA: Insufficient documentation

## 2023-01-01 DIAGNOSIS — B9689 Other specified bacterial agents as the cause of diseases classified elsewhere: Secondary | ICD-10-CM

## 2023-01-01 DIAGNOSIS — J019 Acute sinusitis, unspecified: Secondary | ICD-10-CM | POA: Diagnosis not present

## 2023-01-01 DIAGNOSIS — J449 Chronic obstructive pulmonary disease, unspecified: Secondary | ICD-10-CM | POA: Diagnosis not present

## 2023-01-01 DIAGNOSIS — R911 Solitary pulmonary nodule: Secondary | ICD-10-CM

## 2023-01-01 LAB — CBC WITH DIFFERENTIAL/PLATELET
Basophils Absolute: 0 10*3/uL (ref 0.0–0.2)
Basos: 0 %
EOS (ABSOLUTE): 0 10*3/uL (ref 0.0–0.4)
Eos: 0 %
Hematocrit: 34.7 % (ref 34.0–46.6)
Hemoglobin: 11.2 g/dL (ref 11.1–15.9)
Immature Grans (Abs): 0 10*3/uL (ref 0.0–0.1)
Immature Granulocytes: 0 %
Lymphocytes Absolute: 1 10*3/uL (ref 0.7–3.1)
Lymphs: 6 %
MCH: 30.6 pg (ref 26.6–33.0)
MCHC: 32.3 g/dL (ref 31.5–35.7)
MCV: 95 fL (ref 79–97)
Monocytes Absolute: 1.6 10*3/uL — ABNORMAL HIGH (ref 0.1–0.9)
Monocytes: 9 %
Neutrophils Absolute: 15.4 10*3/uL — ABNORMAL HIGH (ref 1.4–7.0)
Neutrophils: 85 %
Platelets: 239 10*3/uL (ref 150–450)
RBC: 3.66 x10E6/uL — ABNORMAL LOW (ref 3.77–5.28)
RDW: 13.4 % (ref 11.7–15.4)
WBC: 18.1 10*3/uL — ABNORMAL HIGH (ref 3.4–10.8)

## 2023-01-01 LAB — CMP14+EGFR
ALT: 17 [IU]/L (ref 0–32)
AST: 18 [IU]/L (ref 0–40)
Albumin: 4.3 g/dL (ref 3.8–4.8)
Alkaline Phosphatase: 124 [IU]/L — ABNORMAL HIGH (ref 44–121)
BUN/Creatinine Ratio: 15 (ref 12–28)
BUN: 38 mg/dL — ABNORMAL HIGH (ref 8–27)
Bilirubin Total: 0.3 mg/dL (ref 0.0–1.2)
CO2: 20 mmol/L (ref 20–29)
Calcium: 9.7 mg/dL (ref 8.7–10.3)
Chloride: 104 mmol/L (ref 96–106)
Creatinine, Ser: 2.49 mg/dL — ABNORMAL HIGH (ref 0.57–1.00)
Globulin, Total: 2.5 g/dL (ref 1.5–4.5)
Glucose: 99 mg/dL (ref 70–99)
Potassium: 4.3 mmol/L (ref 3.5–5.2)
Sodium: 141 mmol/L (ref 134–144)
Total Protein: 6.8 g/dL (ref 6.0–8.5)
eGFR: 20 mL/min/{1.73_m2} — ABNORMAL LOW (ref >59)

## 2023-01-01 LAB — TSH: TSH: 0.324 u[IU]/mL — ABNORMAL LOW (ref 0.450–4.500)

## 2023-01-01 LAB — VITAMIN D 25 HYDROXY (VIT D DEFICIENCY, FRACTURES): Vit D, 25-Hydroxy: 48.4 ng/mL (ref 30.0–100.0)

## 2023-01-01 LAB — LIPID PANEL
Chol/HDL Ratio: 1.7 {ratio} (ref 0.0–4.4)
Cholesterol, Total: 145 mg/dL (ref 100–199)
HDL: 84 mg/dL (ref >39)
LDL Chol Calc (NIH): 42 mg/dL (ref 0–99)
Triglycerides: 109 mg/dL (ref 0–149)
VLDL Cholesterol Cal: 19 mg/dL (ref 5–40)

## 2023-01-01 LAB — NOVEL CORONAVIRUS, NAA: SARS-CoV-2, NAA: NOT DETECTED

## 2023-01-01 NOTE — Patient Instructions (Signed)
Continue Albuterol inhaler 2 puffs every 6 hours as needed for shortness of breath or wheezing. Notify if symptoms persist despite rescue inhaler/neb use.  Continue Stiolto 2 puffs daily  Continue guaifenesin 1 tab Twice daily   Complete doxycycline and prednisone as prescribed.   Repeat CT chest 6 months    Follow up in 3 months with Dr. Vassie Loll or Florentina Addison Cartina Brousseau,NP. If symptoms worsen, please contact office for sooner follow up or seek emergency care.

## 2023-01-01 NOTE — Assessment & Plan Note (Signed)
On empiric doxycycline started by PCP 10/3.  See above.

## 2023-01-01 NOTE — Assessment & Plan Note (Addendum)
Severe COPD.  Having issues with slight increase in cough related to acute sinusitis.  Currently on empiric doxycycline.  She is also taking prednisone for acute right knee pain.  Overall respiratory symptoms seem to be stable to improving.  Advised her to monitor closely and notify of any worsening.  Strict return/ED precautions reviewed.  She will continue her current bronchodilator regimen.  Continue mucociliary clearance therapies and supportive care measures.  Action plan in place.  Patient Instructions  Continue Albuterol inhaler 2 puffs every 6 hours as needed for shortness of breath or wheezing. Notify if symptoms persist despite rescue inhaler/neb use.  Continue Stiolto 2 puffs daily  Continue guaifenesin 1 tab Twice daily   Complete doxycycline and prednisone as prescribed.   Repeat CT chest 6 months    Follow up in 3 months with Dr. Vassie Loll or Florentina Addison Pocahontas Cohenour,NP. If symptoms worsen, please contact office for sooner follow up or seek emergency care.

## 2023-01-01 NOTE — Assessment & Plan Note (Signed)
Unchanged right upper lobe thickening and groundglass attenuation.  Given review his lung infections, suspect this is likely scarring.  However unable to entirely rule out neoplasm so will obtain repeat CT chest in 6 months.

## 2023-01-01 NOTE — Progress Notes (Signed)
Patient ID: Diane Carlson, female     DOB: 07/01/1945, 77 y.o.      MRN: [id number]  Chief Complaint  Patient presents with   Follow-up    CT 12/23/2022 Breathing has been good    Virtual Visit via Video Note  I connected with Diane Carlson on 01/01/23 at  4:00 PM EDT by a video enabled telemedicine application and verified that I am speaking with the correct person using two identifiers.  Location: Patient: Home Provider: Office   I discussed the limitations of evaluation and management by telemedicine and the availability of in person appointments. The patient expressed understanding and agreed to proceed.  History of Present Illness: 77 year old female, former smoker followed for abnormal CT of the chest and COPD. She is a patient of Dr. Reginia Naas and last seen in office 08/06/2022. Past medical history significant for HTN, allergic rhinitis, CKD, HLD, left breast cancer s/p left lumpectomy.    TEST/EVENTS:  10/2017 PFT: FVC 65, FEV1 40, ratio 47, TLC 169, DLCO 40. Severe airway obstruction with severe diffusing defect 07/06/2022 CXR: mild infiltrate RUL. Mild bibasilar subsegmental atelectasis and/or scarring. Biapical pleuroparenchymal thickening, consistent with scarring. Possible carotid atherosclerosis.  07/27/2022 CXR: persisting reticulonodular opacities in the RUL 09/08/2022 CT chest wo contrast: trace pericardial effusion. Severe atherosclerosis and CAD. Small hiatal hernia. Stable prominent subcm left periaortic lymph node, 7 mm and likely reactive. Stable biapical pleural-parenchymal scarring. Severe upper lobe predominant centrilobular emphysema. Foal area of interstitial thickening and ground-glass attenuation of RUL. Likely infectious or inflammatory. Stable small solid nodule RLL, 3 mm.    08/06/2022: OV with Dr. Vassie Loll. DOE, relieved by resting, worsened by anxiety and nervousness. Episode of pneumonia 2 years ago then another a month ago. She was treated with abx. Able to  walk a mile a day and goes to the gym twice a week. Plan for CT chest in 2-4 weeks. Change Trelegy to Stiolto due to concern for pna from Trelegy.   10/08/2022: OV with Manolito Jurewicz NP for follow up after CT chest which showed resolving pneumonia. Plan is to repeat in September for 3 month follow up. She has been feeling about the same as her last visit. She does feel like the Stiolto helps her breathing and gives her more stamina. She has an occasional non-productive cough, which is unchanged from baseline. No fevers, chills, hemoptysis, anorexia, weight loss. She doesn't have to use her rescue inhaler.  01/01/2023: Today - follow up Patient presents today for follow-up via virtual visit with her granddaughter.  Breathing has been doing okay since she was here last.  She has had symptoms of a sinus infection with nasal congestion and purulent drainage.  She is having a slight increase in her cough as well.  She was started on doxycycline yesterday by her PCP.  She is already on prednisone with 1 day left before right knee pain, which was started on 10/1.  Not having any issues with chest congestion, wheezing, increased shortness of breath, leg swelling, orthopnea.  No fevers or chills.  No hemoptysis.  She is not using her rescue inhaler.  Continues on her Stiolto.  Sometimes leaves a funny taste in her mouth but seems to be helping with her breathing.  She had a chest x-ray yesterday that showed persistent but improved reticular nodular interstitial thickening in right upper lobe.  She also had her CT chest completed which showed unchanged biapical pleural-parenchymal scarring and focal area of interstitial thickening and  groundglass attenuation of right upper lobe.  This was overall stable compared to previous imaging.  Suspected to be possible scarring but unable to rule out underlying neoplasm.  Recommended repeat CT in 6 months.  She has not had any issues with weight loss or anorexia.  Allergies  Allergen  Reactions   Pravastatin     Muscle aches   Zetia [Ezetimibe]     mylagias   Cephalexin Rash    Inside mouth   Sulfa Antibiotics Rash    Including mouth   Immunization History  Administered Date(s) Administered   Fluad Quad(high Dose 65+) 12/27/2018, 01/23/2020, 01/24/2021, 01/23/2022   Influenza Whole 12/28/2009   Influenza, High Dose Seasonal PF 03/02/2017   Influenza,inj,Quad PF,6+ Mos 01/15/2014, 01/11/2015, 01/07/2016, 02/21/2018   Moderna Sars-Covid-2 Vaccination 05/02/2019, 05/30/2019, 02/28/2020, 09/11/2020   Pneumococcal Conjugate-13 08/31/2014   Pneumococcal Polysaccharide-23 08/25/2012   Respiratory Syncytial Virus Vaccine,Recomb Aduvanted(Arexvy) 01/15/2022   Td 01/02/2022   Tdap 09/04/2010   Zoster Recombinant(Shingrix) 04/08/2021, 07/02/2021   Zoster, Live 03/10/2013   Past Medical History:  Diagnosis Date   Allergy    Atypical pneumonia 09/25/2017   Cancer (HCC)    breast cancer; dx in 04/2018.    Cholesteatoma    Complication of anesthesia    "slow to wake up, couldn't catch my breath"- has had anesthesia since this occurence and did fine.    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    Vitamin D deficiency     Tobacco History: Social History   Tobacco Use  Smoking Status Former   Current packs/day: 0.00   Average packs/day: 1 pack/day for 20.0 years (20.0 ttl pk-yrs)   Types: Cigarettes   Start date: 08/25/1989   Quit date: 08/25/2009   Years since quitting: 13.3  Smokeless Tobacco Never   Counseling given: Not Answered   Outpatient Medications Prior to Visit  Medication Sig Dispense Refill   albuterol (VENTOLIN HFA) 108 (90 Base)  MCG/ACT inhaler Inhale 2 puffs into the lungs every 6 (six) hours as needed for wheezing or shortness of breath. 8 g 2   amLODipine (NORVASC) 10 MG tablet TAKE 1 TABLET EVERY DAY 90 tablet 0   anastrozole (ARIMIDEX) 1 MG tablet Take 1 tablet (1 mg total) by mouth daily. 90 tablet 3   Ascorbic Acid (VITAMIN C) 1000 MG tablet Take 1,000 mg by mouth daily.     aspirin 81 MG tablet Take 81 mg by mouth daily.     azelastine (ASTELIN) 0.1 % nasal spray Place 1 spray into both nostrils 2 (two) times daily. 30 mL 12   cholecalciferol (VITAMIN D) 1000 UNITS tablet Take 1,000 Units by mouth daily.     cyanocobalamin (VITAMIN B12) 500 MCG tablet Take 500 mcg by mouth daily.     doxycycline (VIBRA-TABS) 100 MG tablet Take 1 tablet (100 mg total) by mouth 2 (two) times daily. 20 tablet 0   ELDERBERRY PO Take by mouth.     empagliflozin (JARDIANCE) 25 MG TABS tablet Take by mouth daily.     fluticasone (FLONASE) 50 MCG/ACT nasal spray Place 2 sprays into both nostrils  daily. 16 g 6   Garlic 1000 MG CAPS Take 1,000 mg by mouth 2 (two) times daily.      guaiFENesin (MUCINEX) 600 MG 12 hr tablet Take 1 tablet (600 mg total) by mouth 2 (two) times daily. 30 tablet 0   rosuvastatin (CRESTOR) 10 MG tablet TAKE 1 TABLET EVERY DAY 90 tablet 0   Tiotropium Bromide-Olodaterol (STIOLTO RESPIMAT) 2.5-2.5 MCG/ACT AERS Inhale 2 puffs into the lungs daily. 12 each 3   TURMERIC PO Take by mouth.     Zinc Sulfate (ZINC 15 PO) Take by mouth.     benzonatate (TESSALON PERLES) 100 MG capsule Take 1 capsule (100 mg total) by mouth 3 (three) times daily as needed. (Patient not taking: Reported on 01/01/2023) 20 capsule 0   busPIRone (BUSPAR) 5 MG tablet Take 1 tablet (5 mg total) by mouth 2 (two) times daily. For anxiety (Patient not taking: Reported on 01/01/2023) 60 tablet 0   predniSONE (DELTASONE) 20 MG tablet Take 2 tablets (40 mg total) by mouth daily with breakfast for 5 days. 2 po daily for 5 days (Patient not taking:  Reported on 01/01/2023) 10 tablet 0   No facility-administered medications prior to visit.     Review of Systems:   Constitutional: No weight loss or gain, night sweats, fevers, chills, fatigue, or lassitude. HEENT: No headaches, difficulty swallowing, tooth/dental problems, or sore throat. No sneezing, itching, ear ache. +nasal congestion/purulent drainage, post nasal drip CV:  No chest pain, orthopnea, PND, swelling in lower extremities, anasarca, dizziness, palpitations, syncope Resp: +baseline shortness of breath with exertion; acute cough with minimal sputum production. No excess mucus or change in color of mucus.  No hemoptysis. No wheezing.  No chest wall deformity GI:  No heartburn, indigestion, abdominal pain, nausea, vomiting, diarrhea, change in bowel habits, loss of appetite, bloody stools.  GU: No dysuria, change in color of urine, urgency or frequency.  No flank pain, no hematuria  Skin: No rash, lesions, ulcerations MSK:  +right knee pain (improving)  Neuro: No dizziness or lightheadedness.  Psych: No depression or anxiety. Mood stable.   Observations/Objective: Patient is well-developed, well-nourished in no acute distress. A&Ox3. Resting comfortably at home. Unlabored breathing. Speech is clear and coherent with logical content.    Assessment and Plan: COPD, severe (HCC) Severe COPD.  Having issues with slight increase in cough related to acute sinusitis.  Currently on empiric doxycycline.  She is also taking prednisone for acute right knee pain.  Overall respiratory symptoms seem to be stable to improving.  Advised her to monitor closely and notify of any worsening.  Strict return/ED precautions reviewed.  She will continue her current bronchodilator regimen.  Continue mucociliary clearance therapies and supportive care measures.  Action plan in place.  Patient Instructions  Continue Albuterol inhaler 2 puffs every 6 hours as needed for shortness of breath or wheezing.  Notify if symptoms persist despite rescue inhaler/neb use.  Continue Stiolto 2 puffs daily  Continue guaifenesin 1 tab Twice daily   Complete doxycycline and prednisone as prescribed.   Repeat CT chest 6 months    Follow up in 3 months with Dr. Vassie Loll or Florentina Addison Sharrell Krawiec,NP. If symptoms worsen, please contact office for sooner follow up or seek emergency care.    Acute bacterial rhinosinusitis On empiric doxycycline started by PCP 10/3.  See above.  Abnormal CT of the chest Unchanged right upper lobe thickening and groundglass attenuation.  Given review his lung infections, suspect this is likely scarring.  However unable to entirely rule out neoplasm so will obtain repeat CT chest in 6 months.     I discussed the assessment and treatment plan with the patient. The patient was provided an opportunity to ask questions and all were answered. The patient agreed with the plan and demonstrated an understanding of the instructions.   The patient was advised to call back or seek an in-person evaluation if the symptoms worsen or if the condition fails to improve as anticipated.  I provided 31 minutes of non-face-to-face time during this encounter.   Noemi Chapel, NP

## 2023-01-04 ENCOUNTER — Ambulatory Visit (INDEPENDENT_AMBULATORY_CARE_PROVIDER_SITE_OTHER): Payer: Medicare HMO | Admitting: Family Medicine

## 2023-01-04 ENCOUNTER — Encounter: Payer: Self-pay | Admitting: Family Medicine

## 2023-01-04 ENCOUNTER — Other Ambulatory Visit: Payer: Medicare HMO

## 2023-01-04 VITALS — BP 135/77 | HR 118 | Temp 98.4°F | Ht 63.0 in | Wt 96.8 lb

## 2023-01-04 DIAGNOSIS — D72829 Elevated white blood cell count, unspecified: Secondary | ICD-10-CM | POA: Diagnosis not present

## 2023-01-04 DIAGNOSIS — R002 Palpitations: Secondary | ICD-10-CM | POA: Diagnosis not present

## 2023-01-04 DIAGNOSIS — R000 Tachycardia, unspecified: Secondary | ICD-10-CM | POA: Diagnosis not present

## 2023-01-04 NOTE — Progress Notes (Signed)
Subjective: CC: Follow-up tachycardia  PCP: Raliegh Ip, DO UJW:JXBJ Diane Carlson is a 77 y.o. female presenting to clinic today for:  1.  Tachycardia Patient was diagnosed with URI recently.  Placed on doxycycline she notes that that seems to be getting better.  She does admit that she was on steroids for a joint issue for several days prior to being seen for sinus issue.  She was reporting palpitations and this was felt maybe be secondary to steroid use but she notes that she has been off of this for several days and has persistent palpitations.  She feels like she is hydrating without difficulty.  Denies any blood loss.  No leg swelling.  No chest pain.  No shortness of breath outside of baseline.  She has known COPD.  She does not report any fevers.  No fluid issues.  She admits that she has been having worsening nerve issues and this was related to an issue with her granddaughter being on drugs.   ROS: Per HPI  Allergies  Allergen Reactions   Pravastatin     Muscle aches   Zetia [Ezetimibe]     mylagias   Cephalexin Rash    Inside mouth   Sulfa Antibiotics Rash    Including mouth   Past Medical History:  Diagnosis Date   Allergy    Atypical pneumonia 09/25/2017   Cancer (HCC)    breast cancer; dx in 04/2018.    Cholesteatoma    Complication of anesthesia    "slow to wake up, couldn't catch my breath"- has had anesthesia since this occurence and did fine.    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    Vitamin D deficiency     Current Outpatient Medications:    albuterol (VENTOLIN HFA) 108 (90 Base) MCG/ACT inhaler, Inhale 2 puffs into the lungs every  6 (six) hours as needed for wheezing or shortness of breath., Disp: 8 g, Rfl: 2   amLODipine (NORVASC) 10 MG tablet, TAKE 1 TABLET EVERY DAY, Disp: 90 tablet, Rfl: 0   anastrozole (ARIMIDEX) 1 MG tablet, Take 1 tablet (1 mg total) by mouth daily., Disp: 90 tablet, Rfl: 3   Ascorbic Acid (VITAMIN C) 1000 MG tablet, Take 1,000 mg by mouth daily., Disp: , Rfl:    aspirin 81 MG tablet, Take 81 mg by mouth daily., Disp: , Rfl:    azelastine (ASTELIN) 0.1 % nasal spray, Place 1 spray into both nostrils 2 (two) times daily., Disp: 30 mL, Rfl: 12   benzonatate (TESSALON PERLES) 100 MG capsule, Take 1 capsule (100 mg total) by mouth 3 (three) times daily as needed., Disp: 20 capsule, Rfl: 0   busPIRone (BUSPAR) 5 MG tablet, Take 1 tablet (5 mg total) by mouth 2 (two) times daily. For anxiety, Disp: 60 tablet, Rfl: 0   cholecalciferol (VITAMIN D) 1000 UNITS tablet, Take 1,000 Units by mouth daily., Disp: , Rfl:    cyanocobalamin (VITAMIN B12) 500 MCG tablet, Take 500 mcg by mouth daily., Disp: , Rfl:    doxycycline (VIBRA-TABS) 100 MG tablet, Take 1 tablet (100 mg total) by  mouth 2 (two) times daily., Disp: 20 tablet, Rfl: 0   ELDERBERRY PO, Take by mouth., Disp: , Rfl:    empagliflozin (JARDIANCE) 25 MG TABS tablet, Take by mouth daily., Disp: , Rfl:    fluticasone (FLONASE) 50 MCG/ACT nasal spray, Place 2 sprays into both nostrils daily., Disp: 16 g, Rfl: 6   Garlic 1000 MG CAPS, Take 1,000 mg by mouth 2 (two) times daily. , Disp: , Rfl:    guaiFENesin (MUCINEX) 600 MG 12 hr tablet, Take 1 tablet (600 mg total) by mouth 2 (two) times daily., Disp: 30 tablet, Rfl: 0   rosuvastatin (CRESTOR) 10 MG tablet, TAKE 1 TABLET EVERY DAY, Disp: 90 tablet, Rfl: 0   Tiotropium Bromide-Olodaterol (STIOLTO RESPIMAT) 2.5-2.5 MCG/ACT AERS, Inhale 2 puffs into the lungs daily., Disp: 12 each, Rfl: 3   TURMERIC PO, Take by mouth., Disp: , Rfl:    Zinc Sulfate (ZINC 15 PO), Take by mouth., Disp: , Rfl:  Social History    Socioeconomic History   Marital status: Widowed    Spouse name: Not on file   Number of children: 2   Years of education: cosemtology certificate   Highest education level: Some college, no degree  Occupational History   Occupation: Retired    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[employer]  Tobacco Use   Smoking status: Former    Current packs/day: 0.00    Average packs/day: 1 pack/day for 20.0 years (20.0 ttl pk-yrs)    Types: Cigarettes    Start date: 08/25/1989    Quit date: 08/25/2009    Years since quitting: 13.3   Smokeless tobacco: Never  Vaping Use   Vaping status: Never Used  Substance and Sexual Activity   Alcohol use: No   Drug use: No   Sexual activity: Not Currently  Other Topics Concern   Not on file  Social History Narrative   Lives alone   She exercises everyday and enjoys zumba   Social Determinants of Health   Financial Resource Strain: Low Risk  (07/01/2022)   Overall Financial Resource Strain (CARDIA)    Difficulty of Paying Living Expenses: Not hard at all  Food Insecurity: No Food Insecurity (07/01/2022)   Hunger Vital Sign    Worried About Running Out of Food in the Last Year: Never true    Ran Out of Food in the Last Year: Never true  Transportation Needs: No Transportation Needs (07/01/2022)   PRAPARE - Administrator, Civil Service (Medical): No    Lack of Transportation (Non-Medical): No  Physical Activity: Sufficiently Active (07/01/2022)   Exercise Vital Sign    Days of Exercise per Week: 5 days    Minutes of Exercise per Session: 60 min  Stress: No Stress Concern Present (07/01/2022)   [employer] of Occupational Health - Occupational Stress Questionnaire    Feeling of Stress : Not at all  Social Connections: Moderately Integrated (07/01/2022)   Social Connection and Isolation Panel [NHANES]    Frequency of Communication with Friends and Family: More than three times a week    Frequency of Social Gatherings with Friends and Family:  More than three times a week    Attends Religious Services: More than 4 times per year    Active Member of [employer] or Organizations: Yes    Attends Banker Meetings: More than 4 times per year    Marital Status: Widowed  Intimate Partner Violence: Not At Risk (07/01/2022)   Humiliation, Afraid, Rape, and Kick questionnaire  Fear of Current or Ex-Partner: No    Emotionally Abused: No    Physically Abused: No    Sexually Abused: No   Family History  Problem Relation Age of Onset   Heart disease Mother    Stroke Mother 63   Heart disease Father    Heart attack Father    Hypertension Son    Heart attack Paternal Aunt    Heart disease Paternal Aunt     Objective: Office vital signs reviewed. BP 135/77   Pulse (!) 118   Temp 98.4 F (36.9 C)   Ht 5\' 3"  (1.6 m)   Wt 96 lb 12.8 oz (43.9 kg)   SpO2 95%   BMI 17.15 kg/m   Physical Examination:  General: Awake, alert, nontoxic female, No acute distress HEENT: No exophthalmos.  No goiter Cardio: tachycardic with regular rhythm, S1S2 heard, no murmurs appreciated Pulm: Globally decreased breath sounds with no wheezes, rhonchi or rales.  Normal work of breathing on room air    Assessment/ Plan: 77 y.o. female   Palpitations - Plan: T4, Free, LONG TERM MONITOR (3-14 DAYS), CANCELED: EKG 12-Lead  Tachycardia - Plan: T4, Free, Magnesium, LONG TERM MONITOR (3-14 DAYS), CANCELED: EKG 12-Lead  Leukocytosis, unspecified type - Plan: CBC with Differential  I reviewed her EKG which showed sinus tachycardia with some nonspecific ST changes.  I am going to order free T4 given suppression of TSH, magnesium and repeat CBC.  I suspect leukocytosis was secondary to corticosteroid use.  Given that she is symptomatically getting better from a sinus standpoint I doubt that this is reflective of sepsis etc.  She looks like she has been having increased heart rate for the last week or so.  This could be a subacute thyroiditis secondary  to acute illness versus new onset hyperthyroidism.  Her O2 is normal.  Her respiratory rate was normal.  Her lung exam was baseline with global decreased breath sounds but no focal findings.  If we were more suspicious of a PE would say VQ scan is what we need to order given her advanced renal disease.  ZIO patch ordered/ placed 01/05/2023   Raliegh Ip, DO [facility] [phone]

## 2023-01-05 ENCOUNTER — Ambulatory Visit: Payer: Medicare HMO

## 2023-01-05 DIAGNOSIS — R1314 Dysphagia, pharyngoesophageal phase: Secondary | ICD-10-CM | POA: Diagnosis not present

## 2023-01-05 DIAGNOSIS — R002 Palpitations: Secondary | ICD-10-CM

## 2023-01-05 DIAGNOSIS — J019 Acute sinusitis, unspecified: Secondary | ICD-10-CM | POA: Diagnosis not present

## 2023-01-05 DIAGNOSIS — R000 Tachycardia, unspecified: Secondary | ICD-10-CM

## 2023-01-05 LAB — CBC WITH DIFFERENTIAL/PLATELET
Basophils Absolute: 0 10*3/uL (ref 0.0–0.2)
Basos: 0 %
EOS (ABSOLUTE): 0.5 10*3/uL — ABNORMAL HIGH (ref 0.0–0.4)
Eos: 6 %
Hematocrit: 34.2 % (ref 34.0–46.6)
Hemoglobin: 11.2 g/dL (ref 11.1–15.9)
Immature Grans (Abs): 0.2 10*3/uL — ABNORMAL HIGH (ref 0.0–0.1)
Immature Granulocytes: 2 %
Lymphocytes Absolute: 1.2 10*3/uL (ref 0.7–3.1)
Lymphs: 13 %
MCH: 29.9 pg (ref 26.6–33.0)
MCHC: 32.7 g/dL (ref 31.5–35.7)
MCV: 91 fL (ref 79–97)
Monocytes Absolute: 0.8 10*3/uL (ref 0.1–0.9)
Monocytes: 9 %
Neutrophils Absolute: 6.7 10*3/uL (ref 1.4–7.0)
Neutrophils: 70 %
Platelets: 248 10*3/uL (ref 150–450)
RBC: 3.74 x10E6/uL — ABNORMAL LOW (ref 3.77–5.28)
RDW: 13.1 % (ref 11.7–15.4)
WBC: 9.4 10*3/uL (ref 3.4–10.8)

## 2023-01-05 LAB — MAGNESIUM: Magnesium: 2.4 mg/dL — ABNORMAL HIGH (ref 1.6–2.3)

## 2023-01-05 LAB — T4, FREE: Free T4: 1.25 ng/dL (ref 0.82–1.77)

## 2023-01-05 NOTE — Addendum Note (Signed)
Addended by: Raliegh Ip on: 01/05/2023 07:41 PM   Modules accepted: Orders

## 2023-01-06 ENCOUNTER — Other Ambulatory Visit: Payer: Medicare HMO

## 2023-01-06 ENCOUNTER — Ambulatory Visit: Payer: Medicare HMO

## 2023-01-06 DIAGNOSIS — R002 Palpitations: Secondary | ICD-10-CM

## 2023-01-06 DIAGNOSIS — R000 Tachycardia, unspecified: Secondary | ICD-10-CM | POA: Diagnosis not present

## 2023-01-06 NOTE — Progress Notes (Signed)
Zio monitor applied, started, and instruction given to patient.  Information logged into computer and placed in Zio book.

## 2023-01-11 ENCOUNTER — Other Ambulatory Visit: Payer: Medicare HMO

## 2023-01-11 DIAGNOSIS — N184 Chronic kidney disease, stage 4 (severe): Secondary | ICD-10-CM | POA: Diagnosis not present

## 2023-01-15 ENCOUNTER — Other Ambulatory Visit: Payer: Self-pay | Admitting: Otolaryngology

## 2023-01-15 DIAGNOSIS — R1314 Dysphagia, pharyngoesophageal phase: Secondary | ICD-10-CM

## 2023-01-18 ENCOUNTER — Telehealth: Payer: Self-pay | Admitting: Pulmonary Disease

## 2023-01-18 NOTE — Telephone Encounter (Signed)
Patient states that her lips and tongue are sore from Stiolto Respimat. Please advise on what she should do.

## 2023-01-19 ENCOUNTER — Telehealth: Payer: Self-pay | Admitting: Family Medicine

## 2023-01-19 ENCOUNTER — Ambulatory Visit: Payer: Medicare HMO

## 2023-01-19 DIAGNOSIS — N2581 Secondary hyperparathyroidism of renal origin: Secondary | ICD-10-CM | POA: Diagnosis not present

## 2023-01-19 DIAGNOSIS — D631 Anemia in chronic kidney disease: Secondary | ICD-10-CM | POA: Diagnosis not present

## 2023-01-19 DIAGNOSIS — R809 Proteinuria, unspecified: Secondary | ICD-10-CM | POA: Diagnosis not present

## 2023-01-19 DIAGNOSIS — N184 Chronic kidney disease, stage 4 (severe): Secondary | ICD-10-CM | POA: Diagnosis not present

## 2023-01-19 DIAGNOSIS — I129 Hypertensive chronic kidney disease with stage 1 through stage 4 chronic kidney disease, or unspecified chronic kidney disease: Secondary | ICD-10-CM | POA: Diagnosis not present

## 2023-01-19 NOTE — Telephone Encounter (Signed)
Lm x1 for patient.  

## 2023-01-19 NOTE — Telephone Encounter (Addendum)
Patient is aware/recommendations and voiced her understanding. She will stop stiolto. Med removed from medication list and added to allergy list.  We do not have samples of Anoro.    Maralyn Sago, is it okay to send in a one month supply of Anoro to pharmacy?  Dr. Vassie Loll, patient would like to see you only. No availability the remaining of the year at [facility]. Please advise.

## 2023-01-19 NOTE — Telephone Encounter (Signed)
Patient called back to return missed call.

## 2023-01-19 NOTE — Telephone Encounter (Signed)
Called and spoke to patient.  She stated that after she uses Stiolto inhaler she develops mouth swelling and lip/tongue burning. Denied sores, SOB, white patches, throat swelling or trouble swallowing.  She rinses her mouth out well after each use. She has been using 07/2022, however sx develop one month ago. Sx developed within 1hr after using inhaler.  She would like to try a different inhaler.    Diane Carlson, please advise. Thanks

## 2023-01-20 ENCOUNTER — Telehealth: Payer: Self-pay | Admitting: Pharmacist

## 2023-01-20 DIAGNOSIS — N184 Chronic kidney disease, stage 4 (severe): Secondary | ICD-10-CM

## 2023-01-20 MED ORDER — ANORO ELLIPTA 62.5-25 MCG/ACT IN AEPB: 1.0000 | INHALATION_SPRAY | Freq: Every day | RESPIRATORY_TRACT | 0 refills | Status: DC

## 2023-01-20 NOTE — Telephone Encounter (Signed)
One month supply of Anoro has been sent to preferred pharmacy.  Patient is aware and voiced her understanding.  Nothing further needed.

## 2023-01-20 NOTE — Telephone Encounter (Signed)
We need to make sure patient gets changed to jardiance 10mg  ASAP I think I scanned her PAP to you and she did not fill out  Thank you!

## 2023-01-21 ENCOUNTER — Telehealth: Payer: Self-pay | Admitting: Pulmonary Disease

## 2023-01-21 NOTE — Telephone Encounter (Signed)
See last encounter.  Patient states that Ailene Ards is not covered by her insurance we she tried to pick it up from pharmacy. She needs another substitute.Please leave a voicemail if necessary.  Pharmacy [facility]

## 2023-01-21 NOTE — Telephone Encounter (Signed)
Called and spoke to patient. She stated that Anoro is not covered. She is wanting to know alternatives.  PA team, please advise. Thanks

## 2023-01-22 ENCOUNTER — Other Ambulatory Visit (HOSPITAL_COMMUNITY): Payer: Self-pay

## 2023-01-22 DIAGNOSIS — R000 Tachycardia, unspecified: Secondary | ICD-10-CM | POA: Diagnosis not present

## 2023-01-22 DIAGNOSIS — R002 Palpitations: Secondary | ICD-10-CM | POA: Diagnosis not present

## 2023-01-22 MED ORDER — EMPAGLIFLOZIN 10 MG PO TABS
10.0000 mg | ORAL_TABLET | Freq: Every day | ORAL | 3 refills | Status: DC
Start: 2023-01-22 — End: 2023-08-30

## 2023-01-22 NOTE — Telephone Encounter (Signed)
Preferred alternative is [facility] for a co-pay of $95.00/30 days

## 2023-01-22 NOTE — Telephone Encounter (Signed)
RX changed to jardiance 10mg  daily Escribed to [employer] on behalf of [employer] PAP Thank you!

## 2023-01-25 ENCOUNTER — Other Ambulatory Visit: Payer: Self-pay | Admitting: Family Medicine

## 2023-01-25 DIAGNOSIS — I471 Supraventricular tachycardia, unspecified: Secondary | ICD-10-CM

## 2023-01-26 ENCOUNTER — Ambulatory Visit: Payer: Medicare HMO | Admitting: Family Medicine

## 2023-01-26 NOTE — Telephone Encounter (Signed)
New rx sent to [facility] cares PAP ([facility]) for jardiance 10mg  per nephro Routed message to CPhT to assist

## 2023-01-26 NOTE — Telephone Encounter (Signed)
Preferred alternative is [facility] for a co-pay of $95.00/30 days. Dr.Alva is this okay to order ?

## 2023-01-26 NOTE — Telephone Encounter (Signed)
Pt wanted Diane Carlson to know that Dr. Marisue Humble changed her jardiance from 25mg  to 10 mg.

## 2023-01-27 ENCOUNTER — Encounter: Payer: Self-pay | Admitting: Internal Medicine

## 2023-01-27 ENCOUNTER — Encounter: Payer: Self-pay | Admitting: Family Medicine

## 2023-01-27 ENCOUNTER — Ambulatory Visit: Payer: Medicare HMO | Attending: Internal Medicine | Admitting: Internal Medicine

## 2023-01-27 VITALS — BP 128/62 | HR 107 | Ht 63.0 in | Wt 95.0 lb

## 2023-01-27 DIAGNOSIS — I739 Peripheral vascular disease, unspecified: Secondary | ICD-10-CM | POA: Diagnosis not present

## 2023-01-27 DIAGNOSIS — I10 Essential (primary) hypertension: Secondary | ICD-10-CM | POA: Diagnosis not present

## 2023-01-27 DIAGNOSIS — I471 Supraventricular tachycardia, unspecified: Secondary | ICD-10-CM | POA: Diagnosis not present

## 2023-01-27 NOTE — Telephone Encounter (Signed)
Hadn't heard back on Jardiance. Can't afford $500 a month. Checking on letter dropped off last week.

## 2023-01-27 NOTE — Patient Instructions (Addendum)
Medication Instructions:  Your physician recommends that you continue on your current medications as directed. Please refer to the Current Medication list given to you today.  *If you need a refill on your cardiac medications before your next appointment, please call your pharmacy*   Testing/Procedures: Your physician has requested that you have an echocardiogram. Echocardiography is a painless test that uses sound waves to create images of your heart. It provides your doctor with information about the size and shape of your heart and how well your heart's chambers and valves are working. This procedure takes approximately one hour. There are no restrictions for this procedure. Please do NOT wear cologne, perfume, aftershave, or lotions (deodorant is allowed). Please arrive 15 minutes prior to your appointment time.    Follow-Up: At [facility], you and your health needs are our priority.  As part of our continuing mission to provide you with exceptional heart care, we have created designated Provider Care Teams.  These Care Teams include your primary Cardiologist (physician) and Advanced Practice Providers (APPs -  Physician Assistants and Nurse Practitioners) who all work together to provide you with the care you need, when you need it.  We recommend signing up for the patient portal called "MyChart".  Sign up information is provided on this After Visit Summary.  MyChart is used to connect with patients for Virtual Visits (Telemedicine).  Patients are able to view lab/test results, encounter notes, upcoming appointments, etc.  Non-urgent messages can be sent to your provider as well.   To learn more about what you can do with MyChart, go to [email or web address].    Your next appointment:   6 month(s)  Provider:   Weston Brass, MD

## 2023-01-27 NOTE — Telephone Encounter (Signed)
Spoke with patient regarding [employer] renewal and shipment.  Informed patient her updated Jardiance dose was sent to PAP pharmacy. Provided company phone number to follow up on shipment. Pt still has 25mg  tablets left in the meantime that she's taking.   Patient also authorized me to go ahead and complete renewal application she dropped off. She was unaware she was supposed to complete anything on it. I will fax over provider portion for completion as soon as possible.

## 2023-01-27 NOTE — Progress Notes (Unsigned)
Cardiology Office Note:  .   Date:  01/27/2023  ID:  Diane Carlson, DOB 01/15/1946, MRN [id number] PCP: Raliegh Ip, DO  [facility] Providers Cardiologist:  None PV Cardiologist:  Lorine Bears, MD    History of Present Illness: Diane Carlson Kitchen   Diane Carlson is a 77 y.o. female.  Discussed the use of AI scribe software for clinical note transcription with the patient, who gave verbal consent to proceed.  History of Present Illness   The patient, with a history of breast cancer, essential hypertension, gout, hyperlipidemia, chronic kidney disease, previous tobacco use, COPD, prior stroke, and carotid endarterectomy, presents with palpitations. She reports feeling weak and has been losing weight. She has been monitored for SVT. The patient also reports issues with food sticking in the esophagus and is scheduled for a swallow study. She has a history of leg pain due to a blockage in the left leg, but this has improved with a walking program. The patient is on rosuvastatin for hyperlipidemia and Jardiance for kidney disease. She also has a history of lung disease and is prescribed Anoro.        ROS: negative except per HPI above.  Studies Reviewed: Diane Carlson Kitchen   EKG Interpretation Date/Time:  Wednesday January 27 2023 08:24:19 EDT Ventricular Rate:  107 PR Interval:  144 QRS Duration:  68 QT Interval:  348 QTC Calculation: 464 R Axis:   44  Text Interpretation: Sinus tachycardia Confirmed by Weston Brass ([id number]) on 01/27/2023 8:30:40 AM    Results   LABS TSH: low T4: normal  RADIOLOGY CT scan of chest: severe 3 vessel coronary artery calcification, aortic valve calcification  DIAGNOSTIC EKG: sinus tachycardia (01/26/2023)     Risk Assessment/Calculations:             Physical Exam:   VS:  BP 128/62 (BP Location: Left Arm, Patient Position: Sitting, Cuff Size: Normal)   Pulse (!) 107   Ht 5\' 3"  (1.6 m)   Wt 95 lb (43.1 kg)   BMI 16.83 kg/m    Wt Readings  from Last 3 Encounters:  01/27/23 95 lb (43.1 kg)  01/04/23 96 lb 12.8 oz (43.9 kg)  12/31/22 97 lb 9.6 oz (44.3 kg)     Physical Exam   CARDIOVASCULAR: Sinus tachycardia noted on EKG. No murmurs, rubs, or gallops auscultated.  EXTREMITIES: No edema.     GEN: Well nourished, well developed in no acute distress NECK: No JVD; No carotid bruits CARDIAC: RRR, no murmurs, rubs, gallops RESPIRATORY:  Clear to auscultation without rales, wheezing or rhonchi  ABDOMEN: Soft, non-tender, non-distended EXTREMITIES:  No edema; No deformity   ASSESSMENT AND PLAN: .    1. Essential hypertension, benign   2. PAD (peripheral artery disease) (HCC)   3. SVT (supraventricular tachycardia) (HCC)     Assessment and Plan    Supraventricular Tachycardia (SVT) Short episodes of SVT noted on monitor, likely related to underlying COPD. No associated symptoms of dizziness or fainting. -Order echocardiogram to assess for atrial enlargement or phtn secondary to lung disease. -Continue monitoring symptoms and correlate with SVT episodes.  Chronic Obstructive Pulmonary Disease (COPD) History of annual exacerbations requiring antibiotics and steroids. Currently on Anoro inhaler. -Encourage consistent use of prescribed inhaler to manage lung disease and potentially reduce cardiac symptoms.  Peripheral Artery Disease (PAD) History of left calf claudication, improved with walking program. Previously prescribed cilostazol, but discontinued due to palpitations. -Continue walking program as tolerated.  Nutrition and Weight Loss Noted  to have decreased appetite and weight loss. No current dietary restrictions due to CKD. -Encourage intake of calorie-dense and protein-rich foods to maintain muscle mass and strength. -Consider shakes or nutritional supplements like Boost or Ensure. -Discuss protein limits with nephrologist at next visit in 6 months.  Dysphagia Reports food sticking in throat, scheduled for  swallow study on 02/11/2023. -Continue with planned swallow study.  Coronary Artery Disease (CAD) Noted to have significant plaque and calcium in coronary arteries on CT scan. No current symptoms of chest pain or pressure. -Continue current cholesterol medication. -Monitor for any new or changing symptoms of chest pain or shortness of breath.  Follow-up in 6 months.                Total time of encounter: 45 minutes total time of encounter, including 30 minutes spent in face-to-face patient care on the date of this encounter. This time includes coordination of care and counseling regarding above mentioned problem list. Remainder of non-face-to-face time involved reviewing chart documents/testing relevant to the patient encounter and documentation in the medical record. I have independently reviewed documentation from referring provider.   Weston Brass, MD, [facility] [place]  [facility]

## 2023-01-29 NOTE — Telephone Encounter (Signed)
Faxed [employer] provider page to pcp for signature.

## 2023-01-29 NOTE — Telephone Encounter (Signed)
Information communicated to CPhT re: PAP changes New RX for Jardiance 10mg  sent to PAP pharmacy

## 2023-02-01 ENCOUNTER — Ambulatory Visit (INDEPENDENT_AMBULATORY_CARE_PROVIDER_SITE_OTHER): Payer: Medicare HMO

## 2023-02-01 DIAGNOSIS — Z23 Encounter for immunization: Secondary | ICD-10-CM

## 2023-02-05 NOTE — Telephone Encounter (Signed)
Submitted RE-ENROLLMENT application for JARDIANCE to [employer]) for patient assistance.   Phone: [phone]

## 2023-02-10 ENCOUNTER — Ambulatory Visit: Payer: Medicare HMO | Attending: Internal Medicine

## 2023-02-10 DIAGNOSIS — I471 Supraventricular tachycardia, unspecified: Secondary | ICD-10-CM

## 2023-02-10 DIAGNOSIS — I10 Essential (primary) hypertension: Secondary | ICD-10-CM | POA: Diagnosis not present

## 2023-02-11 ENCOUNTER — Ambulatory Visit
Admission: RE | Admit: 2023-02-11 | Discharge: 2023-02-11 | Disposition: A | Discharge status: Home / Self Care | Payer: Medicare HMO | Source: Ambulatory Visit | Attending: Otolaryngology | Admitting: Otolaryngology

## 2023-02-11 DIAGNOSIS — K2289 Other specified disease of esophagus: Secondary | ICD-10-CM | POA: Diagnosis not present

## 2023-02-11 DIAGNOSIS — R1314 Dysphagia, pharyngoesophageal phase: Secondary | ICD-10-CM

## 2023-02-11 DIAGNOSIS — R1310 Dysphagia, unspecified: Secondary | ICD-10-CM | POA: Diagnosis not present

## 2023-02-11 DIAGNOSIS — K219 Gastro-esophageal reflux disease without esophagitis: Secondary | ICD-10-CM | POA: Diagnosis not present

## 2023-02-12 LAB — ECHOCARDIOGRAM COMPLETE
AR max vel: 2.37 cm2
AV Area VTI: 2.5 cm2
AV Area mean vel: 2.05 cm2
AV Mean grad: 7 mm[Hg]
AV Peak grad: 12.7 mm[Hg]
Ao pk vel: 1.78 m/s
Area-P 1/2: 4.31 cm2
Calc EF: 55 %
MV VTI: 2.71 cm2
S' Lateral: 2.4 cm
Single Plane A2C EF: 58.3 %
Single Plane A4C EF: 50.1 %

## 2023-02-16 ENCOUNTER — Telehealth: Payer: Self-pay

## 2023-02-16 ENCOUNTER — Ambulatory Visit: Payer: Medicare HMO | Admitting: Nurse Practitioner

## 2023-02-16 NOTE — Telephone Encounter (Signed)
Attempted to call pt to figure out what medication she is referring to for her appt today

## 2023-03-08 NOTE — Telephone Encounter (Signed)
Per Durward Mallard, pt only has assistance for jardiance on her end no inhaler. Does Raynelle Fanning or pcp know about inhaler? Please call back  Copied from CRM [id number]. Topic: Clinical - Medication Question >> Mar 08, 2023  2:20 PM Alphonzo Lemmings O wrote: Reason for CRM: patient is calling about a inhaler that Ms. Raynelle Fanning was going to help her to get her medication and humana called to see was the application sent in yet  and patient is needing to know what's going on with this . Please give patient a call back. Call back number [phone] Ms Domonic patient number

## 2023-03-11 ENCOUNTER — Other Ambulatory Visit: Payer: Self-pay | Admitting: Family Medicine

## 2023-03-11 ENCOUNTER — Telehealth: Payer: Self-pay | Admitting: Family Medicine

## 2023-03-11 ENCOUNTER — Other Ambulatory Visit: Payer: Self-pay | Admitting: Hematology and Oncology

## 2023-03-11 DIAGNOSIS — E782 Mixed hyperlipidemia: Secondary | ICD-10-CM

## 2023-03-11 DIAGNOSIS — E1142 Type 2 diabetes mellitus with diabetic polyneuropathy: Secondary | ICD-10-CM | POA: Diagnosis not present

## 2023-03-11 DIAGNOSIS — L84 Corns and callosities: Secondary | ICD-10-CM | POA: Diagnosis not present

## 2023-03-11 DIAGNOSIS — B351 Tinea unguium: Secondary | ICD-10-CM | POA: Diagnosis not present

## 2023-03-11 DIAGNOSIS — I10 Essential (primary) hypertension: Secondary | ICD-10-CM

## 2023-03-11 DIAGNOSIS — M79676 Pain in unspecified toe(s): Secondary | ICD-10-CM | POA: Diagnosis not present

## 2023-03-11 NOTE — Telephone Encounter (Signed)
Copied from CRM [id number]. Topic: Clinical - Medication Question >> Mar 11, 2023 11:41 AM Dimitri Ped wrote: Reason for CRM: patient is calling concerning a application that julie is suppose to turn in for medication.patient is needing a call back please

## 2023-03-15 ENCOUNTER — Telehealth: Payer: Self-pay | Admitting: Family Medicine

## 2023-03-15 NOTE — Telephone Encounter (Signed)
 Copied from CRM [id number]. Topic: Clinical - Prescription Issue >> Mar 15, 2023 12:46 PM Dennison Nancy wrote: Reason for CRM: Patient calling to speak with Raynelle Fanning regarding an application that for a inhaler , message was sent on 12/12 .reach out to [employer] and was told that Camile is working on the application for the inhaler and give it some time if any question Camile will reachout to the patient if have any questio

## 2023-03-19 NOTE — Telephone Encounter (Signed)
Application still pending.   Will add stiolto once approved.   Updated patient pages saved with Lexani Corona.

## 2023-04-05 ENCOUNTER — Other Ambulatory Visit: Payer: Self-pay | Admitting: Family Medicine

## 2023-04-05 DIAGNOSIS — Z0000 Encounter for general adult medical examination without abnormal findings: Secondary | ICD-10-CM

## 2023-04-07 ENCOUNTER — Encounter (HOSPITAL_BASED_OUTPATIENT_CLINIC_OR_DEPARTMENT_OTHER): Payer: Self-pay | Admitting: Pulmonary Disease

## 2023-04-07 ENCOUNTER — Ambulatory Visit (HOSPITAL_BASED_OUTPATIENT_CLINIC_OR_DEPARTMENT_OTHER): Payer: Medicare HMO | Admitting: Pulmonary Disease

## 2023-04-07 ENCOUNTER — Telehealth: Payer: Self-pay

## 2023-04-07 VITALS — BP 118/58 | HR 105 | Resp 14 | Ht 63.0 in | Wt 97.6 lb

## 2023-04-07 DIAGNOSIS — J432 Centrilobular emphysema: Secondary | ICD-10-CM | POA: Diagnosis not present

## 2023-04-07 MED ORDER — BREZTRI AEROSPHERE 160-9-4.8 MCG/ACT IN AERO: 2.0000 | INHALATION_SPRAY | Freq: Two times a day (BID) | RESPIRATORY_TRACT | Status: DC

## 2023-04-07 MED ORDER — BREZTRI AEROSPHERE 160-9-4.8 MCG/ACT IN AERO: 2.0000 | INHALATION_SPRAY | Freq: Two times a day (BID) | RESPIRATORY_TRACT | 5 refills | Status: DC

## 2023-04-07 NOTE — Patient Instructions (Signed)
 Sample of breztri - 2 puffs twice daily , rinse mouth after use

## 2023-04-07 NOTE — Addendum Note (Signed)
 Addended by: Jama Flavors on: 04/07/2023 10:41 AM   Modules accepted: Orders

## 2023-04-07 NOTE — Progress Notes (Signed)
   Subjective:    Patient ID: Diane Carlson, female    DOB: 1945/06/23, 78 y.o.   MRN: [id number]  HPI  78 year old ex-smoker for follow-up of COPD and pneumonia  PMH-breast cancer status post left lumpectomy, ER positive CKD Hypertension Left CEA She smoked about a pack per day, 40 pack years before she quit in 2011  Initial OV 07/2022 presented with right upper lobe reticulonodular infiltrate Switched her from Trelegy to [facility]  12/2022 phone call >> mild swelling and lip/tongue burning after using Stiolto, switch to Anoro >> she never filled the prescription     Discussed the use of AI scribe software for clinical note transcription with the patient, who gave verbal consent to proceed.  History of Present Illness   The patient, with a history of severe emphysema, presents with a complaint of sticky lips. She suspects this may be a side effect of her inhaler, Anoro, which she has been using daily. She previously tried Artist, but experienced similar symptoms. Despite her emphysema, the patient reports no significant breathing problems and maintains an active lifestyle, walking a mile every morning and going to the gym. She has not experienced any respiratory problems or hiccups since her pneumonia last year. The patient also has a history of breast cancer on the left side, for which she did not receive radiation treatment.      Significant tests/ events reviewed  09/08/2022 CT chest wo contrast: trace pericardial effusion. Severe atherosclerosis and CAD. Small hia Severe upper lobe predominant centrilobular emphysema. Foal area of interstitial thickening and ground-glass attenuation of RUL   CT chest without contrast 02/2018 BL apical scarring, severe emphysema, subpleural opacity left upper lobe   10/2017 PFTs severe airway obstruction, ratio 47, FEV1 40%, FVC 65%, TLC 169% showing hyperinflation, DLCO 40%  Review of Systems neg for any significant sore throat,  dysphagia, itching, sneezing, nasal congestion or excess/ purulent secretions, fever, chills, sweats, unintended wt loss, pleuritic or exertional cp, hempoptysis, orthopnea pnd or change in chronic leg swelling. Also denies presyncope, palpitations, heartburn, abdominal pain, nausea, vomiting, diarrhea or change in bowel or urinary habits, dysuria,hematuria, rash, arthralgias, visual complaints, headache, numbness weakness or ataxia.     Objective:   Physical Exam  Gen. Pleasant, well-nourished, in no distress ENT - no thrush, no pallor/icterus,no post nasal drip Neck: No JVD, no thyromegaly, no carotid bruits Lungs: no use of accessory muscles, no dullness to percussion, decreased BS BL without rales or rhonchi  Cardiovascular: Rhythm regular, heart sounds  normal, no murmurs or gallops, no peripheral edema Musculoskeletal: No deformities, no cyanosis or clubbing        Assessment & Plan:    Assessment and Plan    Severe Emphysema Severe emphysema, well-managed since last scan in October 2024. No significant respiratory issues or exacerbations since last pneumonia episode last year. Current medication (Stiolto) causing lip dryness and stickiness. Discussed switching to a twice-daily spray inhaler to mitigate side effects. Patient agreed to the change. - Discontinue Stiolto - Prescribe new inhaler Breztri  - sample provided (twice daily spray) - Send prescription to pharmacy and check insurance coverage - Follow up in six months  Breast Cancer Left-sided breast cancer, no radiation therapy received. - Monitor for recurrence or new symptoms  General Health Maintenance Received flu and RSV vaccinations. Declined latest COVID-19 booster. - Encourage consideration of COVID-19 booster.

## 2023-04-07 NOTE — Telephone Encounter (Signed)
 Copied from CRM [id number]. Topic: Clinical - Medication Question >> Apr 07, 2023 12:03 PM Maxwell Marion wrote: Reason for CRM: Patient would like a call back from Raynelle Fanning, this is regarding the inhaler

## 2023-04-08 ENCOUNTER — Telehealth: Payer: Self-pay | Admitting: Family Medicine

## 2023-04-08 NOTE — Telephone Encounter (Signed)
Copied from CRM [id number]. Topic: Clinical - Prescription Issue >> Apr 08, 2023  4:34 PM Diane Carlson wrote: Reason for CRM: Patient wants to know if Raynelle Fanning was able to take care of her medication change, please call [phone]

## 2023-04-08 NOTE — Telephone Encounter (Signed)
 Copied from CRM [id number]. Topic: Clinical - Prescription Issue >> Apr 08, 2023  4:34 PM Shelah Lewandowsky wrote: Reason for CRM: Patient wants to know if Raynelle Fanning was able to take care of her medication change, please call [phone]

## 2023-04-09 ENCOUNTER — Telehealth: Payer: Self-pay | Admitting: Family Medicine

## 2023-04-09 ENCOUNTER — Telehealth: Payer: Self-pay | Admitting: Pulmonary Disease

## 2023-04-09 MED ORDER — BREZTRI AEROSPHERE 160-9-4.8 MCG/ACT IN AERO: 2.0000 | INHALATION_SPRAY | Freq: Two times a day (BID) | RESPIRATORY_TRACT | 5 refills | Status: AC

## 2023-04-09 NOTE — Telephone Encounter (Signed)
 Pt will like to go back to Stiolto the [place] prescribed cost her nearly $400

## 2023-04-09 NOTE — Telephone Encounter (Signed)
 Patient has been switched from Stiolto to [place] Please send me AZ&me PAP for breztri escribed breztri to [facility]

## 2023-04-09 NOTE — Telephone Encounter (Signed)
 Copied from CRM [id number]. Topic: General - Other >> Apr 09, 2023  8:06 AM Diane Carlson wrote: Reason for CRM: patient is wanting to speak with julie . Concerning medication . Came over yesterday and told the girl in front the name of the wrong medication.  Stiolto is the right name of the medication. He was gone change it but its was expensive like 400 dollars . Going back to same medication. Patient is requesting to speak with julie . Call back number [phone] Dont want to change want to leave like it is dealing with patients medication

## 2023-04-09 NOTE — Addendum Note (Signed)
 Addended by: Vanice Sarah D on: 04/09/2023 09:39 AM   Modules accepted: Orders

## 2023-04-10 NOTE — Telephone Encounter (Signed)
 Routing to Dr. Vassie Loll for him to be able to view.

## 2023-04-12 NOTE — Telephone Encounter (Signed)
 Emailed you patient page :)   Will submit ASAP due to patient concern and multiple calls.

## 2023-04-12 NOTE — Telephone Encounter (Signed)
 Per Raynelle Fanning 04/09/23, will do Breztri instead of Stiolto.

## 2023-04-12 NOTE — Telephone Encounter (Signed)
 We are currently working on getting her patient assistance with AZ&ME for the Breztri  medication. Was going to attempt assistance with [employer] for Stiolto, being that her renewal for Jardiance  is pending with this company, but her renewal has been pending about a month now due to the high volume season.

## 2023-04-13 NOTE — Progress Notes (Signed)
 Patient's chart reviewed with Dr Sampson Goon, patient has Hx severe Emphysema and poor PFT's. She will need to come in for at rest pulse ox and walking pulse ox.

## 2023-04-14 ENCOUNTER — Other Ambulatory Visit: Payer: Self-pay

## 2023-04-14 ENCOUNTER — Other Ambulatory Visit: Payer: Self-pay | Admitting: Otolaryngology

## 2023-04-14 ENCOUNTER — Encounter (HOSPITAL_BASED_OUTPATIENT_CLINIC_OR_DEPARTMENT_OTHER): Payer: Self-pay | Admitting: Otolaryngology

## 2023-04-14 NOTE — Telephone Encounter (Signed)
 Breztri  app in process

## 2023-04-14 NOTE — Telephone Encounter (Signed)
 Hey! You may have already reached out---can you let patient know we are processing the breztri  inhaler application?  It looks like they tried to switch her back to stiolto due to cost.  I would rather proceed with breztri .  Please let her know if pulm keeps switching her then they can take over paperwork.  We just need this one to be done. Thanks!!

## 2023-04-15 ENCOUNTER — Encounter (HOSPITAL_BASED_OUTPATIENT_CLINIC_OR_DEPARTMENT_OTHER): Payer: Self-pay

## 2023-04-15 ENCOUNTER — Telehealth: Payer: Self-pay

## 2023-04-15 NOTE — Telephone Encounter (Signed)
Pt informed of pending assistance for Breztri inhaler. She will continue to use Stiolto until she receives a decision from AZ&ME.

## 2023-04-15 NOTE — Progress Notes (Signed)
Pharmacy Medication Assistance Program Note    04/15/2023  Patient ID: Diane Carlson, female   DOB: 1946/01/17, 78 y.o.   MRN: [id number]     01/29/2023 02/05/2023  Outreach Medication One  Manufacturer Medication One [employer]  [employer] Drugs Jardiance Jardiance  Dose of Jardiance  10MG   Type of Forensic scientist Assistance  Date Application Sent to Prescriber 01/29/2023   Name of Prescriber  ASHLY GOTTSCHALK  Application Items Received From Patient  Application  Date Application Submitted to Manufacturer  02/05/2023  Method Application Sent to Manufacturer  Fax  Patient Assistance Determination  Approved  Approval Start Date  02/08/2023  Approval End Date  03/29/2024

## 2023-04-15 NOTE — Addendum Note (Signed)
Addended by: Vanice Sarah D on: 04/15/2023 01:41 PM   Modules accepted: Orders

## 2023-04-15 NOTE — Progress Notes (Signed)
Patient evaluated, pulse ox 97% on room air, sitting in chair. After walking pulse ox-99%.  No Sob noted. Patient states "I do not want Botox injection". Notified Carla at Dr. Jenne Pane office.

## 2023-04-15 NOTE — Progress Notes (Signed)
 Pharmacy Medication Assistance Program Note    05/24/2023  Patient ID: Diane Carlson, female  DOB: 02-Sep-1945, 78 y.o.  MRN:  [id number]     04/15/2023  Outreach Medication Two  Manufacturer Medication Two [employer] Bretztri  Type of Journalist, newspaper to [employer] Fax  Date Application Submitted to Manufacturer 04/15/2023  Patient Assistance Determination Approved  Approval Start Date 04/16/2023

## 2023-04-20 ENCOUNTER — Other Ambulatory Visit: Payer: Self-pay | Admitting: Otolaryngology

## 2023-04-21 ENCOUNTER — Ambulatory Visit (HOSPITAL_BASED_OUTPATIENT_CLINIC_OR_DEPARTMENT_OTHER): Payer: Medicare HMO | Admitting: Anesthesiology

## 2023-04-21 ENCOUNTER — Ambulatory Visit (HOSPITAL_BASED_OUTPATIENT_CLINIC_OR_DEPARTMENT_OTHER)
Admission: RE | Admit: 2023-04-21 | Discharge: 2023-04-21 | Disposition: A | Discharge status: Home / Self Care | Payer: Medicare HMO | Attending: Otolaryngology | Admitting: Otolaryngology

## 2023-04-21 ENCOUNTER — Encounter (HOSPITAL_BASED_OUTPATIENT_CLINIC_OR_DEPARTMENT_OTHER)
Admission: RE | Disposition: A | Discharge status: Home / Self Care | Payer: Self-pay | Source: Home / Self Care | Attending: Otolaryngology

## 2023-04-21 ENCOUNTER — Other Ambulatory Visit: Payer: Self-pay

## 2023-04-21 ENCOUNTER — Encounter (HOSPITAL_BASED_OUTPATIENT_CLINIC_OR_DEPARTMENT_OTHER): Payer: Self-pay | Admitting: Otolaryngology

## 2023-04-21 DIAGNOSIS — I129 Hypertensive chronic kidney disease with stage 1 through stage 4 chronic kidney disease, or unspecified chronic kidney disease: Secondary | ICD-10-CM

## 2023-04-21 DIAGNOSIS — Z79811 Long term (current) use of aromatase inhibitors: Secondary | ICD-10-CM | POA: Diagnosis not present

## 2023-04-21 DIAGNOSIS — I358 Other nonrheumatic aortic valve disorders: Secondary | ICD-10-CM | POA: Diagnosis not present

## 2023-04-21 DIAGNOSIS — F419 Anxiety disorder, unspecified: Secondary | ICD-10-CM | POA: Diagnosis not present

## 2023-04-21 DIAGNOSIS — Z7984 Long term (current) use of oral hypoglycemic drugs: Secondary | ICD-10-CM | POA: Diagnosis not present

## 2023-04-21 DIAGNOSIS — Z7982 Long term (current) use of aspirin: Secondary | ICD-10-CM | POA: Diagnosis not present

## 2023-04-21 DIAGNOSIS — I10 Essential (primary) hypertension: Secondary | ICD-10-CM | POA: Diagnosis not present

## 2023-04-21 DIAGNOSIS — Z87891 Personal history of nicotine dependence: Secondary | ICD-10-CM | POA: Insufficient documentation

## 2023-04-21 DIAGNOSIS — N184 Chronic kidney disease, stage 4 (severe): Secondary | ICD-10-CM

## 2023-04-21 DIAGNOSIS — K222 Esophageal obstruction: Secondary | ICD-10-CM | POA: Diagnosis not present

## 2023-04-21 DIAGNOSIS — J449 Chronic obstructive pulmonary disease, unspecified: Secondary | ICD-10-CM | POA: Diagnosis not present

## 2023-04-21 DIAGNOSIS — Z01818 Encounter for other preprocedural examination: Secondary | ICD-10-CM

## 2023-04-21 DIAGNOSIS — Z79899 Other long term (current) drug therapy: Secondary | ICD-10-CM | POA: Insufficient documentation

## 2023-04-21 DIAGNOSIS — R1314 Dysphagia, pharyngoesophageal phase: Secondary | ICD-10-CM | POA: Insufficient documentation

## 2023-04-21 DIAGNOSIS — R1310 Dysphagia, unspecified: Secondary | ICD-10-CM | POA: Diagnosis present

## 2023-04-21 HISTORY — DX: Chronic obstructive pulmonary disease, unspecified: J44.9

## 2023-04-21 HISTORY — DX: Other specified postprocedural states: Z98.890

## 2023-04-21 HISTORY — DX: Anxiety disorder, unspecified: F41.9

## 2023-04-21 HISTORY — PX: ESOPHAGOSCOPY WITH DILITATION: SHX5618

## 2023-04-21 SURGERY — ESOPHAGOSCOPY, WITH DILATION
Anesthesia: General | Site: Throat

## 2023-04-21 MED ORDER — OXYCODONE HCL 5 MG/5ML PO SOLN: 5.0000 mg | Freq: Once | ORAL | Status: DC | PRN

## 2023-04-21 MED ORDER — DROPERIDOL 2.5 MG/ML IJ SOLN
0.6250 mg | Freq: Once | INTRAMUSCULAR | Status: AC | PRN
  Administered 2023-04-21: 0.625 mg via INTRAVENOUS

## 2023-04-21 MED ORDER — LIDOCAINE 2% (20 MG/ML) 5 ML SYRINGE
INTRAMUSCULAR | Status: DC | PRN
  Administered 2023-04-21: 40 mg via INTRAVENOUS

## 2023-04-21 MED ORDER — DEXAMETHASONE SODIUM PHOSPHATE 10 MG/ML IJ SOLN
INTRAMUSCULAR | Status: DC | PRN
  Administered 2023-04-21: 10 mg via INTRAVENOUS

## 2023-04-21 MED ORDER — ONDANSETRON HCL 4 MG/2ML IJ SOLN
INTRAMUSCULAR | Status: AC
  Filled 2023-04-21: qty 2

## 2023-04-21 MED ORDER — LIDOCAINE 2% (20 MG/ML) 5 ML SYRINGE
INTRAMUSCULAR | Status: AC
  Filled 2023-04-21: qty 5

## 2023-04-21 MED ORDER — FENTANYL CITRATE (PF) 100 MCG/2ML IJ SOLN: 25.0000 ug | INTRAMUSCULAR | Status: DC | PRN

## 2023-04-21 MED ORDER — EPINEPHRINE PF 1 MG/ML IJ SOLN
INTRAMUSCULAR | Status: AC
  Filled 2023-04-21: qty 2

## 2023-04-21 MED ORDER — FENTANYL CITRATE (PF) 100 MCG/2ML IJ SOLN
INTRAMUSCULAR | Status: DC | PRN
  Administered 2023-04-21 (×2): 50 ug via INTRAVENOUS

## 2023-04-21 MED ORDER — DEXAMETHASONE SODIUM PHOSPHATE 10 MG/ML IJ SOLN
INTRAMUSCULAR | Status: AC
Start: 2023-04-21 — End: ?
  Filled 2023-04-21: qty 1

## 2023-04-21 MED ORDER — DROPERIDOL 2.5 MG/ML IJ SOLN
INTRAMUSCULAR | Status: AC
  Filled 2023-04-21: qty 2

## 2023-04-21 MED ORDER — LIDOCAINE-EPINEPHRINE 1 %-1:100000 IJ SOLN
INTRAMUSCULAR | Status: AC
  Filled 2023-04-21: qty 1

## 2023-04-21 MED ORDER — PROPOFOL 10 MG/ML IV BOLUS
INTRAVENOUS | Status: DC | PRN
  Administered 2023-04-21: 80 ug via INTRAVENOUS

## 2023-04-21 MED ORDER — SUCCINYLCHOLINE CHLORIDE 200 MG/10ML IV SOSY
PREFILLED_SYRINGE | INTRAVENOUS | Status: AC
  Filled 2023-04-21: qty 10

## 2023-04-21 MED ORDER — SODIUM CHLORIDE 0.9 % IV SOLN: INTRAVENOUS | Status: DC | PRN

## 2023-04-21 MED ORDER — ONDANSETRON HCL 4 MG/2ML IJ SOLN
INTRAMUSCULAR | Status: DC | PRN
  Administered 2023-04-21: 4 mg via INTRAVENOUS

## 2023-04-21 MED ORDER — ACETAMINOPHEN 500 MG PO TABS: 500.0000 mg | ORAL_TABLET | Freq: Four times a day (QID) | ORAL | Status: DC | PRN

## 2023-04-21 MED ORDER — LACTATED RINGERS IV SOLN: INTRAVENOUS | Status: DC

## 2023-04-21 MED ORDER — OXYCODONE HCL 5 MG PO TABS: 5.0000 mg | ORAL_TABLET | Freq: Once | ORAL | Status: DC | PRN

## 2023-04-21 MED ORDER — SUCCINYLCHOLINE CHLORIDE 200 MG/10ML IV SOSY
PREFILLED_SYRINGE | INTRAVENOUS | Status: DC | PRN
  Administered 2023-04-21: 100 mg via INTRAVENOUS
  Administered 2023-04-21: 20 mg via INTRAVENOUS

## 2023-04-21 SURGICAL SUPPLY — 26 items
ANTIFOG SOL W/FOAM PAD STRL (MISCELLANEOUS) IMPLANT
CANISTER SUCT 1200ML W/VALVE (MISCELLANEOUS) ×2 IMPLANT
CNTNR URN SCR LID CUP LEK RST (MISCELLANEOUS) IMPLANT
GAUZE SPONGE 4X4 12PLY STRL LF (GAUZE/BANDAGES/DRESSINGS) ×4 IMPLANT
GLOVE BIO SURGEON STRL SZ7.5 (GLOVE) ×2 IMPLANT
GOWN STRL REUS W/ TWL LRG LVL3 (GOWN DISPOSABLE) IMPLANT
GOWN STRL REUS W/ TWL XL LVL3 (GOWN DISPOSABLE) IMPLANT
GUARD TEETH (MISCELLANEOUS) IMPLANT
IV SET EXT 30 76VOL 4 MALE LL (IV SETS) ×2 IMPLANT
NDL HYPO 18GX1.5 BLUNT FILL (NEEDLE) ×1 IMPLANT
NDL SPNL 22GX7 QUINCKE BK (NEEDLE) IMPLANT
NEEDLE HYPO 18GX1.5 BLUNT FILL (NEEDLE) ×2 IMPLANT
NEEDLE SPNL 22GX7 QUINCKE BK (NEEDLE) IMPLANT
NS IRRIG 1000ML POUR BTL (IV SOLUTION) ×2 IMPLANT
PACK BASIN DAY SURGERY FS (CUSTOM PROCEDURE TRAY) ×2 IMPLANT
PATTIES SURGICAL .5 X3 (DISPOSABLE) ×2 IMPLANT
SHEET MEDIUM DRAPE 40X70 STRL (DRAPES) ×2 IMPLANT
SLEEVE SCD COMPRESS KNEE MED (STOCKING) ×1 IMPLANT
SOLUTION ANTFG W/FOAM PAD STRL (MISCELLANEOUS) IMPLANT
SURGILUBE 2OZ TUBE FLIPTOP (MISCELLANEOUS) ×4 IMPLANT
SYR 3ML 23GX1 SAFETY (SYRINGE) IMPLANT
SYR 5ML LL (SYRINGE) ×2 IMPLANT
SYR CONTROL 10ML LL (SYRINGE) IMPLANT
SYR TB 1ML LL NO SAFETY (SYRINGE) IMPLANT
TOWEL GREEN STERILE FF (TOWEL DISPOSABLE) ×2 IMPLANT
TUBE CONNECTING 20X1/4 (TUBING) ×2 IMPLANT

## 2023-04-21 NOTE — Transfer of Care (Signed)
Immediate Anesthesia Transfer of Care Note  Patient: Diane Carlson  Procedure(s) Performed: RIGID ESOPHAGOSCOPY WITH DILATION (Throat)  Patient Location: PACU  Anesthesia Type:General  Level of Consciousness: awake and drowsy  Airway & Oxygen Therapy: Patient Spontanous Breathing and Patient connected to face mask oxygen  Post-op Assessment: Report given to RN, Post -op Vital signs reviewed and stable, and Patient moving all extremities  Post vital signs: Reviewed and stable  Last Vitals:  Vitals Value Taken Time  BP 130/66 04/21/23 1315  Temp 36.3 C 04/21/23 1315  Pulse 94 04/21/23 1321  Resp 28 04/21/23 1321  SpO2 100 % 04/21/23 1321  Vitals shown include unfiled device data.  Last Pain:  Vitals:   04/21/23 1315  TempSrc:   PainSc: 0-No pain         Complications: No notable events documented.

## 2023-04-21 NOTE — Op Note (Signed)
Preop diagnosis: Dysphagia, upper esophageal narrowing Postop diagnosis: same Procedure: Rigid esophagoscopy with dilation Surgeon: Jenne Pane Anesth: General Compl: None Findings: Narrowing of esophagus 14 cm from lip at level of upper esophageal sphincter.  Unable to pass the cervical esophagoscope at first but able to pass the narrower, longer esophagoscope.  Area dilated from 40 Fr to 48 Fr.  Only mucosal tear seen afterwards. Description:  After discussing risks, benefits, and alternatives, the patient was brought to the operating room and placed on the operative table in the supine position.  Anesthesia was induced and the patient was intubated by the anesthesia team without difficulty.  The eyes were taped closed and the bed was turned 90 degrees from anesthesia.  A damp gauze was placed over the upper gum and the cervical esophagoscope was inserted and passed into the esophageal lumen.  It was unable to be passed beyond the narrow area found 14 cm from the lip.  The narrower, longer esophagoscope was instead used and was able to be passed beyond the narrow area.  The remainder of the esophagus was normal.  The esophagoscope was removed and Maloney dilators were then passed serially starting with a 40 Fr and ending with a 48 Fr dilator.  The cervical esophagoscope was reinserted and used to evaluate the site.  Findings are above.  The cervical esophagoscope was able to be passed beyond the narrow area without difficulty.  After removing the esophagoscope, the patient was turned back to anesthesia for wake up and was extubated and moved to the recovery room in stable condition.

## 2023-04-21 NOTE — Anesthesia Preprocedure Evaluation (Addendum)
Anesthesia Evaluation  Patient identified by MRN, date of birth, ID band Patient awake    Reviewed: Allergy & Precautions, H&P , NPO status , Patient's Chart, lab work & pertinent test results  History of Anesthesia Complications (+) PONV and history of anesthetic complications  Airway Mallampati: II  TM Distance: >3 FB Neck ROM: Full    Dental no notable dental hx. (+) Upper Dentures, Lower Dentures   Pulmonary pneumonia, COPD, former smoker   Pulmonary exam normal breath sounds clear to auscultation       Cardiovascular hypertension, Normal cardiovascular exam Rhythm:Regular Rate:Normal  01/2023: IMPRESSIONS   1. Left ventricular ejection fraction, by estimation, is 55 to 60%. The  left ventricle has normal function. The left ventricle has no regional  wall motion abnormalities. Left ventricular diastolic parameters are  consistent with Grade I diastolic  dysfunction (impaired relaxation). Elevated left ventricular end-diastolic  pressure.   2. Right ventricular systolic function is normal. The right ventricular  size is normal. There is normal pulmonary artery systolic pressure.   3. A small pericardial effusion is present. The pericardial effusion is  anterior to RA and RV.   4. The mitral valve is normal in structure. Trivial mitral valve  regurgitation. No evidence of mitral stenosis.   5. The aortic valve is tricuspid. Aortic valve regurgitation is not  visualized. Aortic valve sclerosis/calcification is present, without any  evidence of aortic stenosis. Aortic valve mean gradient measures 7.0 mmHg.  Aortic valve Vmax measures 1.78 m/s.   6. The inferior vena cava is normal in size with greater than 50%  respiratory variability, suggesting right atrial pressure of 3 mmHg.     Neuro/Psych   Anxiety     CVA  negative psych ROS   GI/Hepatic negative GI ROS, Neg liver ROS,,,Pharyngoesophageal dysphagia   Endo/Other   negative endocrine ROS    Renal/GU CRFRenal disease  negative genitourinary   Musculoskeletal negative musculoskeletal ROS (+)    Abdominal   Peds negative pediatric ROS (+)  Hematology  (+) Blood dyscrasia, anemia   Anesthesia Other Findings   Reproductive/Obstetrics negative OB ROS                             Anesthesia Physical Anesthesia Plan  ASA: 3  Anesthesia Plan: General   Post-op Pain Management:    Induction: Intravenous  PONV Risk Score and Plan: 3 and Ondansetron and Dexamethasone  Airway Management Planned: Oral ETT  Additional Equipment:   Intra-op Plan:   Post-operative Plan: Extubation in OR  Informed Consent: I have reviewed the patients History and Physical, chart, labs and discussed the procedure including the risks, benefits and alternatives for the proposed anesthesia with the patient or authorized representative who has indicated his/her understanding and acceptance.     Dental advisory given  Plan Discussed with: CRNA  Anesthesia Plan Comments:         Anesthesia Quick Evaluation

## 2023-04-21 NOTE — H&P (Signed)
Diane Carlson is an 78 y.o. female.   Chief Complaint: Dysphagia HPI: 78 year old female with dysphagia found to have narrowing of the upper esophagus on esophagram imaging.  Past Medical History:  Diagnosis Date   Allergy    Anxiety    Atypical pneumonia 09/25/2017   Cancer (HCC)    breast cancer; dx in 04/2018.    Cholesteatoma    Complication of anesthesia    "slow to wake up, couldn't catch my breath"- has had anesthesia since this occurence and did fine.    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    Vitamin D deficiency     Past Surgical History:  Procedure Laterality Date   ABDOMINAL HYSTERECTOMY     APPENDECTOMY     BREAST LUMPECTOMY Left 05/31/2018   BREAST LUMPECTOMY WITH RADIOACTIVE SEED LOCALIZATION Left 05/31/2018   Procedure: LEFT BREAST LUMPECTOMY WITH RADIOACTIVE SEED LOCALIZATION;  Surgeon: Claud Kelp, MD;  Location: [facility] OR;  Service: General;  Laterality: Left;   carotid endoectomy Left    cataract Bilateral 02/27/14   EYE SURGERY     catarat surgery bilaterally    Family History  Problem Relation Age of Onset   Heart disease Mother    Stroke Mother 68   Heart disease Father    Heart attack Father    Hypertension Son    Heart attack Paternal Aunt    Heart disease Paternal Aunt    Social History:  reports that she quit smoking about 13 years ago. Her smoking use included cigarettes. She started smoking about 33 years ago. She has a 20 pack-year smoking history. She has never used smokeless tobacco. She reports that she does not  drink alcohol and does not use drugs.  Allergies:  Allergies  Allergen Reactions   Pravastatin     Muscle aches   Zetia [Ezetimibe]     mylagias   Cephalexin Rash    Inside mouth   Sulfa Antibiotics Rash    Including mouth    Medications Prior to Admission  Medication Sig Dispense Refill   amLODipine (NORVASC) 10 MG tablet TAKE 1 TABLET EVERY DAY 90 tablet 0   anastrozole (ARIMIDEX) 1 MG tablet TAKE 1 TABLET EVERY DAY 90 tablet 3   Ascorbic Acid (VITAMIN C) 1000 MG tablet Take 1,000 mg by mouth daily.     aspirin 81 MG tablet Take 81 mg by mouth daily.     cholecalciferol (VITAMIN D) 1000 UNITS tablet Take 1,000 Units by mouth daily.     cyanocobalamin (VITAMIN B12) 500 MCG tablet Take 500 mcg by mouth daily.     ELDERBERRY PO Take by mouth.     empagliflozin (JARDIANCE) 10 MG TABS tablet Take 1 tablet (10 mg total) by mouth daily. Cancel jardiance 25mg  daily prescription 90 tablet 3   fluticasone (FLONASE) 50 MCG/ACT nasal spray Place 2 sprays into both nostrils daily. 16 g 6   Garlic 1000 MG CAPS Take 1,000 mg by mouth 2 (  two) times daily.      rosuvastatin (CRESTOR) 10 MG tablet TAKE 1 TABLET EVERY DAY 90 tablet 0   Tiotropium Bromide-Olodaterol (STIOLTO RESPIMAT) 2.5-2.5 MCG/ACT AERS Inhale 2 puffs into the lungs daily.     albuterol (VENTOLIN HFA) 108 (90 Base) MCG/ACT inhaler Inhale 2 puffs into the lungs every 6 (six) hours as needed for wheezing or shortness of breath. 8 g 2   Budeson-Glycopyrrol-Formoterol (BREZTRI AEROSPHERE) 160-9-4.8 MCG/ACT AERO Inhale 2 puffs into the lungs in the morning and at bedtime. (Patient taking differently: Inhale 2 puffs into the lungs in the morning and at bedtime. Did not ever start, too expensive) 32.1 each 5   TURMERIC PO Take by mouth.     Zinc Sulfate (ZINC 15 PO) Take by mouth.      No results found for this or any previous visit (from the past 48 hours). No results found.  Review of Systems  All other systems reviewed and are  negative.   Blood pressure 127/68, pulse 98, temperature 98.1 F (36.7 C), temperature source Oral, resp. rate 18, height 5\' 3"  (1.6 m), weight 42.8 kg, SpO2 98%. Physical Exam Constitutional:      Appearance: Normal appearance. She is normal weight.  HENT:     Head: Normocephalic and atraumatic.     Right Ear: External ear normal.     Left Ear: External ear normal.     Nose: Nose normal.     Mouth/Throat:     Mouth: Mucous membranes are moist.     Pharynx: Oropharynx is clear.  Eyes:     Extraocular Movements: Extraocular movements intact.     Conjunctiva/sclera: Conjunctivae normal.     Pupils: Pupils are equal, round, and reactive to light.  Cardiovascular:     Rate and Rhythm: Normal rate.  Pulmonary:     Effort: Pulmonary effort is normal.  Musculoskeletal:     Cervical back: Normal range of motion.  Skin:    General: Skin is warm and dry.  Neurological:     General: No focal deficit present.     Mental Status: She is alert and oriented to person, place, and time.  Psychiatric:        Mood and Affect: Mood normal.        Behavior: Behavior normal.        Thought Content: Thought content normal.        Judgment: Judgment normal.      Assessment/Plan Dysphagia and esophageal narrowing  To OR for rigid esophagoscopy with possible dilation.  Christia Reading, MD 04/21/2023, 12:19 PM

## 2023-04-21 NOTE — Discharge Instructions (Signed)

## 2023-04-21 NOTE — Anesthesia Procedure Notes (Signed)
Procedure Name: Intubation Date/Time: 04/21/2023 12:46 PM  Performed by: Yolanda Bonine, CRNAPre-anesthesia Checklist: Patient identified, Emergency Drugs available, Suction available and Patient being monitored Patient Re-evaluated:Patient Re-evaluated prior to induction Oxygen Delivery Method: Circle system utilized Preoxygenation: Pre-oxygenation with 100% oxygen Induction Type: IV induction Ventilation: Mask ventilation without difficulty Laryngoscope Size: Mac and 3 Grade View: Grade I Tube size: 6.5 mm Number of attempts: 1 Placement Confirmation: ETT inserted through vocal cords under direct vision, positive ETCO2 and breath sounds checked- equal and bilateral Secured at: 20 cm Tube secured with: Tape Dental Injury: Teeth and Oropharynx as per pre-operative assessment

## 2023-04-21 NOTE — Brief Op Note (Signed)
04/21/2023  1:04 PM  PATIENT:  Diane Carlson  78 y.o. female  PRE-OPERATIVE DIAGNOSIS:  Pharyngoesophageal dysphagia, upper esophageal narrowing  POST-OPERATIVE DIAGNOSIS:  same  PROCEDURE:  Procedure(s): RIGID ESOPHAGOSCOPY; POSSIBLE DILATION (N/A)  SURGEON:  Surgeons and Role:    Christia Reading, MD - Primary  PHYSICIAN ASSISTANT:   ASSISTANTS: none   ANESTHESIA:   general  EBL:  0 mL   BLOOD ADMINISTERED:none  DRAINS: none   LOCAL MEDICATIONS USED:  NONE  SPECIMEN:  No Specimen  DISPOSITION OF SPECIMEN:  N/A  COUNTS:  YES  TOURNIQUET:  * No tourniquets in log *  DICTATION: .Note written in EPIC  PLAN OF CARE: Discharge to home after PACU  PATIENT DISPOSITION:  PACU - hemodynamically stable.   Delay start of Pharmacological VTE agent (>24hrs) due to surgical blood loss or risk of bleeding: no

## 2023-04-22 ENCOUNTER — Encounter (HOSPITAL_BASED_OUTPATIENT_CLINIC_OR_DEPARTMENT_OTHER): Payer: Self-pay | Admitting: Otolaryngology

## 2023-04-22 NOTE — Anesthesia Postprocedure Evaluation (Signed)
Anesthesia Post Note  Patient: Diane Carlson  Procedure(s) Performed: RIGID ESOPHAGOSCOPY WITH DILATION (Throat)     Patient location during evaluation: PACU Anesthesia Type: General Level of consciousness: awake and alert Pain management: pain level controlled Vital Signs Assessment: post-procedure vital signs reviewed and stable Respiratory status: spontaneous breathing, nonlabored ventilation, respiratory function stable and patient connected to nasal cannula oxygen Cardiovascular status: blood pressure returned to baseline and stable Postop Assessment: no apparent nausea or vomiting Anesthetic complications: no   No notable events documented.  Last Vitals:  Vitals:   04/21/23 1415 04/21/23 1429  BP: 128/65 133/66  Pulse: 86 93  Resp: 20 16  Temp:  (!) 36.3 C  SpO2: 93% 93%    Last Pain:  Vitals:   04/21/23 1056  TempSrc: Oral                 Revere Nation

## 2023-05-05 NOTE — Progress Notes (Signed)
 Acute Office Visit  Subjective:     Patient ID: Diane Carlson, female    DOB: 01/19/1946, 78 y.o.   MRN: [id number]  Chief Complaint  Patient presents with   Headache    Symptoms for 3-4 days   Nasal Congestion   Cough    HPI Diane Carlson is a 78 y.o. female who complains of congestion, cough, nasal blockage, and headache for 4 days. She denies a history of anorexia, chest pain, dizziness, fatigue, fevers, myalgias, nausea, and vomiting and denies a history of asthma. Patient denies smoke cigarettes. Clinet is very rude not sure why you are doing all that, just need ATB   Active Ambulatory Problems    Diagnosis Date Noted   FH: TAH-BSO (total abdominal hysterectomy and bilateral salpingo-oophorectomy)    Paget disease of bone    Osteopenia    Essential hypertension, benign    Hyperlipidemia    CKD (chronic kidney disease) stage 4, GFR 15-29 ml/min (HCC)    Gout    Cholesteatoma    Anemia of chronic disease 08/25/2012   Allergies    Seasonal allergic rhinitis 11/29/2012   Vitamin D  deficiency    Acute bacterial rhinosinusitis 03/14/2013   Persistent pneumonia 09/25/2017   Breast mass in female 05/23/2018   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   Carcinoma of upper-inner quadrant of left breast in female, estrogen receptor positive (HCC) 06/17/2018   COPD, severe (HCC) 12/28/2019   Abnormal CT of the chest 01/01/2023   Acute viral sinusitis 05/06/2023   Congestion of nasal sinus 05/06/2023   Acute cough 05/06/2023   Resolved Ambulatory Problems    Diagnosis Date Noted   Hx of appendectomy    GERD (gastroesophageal reflux disease)    Anemia 08/25/2012   Frequency 08/25/2012   Hyperkalemia 08/25/2012   Need for prophylactic vaccination against Streptococcus pneumoniae (pneumococcus) 08/25/2012   Acute respiratory distress 09/25/2017   Failure of outpatient treatment 09/25/2017   Renal failure (ARF), acute on chronic (HCC) 09/25/2017    Dehydration 09/25/2017   Dyspnea on exertion 09/25/2017   Productive cough 09/25/2017   Leukocytosis 09/25/2017   Hyperglycemia 09/25/2017   Past Medical History:  Diagnosis Date   Allergy    Anxiety    Atypical pneumonia 09/25/2017   Cancer (HCC)    Complication of anesthesia    COPD (chronic obstructive pulmonary disease) (HCC)    Gout    Kidney disease    Paget's disease    Pneumonia    PONV (postoperative nausea and vomiting)    Stroke (HCC)     Review of Systems  Constitutional:  Negative for chills and fever.  HENT:  Positive for congestion and sinus pain.   Respiratory:  Positive for cough. Negative for sputum production, shortness of breath and wheezing.        Non productive  Cardiovascular:  Negative for chest pain and leg swelling.  Gastrointestinal:  Negative for constipation, diarrhea, nausea and vomiting.  Musculoskeletal:  Negative for myalgias.  Skin:  Negative for itching and rash.  Neurological:  Negative for dizziness and headaches.   Negative unless indicated in HPI    Objective:    BP 119/62   Pulse 95   Temp 97.7 F (36.5 C) (Temporal)   Ht 5' 3 (1.6 m)   Wt 98 lb (44.5 kg)   SpO2 94%   BMI 17.36 kg/m  BP Readings from Last 3 Encounters:  05/06/23 119/62  04/21/23 133/66  04/07/23 (!) 118/58   Wt Readings from Last 3 Encounters:  05/06/23 98 lb (44.5 kg)  04/21/23 94 lb 5.7 oz (42.8 kg)  04/07/23 97 lb 9.6 oz (44.3 kg)      Physical Exam Vitals and nursing note reviewed.  Constitutional:      General: She is not in acute distress. HENT:     Head: Normocephalic and atraumatic.     Nose:     Right Sinus: Frontal sinus tenderness present.     Left Sinus: Frontal sinus tenderness present.     Mouth/Throat:     Mouth: Mucous membranes are moist.  Eyes:     General: No scleral icterus.    Extraocular Movements: Extraocular movements intact.     Conjunctiva/sclera: Conjunctivae normal.     Pupils: Pupils are equal, round, and  reactive to light.  Cardiovascular:     Rate and Rhythm: Normal rate and regular rhythm.  Pulmonary:     Effort: Pulmonary effort is normal.  Musculoskeletal:        General: Normal range of motion.     Right lower leg: No edema.     Left lower leg: No edema.  Skin:    General: Skin is warm and dry.     Findings: No rash.  Neurological:     Mental Status: She is alert and oriented to person, place, and time. Mental status is at baseline.  Psychiatric:        Mood and Affect: Mood normal.        Behavior: Behavior normal.        Thought Content: Thought content normal.        Judgment: Judgment normal.     No results found for any visits on 05/06/23.      Assessment & Plan:  Acute non-recurrent sinusitis, unspecified location -     Azithromycin ; Take 2-tabs daily on the first day and 1-tab daily until done  Dispense: 6 each; Refill: 0  Acute cough -     Veritor Flu A/B Waived -     RSV Ag, Immunochr, Waived -     guaiFENesin ; Take 1 tablet (200 mg total) by mouth every 4 (four) hours as needed for cough or to loosen phlegm.  Dispense: 30 suppository; Refill: 0  Congestion of nasal sinus -     Veritor Flu A/B Waived -     RSV Ag, Immunochr, Waived -     Azelastine  HCl; Place 1 spray into both nostrils 2 (two) times daily. Use in each nostril as directed  Dispense: 30 mL; Refill: 12 -     guaiFENesin ; Take 1 tablet (200 mg total) by mouth every 4 (four) hours as needed for cough or to loosen phlegm.  Dispense: 30 suppository; Refill: 0   Diane Carlson is 78 yrs old female Seen Today and female seen today for sinusitis, no acute distress  viral upper respiratory illness and sinusitis Zithromax  250 mg #6 tabs; Guaifenesin  200 mg 1 tab TID for cough and congestion; Astelin  nasal spray for congestion  Symptomatic therapy suggested: push fluids, rest, and return office visit prn if symptoms persist or worsen. Lack of antibiotic effectiveness discussed with her. Call or return to clinic  prn if these symptoms worsen or fail to improve as anticipated.  Continue healthy lifestyle choices, including diet (rich in fruits, vegetables, and lean proteins, and low in salt and simple carbohydrates) and exercise (at least 30 minutes of moderate physical activity daily).  The above assessment and management plan was discussed with the patient. The patient verbalized understanding of and has agreed to the management plan. Patient is aware to call the clinic if they develop any new symptoms or if symptoms persist or worsen. Patient is aware when to return to the clinic for a follow-up visit. Patient educated on when it is appropriate to go to the emergency department.  Return if symptoms worsen or fail to improve.  Diantha Paxson [place], DNP [facility] [address] [place], [place] [address] [phone]  Note: This document was prepared by [employer] voice dictation technology and any errors that results from this process are unintentional.

## 2023-05-06 ENCOUNTER — Ambulatory Visit (INDEPENDENT_AMBULATORY_CARE_PROVIDER_SITE_OTHER): Payer: Medicare HMO | Admitting: Nurse Practitioner

## 2023-05-06 ENCOUNTER — Encounter: Payer: Self-pay | Admitting: Nurse Practitioner

## 2023-05-06 VITALS — BP 119/62 | HR 95 | Temp 97.7°F | Ht 63.0 in | Wt 98.0 lb

## 2023-05-06 DIAGNOSIS — H9193 Unspecified hearing loss, bilateral: Secondary | ICD-10-CM | POA: Diagnosis not present

## 2023-05-06 DIAGNOSIS — R0981 Nasal congestion: Secondary | ICD-10-CM | POA: Diagnosis not present

## 2023-05-06 DIAGNOSIS — J019 Acute sinusitis, unspecified: Secondary | ICD-10-CM

## 2023-05-06 DIAGNOSIS — R051 Acute cough: Secondary | ICD-10-CM | POA: Diagnosis not present

## 2023-05-06 DIAGNOSIS — B9789 Other viral agents as the cause of diseases classified elsewhere: Secondary | ICD-10-CM | POA: Insufficient documentation

## 2023-05-06 DIAGNOSIS — K222 Esophageal obstruction: Secondary | ICD-10-CM | POA: Diagnosis not present

## 2023-05-06 LAB — VERITOR FLU A/B WAIVED
Influenza A: NEGATIVE
Influenza B: NEGATIVE

## 2023-05-06 LAB — RSV AG, IMMUNOCHR, WAIVED: RSV Ag, Immunochr, Waived: NEGATIVE

## 2023-05-06 MED ORDER — AZITHROMYCIN 250 MG PO TABS: ORAL_TABLET | ORAL | 0 refills | Status: DC

## 2023-05-06 MED ORDER — GUAIFENESIN 200 MG PO TABS: 200.0000 mg | ORAL_TABLET | ORAL | 0 refills | Status: DC | PRN

## 2023-05-06 MED ORDER — AZELASTINE HCL 0.1 % NA SOLN: 1.0000 | Freq: Two times a day (BID) | NASAL | 12 refills | Status: DC

## 2023-05-18 DIAGNOSIS — M79676 Pain in unspecified toe(s): Secondary | ICD-10-CM | POA: Diagnosis not present

## 2023-05-18 DIAGNOSIS — E1142 Type 2 diabetes mellitus with diabetic polyneuropathy: Secondary | ICD-10-CM | POA: Diagnosis not present

## 2023-05-18 DIAGNOSIS — L84 Corns and callosities: Secondary | ICD-10-CM | POA: Diagnosis not present

## 2023-05-18 DIAGNOSIS — B351 Tinea unguium: Secondary | ICD-10-CM | POA: Diagnosis not present

## 2023-05-24 ENCOUNTER — Ambulatory Visit: Payer: Medicare HMO

## 2023-05-24 ENCOUNTER — Other Ambulatory Visit: Payer: Self-pay | Admitting: Family Medicine

## 2023-05-24 DIAGNOSIS — I10 Essential (primary) hypertension: Secondary | ICD-10-CM

## 2023-05-24 DIAGNOSIS — E782 Mixed hyperlipidemia: Secondary | ICD-10-CM

## 2023-05-25 ENCOUNTER — Ambulatory Visit
Admission: RE | Admit: 2023-05-25 | Discharge: 2023-05-25 | Discharge status: Home / Self Care | Payer: Medicare HMO | Source: Ambulatory Visit | Attending: Family Medicine

## 2023-05-25 ENCOUNTER — Ambulatory Visit: Payer: Medicare HMO

## 2023-05-25 DIAGNOSIS — Z1231 Encounter for screening mammogram for malignant neoplasm of breast: Secondary | ICD-10-CM | POA: Diagnosis not present

## 2023-05-25 DIAGNOSIS — Z0000 Encounter for general adult medical examination without abnormal findings: Secondary | ICD-10-CM

## 2023-05-31 ENCOUNTER — Telehealth: Payer: Self-pay | Admitting: Pulmonary Disease

## 2023-05-31 ENCOUNTER — Ambulatory Visit: Payer: Medicare HMO | Admitting: Hematology and Oncology

## 2023-05-31 NOTE — Telephone Encounter (Signed)
 PT got a call from our [facility] to sched a C today. . She sort of blew them off, she said but called again and I clarified why Dr. Javier Docker it done so please try again to sched. Thanks.

## 2023-06-01 ENCOUNTER — Telehealth: Payer: Self-pay | Admitting: Pulmonary Disease

## 2023-06-01 DIAGNOSIS — H906 Mixed conductive and sensorineural hearing loss, bilateral: Secondary | ICD-10-CM | POA: Diagnosis not present

## 2023-06-01 NOTE — Telephone Encounter (Signed)
 Patient would like to know if she starts taking the [facility]. Patient phone number is [phone] and [phone].

## 2023-06-01 NOTE — Telephone Encounter (Signed)
 Patient was scheduled by [facility]

## 2023-06-07 ENCOUNTER — Other Ambulatory Visit: Payer: Self-pay | Admitting: Family Medicine

## 2023-06-07 ENCOUNTER — Other Ambulatory Visit

## 2023-06-07 ENCOUNTER — Telehealth: Payer: Self-pay | Admitting: Family Medicine

## 2023-06-07 DIAGNOSIS — E782 Mixed hyperlipidemia: Secondary | ICD-10-CM

## 2023-06-07 DIAGNOSIS — I10 Essential (primary) hypertension: Secondary | ICD-10-CM | POA: Diagnosis not present

## 2023-06-07 DIAGNOSIS — N184 Chronic kidney disease, stage 4 (severe): Secondary | ICD-10-CM

## 2023-06-07 DIAGNOSIS — I471 Supraventricular tachycardia, unspecified: Secondary | ICD-10-CM

## 2023-06-07 NOTE — Progress Notes (Signed)
 Pt requested labs for CPE tomorrow.  Last labs collected in Oct  Orders Placed This Encounter  Procedures   CMP14+EGFR    Standing Status:   Future    Expiration Date:   06/06/2024   Lipid panel    Standing Status:   Future    Expiration Date:   06/06/2024   TSH + free T4    Standing Status:   Future    Expiration Date:   06/06/2024   CBC    Standing Status:   Future    Expiration Date:   06/06/2024   Magnesium    Standing Status:   Future    Expiration Date:   06/06/2024

## 2023-06-07 NOTE — Telephone Encounter (Signed)
 Copied from CRM [id number]. Topic: General - Other >> Jun 07, 2023  7:59 AM Izetta Dakin wrote: Reason for CRM: Patient requesting lab work

## 2023-06-07 NOTE — Telephone Encounter (Signed)
 Pt coming in this am for labs for apt tomorrow. Please add orders

## 2023-06-08 ENCOUNTER — Ambulatory Visit: Payer: Medicare HMO | Admitting: Family Medicine

## 2023-06-08 ENCOUNTER — Ambulatory Visit (INDEPENDENT_AMBULATORY_CARE_PROVIDER_SITE_OTHER)

## 2023-06-08 ENCOUNTER — Encounter: Payer: Self-pay | Admitting: Family Medicine

## 2023-06-08 VITALS — BP 132/66 | HR 100 | Temp 97.9°F | Ht 63.0 in | Wt 98.0 lb

## 2023-06-08 DIAGNOSIS — M8580 Other specified disorders of bone density and structure, unspecified site: Secondary | ICD-10-CM

## 2023-06-08 DIAGNOSIS — I10 Essential (primary) hypertension: Secondary | ICD-10-CM | POA: Diagnosis not present

## 2023-06-08 DIAGNOSIS — E782 Mixed hyperlipidemia: Secondary | ICD-10-CM | POA: Diagnosis not present

## 2023-06-08 DIAGNOSIS — C50212 Malignant neoplasm of upper-inner quadrant of left female breast: Secondary | ICD-10-CM | POA: Diagnosis not present

## 2023-06-08 DIAGNOSIS — E559 Vitamin D deficiency, unspecified: Secondary | ICD-10-CM | POA: Diagnosis not present

## 2023-06-08 DIAGNOSIS — Z170 Estrogen receptor positive status [ER+]: Secondary | ICD-10-CM

## 2023-06-08 DIAGNOSIS — I471 Supraventricular tachycardia, unspecified: Secondary | ICD-10-CM

## 2023-06-08 DIAGNOSIS — N184 Chronic kidney disease, stage 4 (severe): Secondary | ICD-10-CM

## 2023-06-08 DIAGNOSIS — Z0001 Encounter for general adult medical examination with abnormal findings: Secondary | ICD-10-CM

## 2023-06-08 DIAGNOSIS — Z0000 Encounter for general adult medical examination without abnormal findings: Secondary | ICD-10-CM

## 2023-06-08 DIAGNOSIS — Z8679 Personal history of other diseases of the circulatory system: Secondary | ICD-10-CM | POA: Diagnosis not present

## 2023-06-08 DIAGNOSIS — J441 Chronic obstructive pulmonary disease with (acute) exacerbation: Secondary | ICD-10-CM

## 2023-06-08 LAB — CBC
Hematocrit: 35.1 % (ref 34.0–46.6)
Hemoglobin: 11.5 g/dL (ref 11.1–15.9)
MCH: 29.3 pg (ref 26.6–33.0)
MCHC: 32.8 g/dL (ref 31.5–35.7)
MCV: 90 fL (ref 79–97)
Platelets: 279 10*3/uL (ref 150–450)
RBC: 3.92 x10E6/uL (ref 3.77–5.28)
RDW: 12.8 % (ref 11.7–15.4)
WBC: 8.7 10*3/uL (ref 3.4–10.8)

## 2023-06-08 LAB — CMP14+EGFR
ALT: 12 IU/L (ref 0–32)
AST: 20 IU/L (ref 0–40)
Albumin: 4.5 g/dL (ref 3.8–4.8)
Alkaline Phosphatase: 145 IU/L — ABNORMAL HIGH (ref 44–121)
BUN/Creatinine Ratio: 12 (ref 12–28)
BUN: 34 mg/dL — ABNORMAL HIGH (ref 8–27)
Bilirubin Total: 0.2 mg/dL (ref 0.0–1.2)
CO2: 21 mmol/L (ref 20–29)
Calcium: 10 mg/dL (ref 8.7–10.3)
Chloride: 104 mmol/L (ref 96–106)
Creatinine, Ser: 2.78 mg/dL — ABNORMAL HIGH (ref 0.57–1.00)
Globulin, Total: 2.4 g/dL (ref 1.5–4.5)
Glucose: 93 mg/dL (ref 70–99)
Potassium: 4.8 mmol/L (ref 3.5–5.2)
Sodium: 141 mmol/L (ref 134–144)
Total Protein: 6.9 g/dL (ref 6.0–8.5)
eGFR: 17 mL/min/{1.73_m2} — ABNORMAL LOW (ref >59)

## 2023-06-08 LAB — LIPID PANEL
Chol/HDL Ratio: 2.2 ratio (ref 0.0–4.4)
Cholesterol, Total: 138 mg/dL (ref 100–199)
HDL: 62 mg/dL (ref >39)
LDL Chol Calc (NIH): 52 mg/dL (ref 0–99)
Triglycerides: 139 mg/dL (ref 0–149)
VLDL Cholesterol Cal: 24 mg/dL (ref 5–40)

## 2023-06-08 LAB — MAGNESIUM: Magnesium: 2.3 mg/dL (ref 1.6–2.3)

## 2023-06-08 LAB — TSH+FREE T4
Free T4: 1.12 ng/dL (ref 0.82–1.77)
TSH: 0.981 u[IU]/mL (ref 0.450–4.500)

## 2023-06-08 MED ORDER — ROSUVASTATIN CALCIUM 10 MG PO TABS: 10.0000 mg | ORAL_TABLET | Freq: Every day | ORAL | 3 refills | Status: AC

## 2023-06-08 MED ORDER — AMLODIPINE BESYLATE 10 MG PO TABS: 10.0000 mg | ORAL_TABLET | Freq: Every day | ORAL | 3 refills | Status: AC

## 2023-06-08 NOTE — Patient Instructions (Signed)
 Bone Density Test: What to Expect A bone density test uses a type of X-ray to measure the amount of calcium and other minerals in your bones. It can measure bone density in the hip and the spine. This test may also be called: Bone densitometry. Bone mineral density test. Dual-energy X-ray absorptiometry (DEXA). You may have this test to: Diagnose or screen for a condition that causes weak or thin bones, called osteoporosis. See what your risk is for a broken bone, also called a fracture. Check how well your treatment for weak or thin bones is working. The test is similar to having a regular X-ray. Tell a health care provider about: Any allergies you have. All medicines you're taking, including vitamins, herbs, eye drops, creams, and over-the-counter medicines. Any problems you or family members have had with anesthesia. Any bleeding problems you have. Any surgeries you've had. Any medical conditions you have. Whether you're pregnant or may be pregnant. Any medical tests you've had within the past 14 days that used contrast. What are the risks? Your health care provider will talk with you about risks. These may include: Being exposed to a small amount of radiation. This can slightly increase your cancer risk. What happens before the test? Do not take any calcium supplements within the 24 hours before your test. You'll need to take off: All metal jewelry. Eyeglasses. Removable dental appliances. Any other metal objects on your body. What happens during the test?  You'll lie down on an exam table. There will be an X-ray machine below you and an imaging device above you. Other devices, such as boxes or braces, may be used to position your body for the scan. The machine will slowly scan your body. You'll need to keep very still while the machine does the scan. The images will show up on a screen in the room. Images will be checked by a specialist after your test is finished. These  steps may vary. Ask what you can expect. What can I expect after the test? Ask when your results will be ready and how to get them. You may need to call or meet with your provider to get your results. This information is not intended to replace advice given to you by your health care provider. Make sure you discuss any questions you have with your health care provider. Document Revised: 07/26/2022 Document Reviewed: 07/26/2022 Elsevier Patient Education  2024 [employer].

## 2023-06-08 NOTE — Progress Notes (Signed)
 Diane Carlson is a 78 y.o. female presents to office today for annual physical exam examination.    Concerns today include: 1.  None.  She reports that she has been doing fairly well.  She continues to follow-up closely with nephrology and has an appointment with her oncologist on Tuesday.  She is hoping that this is the last year she is on the Arimidex.  She denies any breast concerns and had mammogram recently.  She continues to be on the patient assistance program for [employer] and for [employer].  Has plenty of Breztri but has not been utilizing it because she was not sure if she could just switch from Stiolto to this product.  Occupation: Retired, Substance use: None Health Maintenance Due  Topic Date Due   DEXA SCAN  01/21/2022   COVID-19 Vaccine (5 - 2024-25 season) 11/29/2022   Medicare Annual Wellness (AWV)  07/01/2023   Refills needed today: None  Immunization History  Administered Date(s) Administered   Fluad Quad(high Dose 65+) 12/27/2018, 01/23/2020, 01/24/2021, 01/23/2022   Fluad Trivalent(High Dose 65+) 02/01/2023   Influenza Whole 12/28/2009   Influenza, High Dose Seasonal PF 03/02/2017   Influenza,inj,Quad PF,6+ Mos 01/15/2014, 01/11/2015, 01/07/2016, 02/21/2018   Moderna Sars-Covid-2 Vaccination 05/02/2019, 05/30/2019, 02/28/2020, 09/11/2020   Pneumococcal Conjugate-13 08/31/2014   Pneumococcal Polysaccharide-23 08/25/2012   Respiratory Syncytial Virus Vaccine,Recomb Aduvanted(Arexvy) 01/15/2022   Td 01/02/2022   Tdap 09/04/2010   Zoster Recombinant(Shingrix) 04/08/2021, 07/02/2021   Zoster, Live 03/10/2013   Past Medical History:  Diagnosis Date   Allergy    Anxiety    Atypical pneumonia 09/25/2017   Cancer (HCC)    breast cancer; dx in 04/2018.    Cholesteatoma    Complication of anesthesia    "slow to wake up, couldn't catch my breath"- has had anesthesia since this occurence and did fine.    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[employer]  Tobacco Use   Smoking status: Former    Current packs/day: 0.00    Average packs/day: 1 pack/day for 20.0 years (20.0 ttl pk-yrs)    Types: Cigarettes    Start date: 08/25/1989    Quit date: 08/25/2009    Years since quitting: 13.7   Smokeless tobacco: Never  Vaping Use   Vaping status: Never Used  Substance and Sexual Activity   Alcohol use: No   Drug use: No   Sexual activity: Not Currently    Birth control/protection: Surgical    Comment: Hyst  Other Topics Concern   Not on file  Social History Narrative   Lives alone   She exercises everyday and enjoys  zumba   Social Drivers of Health   Financial Resource Strain: Low Risk  (06/08/2023)   Overall Financial Resource Strain (CARDIA)    Difficulty of Paying Living Expenses: Not very hard  Food Insecurity: No Food Insecurity (06/08/2023)   Hunger Vital Sign    Worried About Running Out of Food in the Last Year: Never true    Ran Out of Food in the Last Year: Never true  Transportation Needs: No Transportation Needs (06/08/2023)   PRAPARE - Administrator, Civil Service (Medical): No    Lack of Transportation (Non-Medical): No  Physical Activity: Sufficiently Active  (06/08/2023)   Exercise Vital Sign    Days of Exercise per Week: 5 days    Minutes of Exercise per Session: 60 min  Stress: No Stress Concern Present (06/08/2023)   [employer] of Occupational Health - Occupational Stress Questionnaire    Feeling of Stress : Not at all  Social Connections: Moderately Integrated (06/08/2023)   Social Connection and Isolation Panel [NHANES]    Frequency of Communication with Friends and Family: More than three times a week    Frequency of Social Gatherings with Friends and Family: More than three times a week    Attends Religious Services: More than 4 times per year    Active Member of [employer] or Organizations: Yes    Attends Banker Meetings: More than 4 times per year    Marital Status: Widowed  Intimate Partner Violence: Not At Risk (06/08/2023)   Humiliation, Afraid, Rape, and Kick questionnaire    Fear of Current or Ex-Partner: No    Emotionally Abused: No    Physically Abused: No    Sexually Abused: No   Past Surgical History:  Procedure Laterality Date   ABDOMINAL HYSTERECTOMY     APPENDECTOMY     BREAST LUMPECTOMY Left 05/31/2018   BREAST LUMPECTOMY WITH RADIOACTIVE SEED LOCALIZATION Left 05/31/2018   Procedure: LEFT BREAST LUMPECTOMY WITH RADIOACTIVE SEED LOCALIZATION;  Surgeon: Claud Kelp, MD;  Location: [facility] OR;  Service: General;  Laterality: Left;   carotid endoectomy Left    cataract Bilateral 02/27/14   ESOPHAGOSCOPY WITH DILITATION N/A 04/21/2023   Procedure: RIGID ESOPHAGOSCOPY WITH DILATION;  Surgeon: Christia Reading, MD;  Location:  SURGERY CENTER;  Service: ENT;  Laterality: N/A;   EYE SURGERY     catarat surgery bilaterally   Family History  Problem Relation Age of Onset   Heart disease Mother    Stroke Mother 44   Heart disease Father    Heart attack Father    Hypertension Son    Heart attack Paternal Aunt    Heart disease Paternal Aunt     Current Outpatient Medications:    albuterol (VENTOLIN  HFA) 108 (90 Base) MCG/ACT inhaler, Inhale 2 puffs into the lungs every 6 (six) hours as needed for wheezing or shortness of breath., Disp: 8 g, Rfl: 2   amLODipine (NORVASC) 10 MG tablet, TAKE 1 TABLET EVERY DAY, Disp: 90 tablet, Rfl: 0   anastrozole (ARIMIDEX) 1 MG tablet, TAKE 1 TABLET EVERY DAY, Disp: 90 tablet, Rfl: 3   Ascorbic Acid (VITAMIN C) 1000 MG tablet, Take 1,000 mg by mouth daily., Disp: , Rfl:    aspirin 81 MG tablet, Take 81 mg by mouth daily., Disp: , Rfl:    azelastine (ASTELIN) 0.1 % nasal spray, Place 1 spray into both nostrils 2 (two) times daily. Use in each nostril as directed, Disp: 30 mL, Rfl: 12  azithromycin (ZITHROMAX Z-PAK) 250 MG tablet, Take 2-tabs daily on the first day and 1-tab daily until done, Disp: 6 each, Rfl: 0   Budeson-Glycopyrrol-Formoterol (BREZTRI AEROSPHERE) 160-9-4.8 MCG/ACT AERO, Inhale 2 puffs into the lungs in the morning and at bedtime. (Patient taking differently: Inhale 2 puffs into the lungs in the morning and at bedtime. Did not ever start, too expensive), Disp: 32.1 each, Rfl: 5   cholecalciferol (VITAMIN D) 1000 UNITS tablet, Take 1,000 Units by mouth daily., Disp: , Rfl:    cyanocobalamin (VITAMIN B12) 500 MCG tablet, Take 500 mcg by mouth daily., Disp: , Rfl:    ELDERBERRY PO, Take by mouth., Disp: , Rfl:    empagliflozin (JARDIANCE) 10 MG TABS tablet, Take 1 tablet (10 mg total) by mouth daily. Cancel jardiance 25mg  daily prescription, Disp: 90 tablet, Rfl: 3   fluticasone (FLONASE) 50 MCG/ACT nasal spray, Place 2 sprays into both nostrils daily., Disp: 16 g, Rfl: 6   Garlic 1000 MG CAPS, Take 1,000 mg by mouth 2 (two) times daily. , Disp: , Rfl:    guaiFENesin 200 MG tablet, Take 1 tablet (200 mg total) by mouth every 4 (four) hours as needed for cough or to loosen phlegm., Disp: 30 suppository, Rfl: 0   rosuvastatin (CRESTOR) 10 MG tablet, TAKE 1 TABLET EVERY DAY, Disp: 90 tablet, Rfl: 0   Tiotropium Bromide-Olodaterol (STIOLTO RESPIMAT)  2.5-2.5 MCG/ACT AERS, Inhale 2 puffs into the lungs daily., Disp: , Rfl:    TURMERIC PO, Take by mouth., Disp: , Rfl:    Zinc Sulfate (ZINC 15 PO), Take by mouth., Disp: , Rfl:   Allergies  Allergen Reactions   Pravastatin     Muscle aches   Zetia [Ezetimibe]     mylagias   Cephalexin Rash    Inside mouth   Sulfa Antibiotics Rash    Including mouth     ROS: Review of Systems Pertinent items noted in HPI and remainder of comprehensive ROS otherwise negative.    Physical exam BP 132/66   Pulse 100   Temp 97.9 F (36.6 C)   Ht 5\' 3"  (1.6 m)   Wt 98 lb (44.5 kg)   SpO2 97%   BMI 17.36 kg/m  General appearance: alert, cooperative, appears stated age, and no distress Head: Normocephalic, without obvious abnormality, atraumatic Eyes: negative findings: lids and lashes normal, conjunctivae and sclerae normal, corneas clear, and pupils equal, round, reactive to light and accomodation Ears: normal TM's and external ear canals both ears Nose: Nares normal. Septum midline. Mucosa normal. No drainage or sinus tenderness. Throat: lips, mucosa, and tongue normal; teeth and gums normal Neck: no adenopathy, supple, symmetrical, trachea midline, and thyroid not enlarged, symmetric, no tenderness/mass/nodules Back:  Increased thoracic kyphosis present.  No midline tenderness palpation to the spine Lungs:  Globally decreased breath sounds with no appreciable wheezes, rhonchi or rales.  She has normal work of breathing on room air Heart: regular rate and rhythm, S1, S2 normal, no murmur, click, rub or gallop Abdomen: soft, non-tender; bowel sounds normal; no masses,  no organomegaly Extremities: extremities normal, atraumatic, no cyanosis or edema Pulses: 2+ and symmetric Skin: Skin color, texture, turgor normal. No rashes or lesions Lymph nodes: Cervical, supraclavicular, and axillary nodes normal. Neurologic: Grossly normal      06/08/2023   10:24 AM 01/04/2023    3:43 PM 12/11/2022    10:41 AM  Depression screen PHQ 2/9  Decreased Interest 0 0 0  Down, Depressed, Hopeless 0 0 0  PHQ - 2 Score 0 0 0  Altered sleeping 0 0 0  Tired, decreased energy 0 0 0  Change in appetite 0 0 0  Feeling bad or failure about yourself  0 0 0  Trouble concentrating 0 0 0  Moving slowly or fidgety/restless 0 0 0  Suicidal thoughts 0 0 0  PHQ-9 Score 0 0 0  Difficult doing work/chores Not difficult at all Not difficult at all Not difficult at all      06/08/2023   10:24 AM 01/04/2023    3:43 PM 07/06/2022    8:01 AM 04/28/2022    8:57 AM  GAD 7 : Generalized Anxiety Score  Nervous, Anxious, on Edge 0 0 0 0  Control/stop worrying 0 0 0 0  Worry too much - different things 0 0 0 0  Trouble relaxing 0 0 0 0  Restless 0 0 0 0  Easily annoyed or irritable 0 0 0 0  Afraid - awful might happen 0 0 0 0  Total GAD 7 Score 0 0 0 0  Anxiety Difficulty Not difficult at all   Not difficult at all   Results for orders placed or performed in visit on 06/07/23 (from the past 48 hours)  Magnesium     Status: None   Collection Time: 06/07/23  9:45 AM  Result Value Ref Range   Magnesium 2.3 1.6 - 2.3 mg/dL  CBC     Status: None   Collection Time: 06/07/23  9:45 AM  Result Value Ref Range   WBC 8.7 3.4 - 10.8 x10E3/uL   RBC 3.92 3.77 - 5.28 x10E6/uL   Hemoglobin 11.5 11.1 - 15.9 g/dL   Hematocrit 69.6 29.5 - 46.6 %   MCV 90 79 - 97 fL   MCH 29.3 26.6 - 33.0 pg   MCHC 32.8 31.5 - 35.7 g/dL   RDW 28.4 13.2 - 44.0 %   Platelets 279 150 - 450 x10E3/uL  TSH + free T4     Status: None   Collection Time: 06/07/23  9:45 AM  Result Value Ref Range   TSH 0.981 0.450 - 4.500 uIU/mL   Free T4 1.12 0.82 - 1.77 ng/dL  Lipid panel     Status: None   Collection Time: 06/07/23  9:45 AM  Result Value Ref Range   Cholesterol, Total 138 100 - 199 mg/dL   Triglycerides 102 0 - 149 mg/dL   HDL 62 >72 mg/dL   VLDL Cholesterol Cal 24 5 - 40 mg/dL   LDL Chol Calc (NIH) 52 0 - 99 mg/dL   Chol/HDL Ratio 2.2  0.0 - 4.4 ratio    Comment:                                   T. Chol/HDL Ratio                                             Men  Women                               1/2 Avg.Risk  3.4    3.3  Avg.Risk  5.0    4.4                                2X Avg.Risk  9.6    7.1                                3X Avg.Risk 23.4   11.0   CMP14+EGFR     Status: Abnormal   Collection Time: 06/07/23  9:45 AM  Result Value Ref Range   Glucose 93 70 - 99 mg/dL   BUN 34 (H) 8 - 27 mg/dL   Creatinine, Ser 6.04 (H) 0.57 - 1.00 mg/dL   eGFR 17 (L) >54 UJ/WJX/9.14   BUN/Creatinine Ratio 12 12 - 28   Sodium 141 134 - 144 mmol/L   Potassium 4.8 3.5 - 5.2 mmol/L   Chloride 104 96 - 106 mmol/L   CO2 21 20 - 29 mmol/L   Calcium 10.0 8.7 - 10.3 mg/dL   Total Protein 6.9 6.0 - 8.5 g/dL   Albumin 4.5 3.8 - 4.8 g/dL   Globulin, Total 2.4 1.5 - 4.5 g/dL   Bilirubin Total 0.2 0.0 - 1.2 mg/dL   Alkaline Phosphatase 145 (H) 44 - 121 IU/L   AST 20 0 - 40 IU/L   ALT 12 0 - 32 IU/L     Assessment/ Plan: Noel Journey here for annual physical exam.   Annual physical exam  Essential hypertension, benign - Plan: amLODipine (NORVASC) 10 MG tablet  CKD (chronic kidney disease) stage 4, GFR 15-29 ml/min (HCC) - Plan: DG [facility] DEXA  Mixed hyperlipidemia - Plan: rosuvastatin (CRESTOR) 10 MG tablet  Carcinoma of upper-inner quadrant of left breast in female, estrogen receptor positive (HCC) - Plan: DG [facility] DEXA  History of supraventricular tachycardia  Vitamin D deficiency - Plan: DG [facility] DEXA  We reviewed her labs which were collected yesterday.  Her blood pressure is controlled.  No changes.  Continue to follow-up with nephrology, pulmonology and hematology/oncology as directed.  We will get her DEXA scan collected today given history of hormone suppression, vitamin D deficiency and CKD.  Counseled on healthy lifestyle choices, including diet (rich in fruits, vegetables and lean  meats and low in salt and simple carbohydrates) and exercise (at least 30 minutes of moderate physical activity daily).  Patient to follow up 75m  Maryetta Shafer M. Nadine Counts, DO

## 2023-06-09 DIAGNOSIS — M85852 Other specified disorders of bone density and structure, left thigh: Secondary | ICD-10-CM | POA: Diagnosis not present

## 2023-06-09 DIAGNOSIS — Z780 Asymptomatic menopausal state: Secondary | ICD-10-CM | POA: Diagnosis not present

## 2023-06-10 ENCOUNTER — Encounter: Payer: Self-pay | Admitting: *Deleted

## 2023-06-11 ENCOUNTER — Encounter: Payer: Self-pay | Admitting: Family Medicine

## 2023-06-14 ENCOUNTER — Inpatient Hospital Stay: Payer: Medicare HMO | Attending: Hematology and Oncology | Admitting: Hematology and Oncology

## 2023-06-14 VITALS — BP 136/62 | HR 87 | Temp 97.6°F | Resp 17 | Ht 63.0 in | Wt 97.2 lb

## 2023-06-14 DIAGNOSIS — Z170 Estrogen receptor positive status [ER+]: Secondary | ICD-10-CM | POA: Insufficient documentation

## 2023-06-14 DIAGNOSIS — M85859 Other specified disorders of bone density and structure, unspecified thigh: Secondary | ICD-10-CM | POA: Diagnosis not present

## 2023-06-14 DIAGNOSIS — Z1722 Progesterone receptor negative status: Secondary | ICD-10-CM | POA: Diagnosis not present

## 2023-06-14 DIAGNOSIS — Z1732 Human epidermal growth factor receptor 2 negative status: Secondary | ICD-10-CM | POA: Diagnosis not present

## 2023-06-14 DIAGNOSIS — C50212 Malignant neoplasm of upper-inner quadrant of left female breast: Secondary | ICD-10-CM | POA: Diagnosis not present

## 2023-06-14 DIAGNOSIS — Z79811 Long term (current) use of aromatase inhibitors: Secondary | ICD-10-CM | POA: Insufficient documentation

## 2023-06-14 NOTE — Assessment & Plan Note (Signed)
 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 Current treatment: Adjuvant anastrozole 1 mg daily started 06/2018 (plan is 5 years of antiestrogen therapy)   Anastrozole Toxicities: Occasional hot flashes   Breast cancer surveillance: 1.  Breast exam 06/14/2023: Benign 2.  Mammogram 05/27/2023 benign breast density category B Bone density 06/10/2023: T-score -2.4: Significant osteopenia: Recommended calcium vitamin D and bisphosphonate therapy and weightbearing exercises.    Return to clinic in 1 year for follow-up

## 2023-06-14 NOTE — Progress Notes (Signed)
 Patient Care Team: Raliegh Ip, DO as PCP - General (Family Medicine) Iran Ouch, MD as PCP - [facility] Cardiology (Cardiology) Arita Miss, MD as Attending Physician (Nephrology) Serena Croissant, MD as Consulting Physician (Hematology and Oncology) Claud Kelp, MD as Consulting Physician (General Surgery) [facility] Danella Maiers, [facility] as Pharmacist (Family Medicine) Parke Poisson, MD as Consulting Physician (Cardiology)  DIAGNOSIS:  Encounter Diagnosis  Name Primary?   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  History of Present Illness The patient, with a history of breast cancer, presents for a follow-up visit after completing a five-year course of Anastrozole. She reports no pain or discomfort in her chest and denies any adverse effects from the medication, such as hot flashes.  In addition to her cancer history, the patient has been experiencing decreased energy and difficulty swallowing. She attributes the energy loss to aging and muscle loss. The swallowing difficulty has been addressed by a doctor who performed a procedure to stretch her  esophagus, which has significantly improved her symptoms.  The patient also underwent a bone density test. She has not yet discussed the results with her primary care doctor.     ALLERGIES:  is allergic to pravastatin, zetia [ezetimibe], cephalexin, and sulfa antibiotics.  MEDICATIONS:  Current Outpatient Medications  Medication Sig Dispense Refill   albuterol (VENTOLIN HFA) 108 (90 Base) MCG/ACT inhaler Inhale 2 puffs into the lungs every 6 (six) hours as needed for wheezing or shortness of breath. 8 g 2   amLODipine (NORVASC) 10 MG tablet Take 1 tablet (10 mg total) by mouth daily. 100 tablet 3   anastrozole (ARIMIDEX) 1 MG tablet TAKE 1 TABLET EVERY DAY 90 tablet 3   Ascorbic Acid (VITAMIN C) 1000 MG tablet Take 1,000 mg by mouth daily.     aspirin 81 MG tablet Take 81 mg by mouth daily.     azelastine (ASTELIN) 0.1 % nasal spray Place 1 spray into both nostrils 2 (two) times daily. Use in each nostril as directed 30 mL 12   Budeson-Glycopyrrol-Formoterol (BREZTRI AEROSPHERE) 160-9-4.8 MCG/ACT AERO Inhale 2 puffs into the lungs in the morning and at bedtime. (Patient taking differently: Inhale 2 puffs into the lungs in the morning and at bedtime. Did not ever start, too expensive) 32.1 each 5   cholecalciferol (VITAMIN D) 1000 UNITS tablet Take 1,000 Units by mouth daily.     cyanocobalamin (VITAMIN B12) 500 MCG tablet Take 500 mcg by mouth daily.     ELDERBERRY PO Take by mouth.     empagliflozin (JARDIANCE) 10 MG TABS tablet Take 1 tablet (10 mg total) by mouth daily. Cancel jardiance 25mg   daily prescription 90 tablet 3   fluticasone (FLONASE) 50 MCG/ACT nasal spray Place 2 sprays into both nostrils daily. 16 g 6   Garlic 1000 MG CAPS Take 1,000 mg by mouth 2 (two) times daily.      rosuvastatin (CRESTOR) 10 MG tablet Take 1 tablet (10 mg total) by mouth daily. 100 tablet 3   Tiotropium Bromide-Olodaterol (STIOLTO RESPIMAT) 2.5-2.5 MCG/ACT AERS Inhale 2 puffs into the lungs daily.      TURMERIC PO Take by mouth.     Zinc Sulfate (ZINC 15 PO) Take by mouth.     No current facility-administered medications for this visit.    PHYSICAL EXAMINATION: ECOG PERFORMANCE STATUS: 1 - Symptomatic but completely ambulatory  Vitals:   06/14/23 1117  BP: 136/62  Pulse: 87  Resp: 17  Temp: 97.6 F (36.4 C)  SpO2: 93%   Filed Weights   06/14/23 1117  Weight: 97 lb 3.2 oz (44.1 kg)    Physical Exam BREAST: Breasts normal on palpation.  (exam performed in the presence of a chaperone)  LABORATORY DATA:  I have reviewed the data as listed    Latest Ref Rng & Units 06/07/2023    9:45 AM 12/31/2022    9:18 AM 01/02/2022    8:52 AM  CMP  Glucose 70 - 99 mg/dL 93  99  161   BUN 8 - 27 mg/dL 34  38  27   Creatinine 0.57 - 1.00 mg/dL 0.96  0.45  4.09   Sodium 134 - 144 mmol/L 141  141  142   Potassium 3.5 - 5.2 mmol/L 4.8  4.3  5.7   Chloride 96 - 106 mmol/L 104  104  102   CO2 20 - 29 mmol/L 21  20  24    Calcium 8.7 - 10.3 mg/dL 81.1  9.7  91.4   Total Protein 6.0 - 8.5 g/dL 6.9  6.8  7.5   Total Bilirubin 0.0 - 1.2 mg/dL 0.2  0.3  0.3   Alkaline Phos 44 - 121 IU/L 145  124  134   AST 0 - 40 IU/L 20  18  27    ALT 0 - 32 IU/L 12  17  15      Lab Results  Component Value Date   WBC 8.7 06/07/2023   HGB 11.5 06/07/2023   HCT 35.1 06/07/2023   MCV 90 06/07/2023   PLT 279 06/07/2023   NEUTROABS 6.7 01/04/2023    ASSESSMENT & PLAN:  Carcinoma of upper-inner quadrant of left breast in female, estrogen receptor positive (HCC) 05/31/2018:Left lumpectomy: 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 Current treatment: Adjuvant anastrozole 1 mg daily started 06/2018 (plan is 5 years of antiestrogen therapy)   Anastrozole Toxicities: Occasional hot flashes Since she completed 5 years of antiestrogen therapy recommended  that she discontinue the anastrozole at this time.   Breast cancer surveillance: 1.  Breast exam 06/14/2023: Benign 2.  Mammogram 05/27/2023 benign breast density category B Bone density 06/10/2023: T-score -2.4: Significant osteopenia: Recommended calcium vitamin D and bisphosphonate therapy and weightbearing exercises.  She will discuss with her primary care physician about Prolia injections.    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 Transitioning to as-needed follow-up is appropriate. - Discontinue anastrozole. - Continue annual mammograms. - Transition to as-needed follow-up with oncology.  Osteopenia Bone density test shows hip score of -2.4, indicating osteopenia near osteoporosis threshold. Coordination for bone-strengthening injections recommended. - Coordinate with primary care physician for potential bone-strengthening injections.      No orders of the defined types were placed in this encounter.  The patient has a good understanding of the overall plan. she agrees with it. she will call with any problems that may develop before the next visit here. Total time spent: 30 mins including face to face time and time spent for planning, charting and co-ordination of care   Tamsen Meek, MD 06/14/23

## 2023-07-02 ENCOUNTER — Ambulatory Visit (INDEPENDENT_AMBULATORY_CARE_PROVIDER_SITE_OTHER): Payer: Medicare HMO

## 2023-07-02 VITALS — Ht 63.0 in | Wt 97.0 lb

## 2023-07-02 DIAGNOSIS — Z0000 Encounter for general adult medical examination without abnormal findings: Secondary | ICD-10-CM

## 2023-07-02 NOTE — Progress Notes (Signed)
 Subjective:   Diane Carlson is a 78 y.o. who presents for a Medicare Wellness preventive visit.  Visit Complete: Virtual I connected with  Diane Carlson on 07/02/23 by a audio enabled telemedicine application and verified that I am speaking with the correct person using two identifiers.  Patient Location: Home  Provider Location: Home Office  I discussed the limitations of evaluation and management by telemedicine. The patient expressed understanding and agreed to proceed.  Vital Signs: Because this visit was a virtual/telehealth visit, some criteria may be missing or patient reported. Any vitals not documented were not able to be obtained and vitals that have been documented are patient reported.  VideoDeclined- This patient declined Librarian, academic. Therefore the visit was completed with audio only.  Persons Participating in Visit: Patient.  AWV Questionnaire: No: Patient Medicare AWV questionnaire was not completed prior to this visit.  Cardiac Risk Factors include: advanced age (>3men, >6 women);hypertension     Objective:    Today's Vitals   07/02/23 1549  Weight: 97 lb (44 kg)  Height: 5\' 3"  (1.6 m)   Body mass index is 17.18 kg/m.     07/02/2023    3:53 PM 04/21/2023   10:51 AM 04/14/2023   11:59 AM 07/01/2022    1:51 PM 12/23/2020    2:25 PM 12/22/2019    9:57 AM 12/09/2018   10:57 AM  Advanced Directives  Does Patient Have a Medical Advance Directive? No Yes Yes Yes Yes Yes No  Type of Special educational needs teacher of [place];Living will Healthcare Power of [place];Living will Healthcare Power of [place];Living will Healthcare Power of [place];Living will Healthcare Power of [place];Living will   Does patient want to make changes to medical advance directive?  No - Patient declined    No - Patient declined   Copy of Healthcare Power of Attorney in Chart?    No - copy requested No - copy requested    Would patient  like information on creating a medical advance directive? Yes (MAU/Ambulatory/Procedural Areas - Information given)      No - Patient declined    Current Medications (verified) Outpatient Encounter Medications as of 07/02/2023  Medication Sig   albuterol (VENTOLIN HFA) 108 (90 Base) MCG/ACT inhaler Inhale 2 puffs into the lungs every 6 (six) hours as needed for wheezing or shortness of breath.   amLODipine (NORVASC) 10 MG tablet Take 1 tablet (10 mg total) by mouth daily.   Ascorbic Acid (VITAMIN C) 1000 MG tablet Take 1,000 mg by mouth daily.   aspirin 81 MG tablet Take 81 mg by mouth daily.   azelastine (ASTELIN) 0.1 % nasal spray Place 1 spray into both nostrils 2 (two) times daily. Use in each nostril as directed   Budeson-Glycopyrrol-Formoterol (BREZTRI AEROSPHERE) 160-9-4.8 MCG/ACT AERO Inhale 2 puffs into the lungs in the morning and at bedtime. (Patient taking differently: Inhale 2 puffs into the lungs in the morning and at bedtime. Did not ever start, too expensive)   cholecalciferol (VITAMIN D) 1000 UNITS tablet Take 1,000 Units by mouth daily.   cyanocobalamin (VITAMIN B12) 500 MCG tablet Take 500 mcg by mouth daily.   ELDERBERRY PO Take by mouth.   empagliflozin (JARDIANCE) 10 MG TABS tablet Take 1 tablet (10 mg total) by mouth daily. Cancel jardiance 25mg  daily prescription   fluticasone (FLONASE) 50 MCG/ACT nasal spray Place 2 sprays into both nostrils daily.   Garlic 1000 MG CAPS Take 1,000 mg by mouth 2 (  two) times daily.    rosuvastatin (CRESTOR) 10 MG tablet Take 1 tablet (10 mg total) by mouth daily.   Tiotropium Bromide-Olodaterol (STIOLTO RESPIMAT) 2.5-2.5 MCG/ACT AERS Inhale 2 puffs into the lungs daily.   TURMERIC PO Take by mouth.   Zinc Sulfate (ZINC 15 PO) Take by mouth.   No facility-administered encounter medications on file as of 07/02/2023.    Allergies (verified) Pravastatin, Zetia [ezetimibe], Cephalexin, and Sulfa antibiotics   History: Past Medical History:   Diagnosis Date   Allergy    Anxiety    Atypical pneumonia 09/25/2017   Cancer (HCC)    breast cancer; dx in 04/2018.    Cholesteatoma    Complication of anesthesia    "slow to wake up, couldn't catch my breath"- has had anesthesia since this occurence and did fine.    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    Vitamin D deficiency    Past Surgical History:  Procedure Laterality Date   ABDOMINAL HYSTERECTOMY     APPENDECTOMY     BREAST LUMPECTOMY Left 05/31/2018   BREAST LUMPECTOMY WITH RADIOACTIVE SEED LOCALIZATION Left 05/31/2018   Procedure: LEFT BREAST LUMPECTOMY WITH RADIOACTIVE SEED LOCALIZATION;  Surgeon: Claud Kelp, MD;  Location: [facility] OR;  Service: General;  Laterality: Left;   carotid endoectomy Left    cataract Bilateral 02/27/14   ESOPHAGOSCOPY WITH DILITATION N/A 04/21/2023   Procedure: RIGID ESOPHAGOSCOPY WITH DILATION;  Surgeon: Christia Reading, MD;  Location: [facility];  Service: ENT;  Laterality: N/A;   EYE SURGERY     catarat surgery bilaterally   Family History  Problem Relation Age of Onset   Heart disease Mother    Stroke Mother 52   Heart disease Father    Heart attack Father    Hypertension Son    Heart attack Paternal Aunt    Heart disease Paternal Aunt    Social History   Socioeconomic History   Marital status: Widowed    Spouse name: Not on file   Number of children: 2   Years of education: cosemtology certificate   Highest education level: Some college, no degree  Occupational History   Occupation:  Retired    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[employer]  Tobacco Use   Smoking status: Former    Current packs/day: 0.00    Average packs/day: 1 pack/day for 20.0 years (20.0 ttl pk-yrs)    Types: Cigarettes    Start date: 08/25/1989    Quit date: 08/25/2009    Years since quitting: 13.8   Smokeless tobacco: Never  Vaping Use   Vaping status: Never Used  Substance and Sexual Activity   Alcohol use: No   Drug use: No   Sexual activity: Not Currently    Birth control/protection: Surgical    Comment: Hyst  Other Topics Concern   Not on file  Social History Narrative   Lives alone   She exercises everyday and enjoys zumba   Social Drivers of Dispensing optician  Resource Strain: Low Risk  (07/02/2023)   Overall Financial Resource Strain (CARDIA)    Difficulty of Paying Living Expenses: Not hard at all  Food Insecurity: No Food Insecurity (07/02/2023)   Hunger Vital Sign    Worried About Running Out of Food in the Last Year: Never true    Ran Out of Food in the Last Year: Never true  Transportation Needs: No Transportation Needs (07/02/2023)   PRAPARE - Administrator, Civil Service (Medical): No    Lack of Transportation (Non-Medical): No  Physical Activity: Sufficiently Active (07/02/2023)   Exercise Vital Sign    Days of Exercise per Week: 5 days    Minutes of Exercise per Session: 60 min  Stress: No Stress Concern Present (07/02/2023)   [employer] of Occupational Health - Occupational Stress Questionnaire    Feeling of Stress : Not at all  Social Connections: Moderately Integrated (07/02/2023)   Social Connection and Isolation Panel [NHANES]    Frequency of Communication with Friends and Family: More than three times a week    Frequency of Social Gatherings with Friends and Family: Three times a week    Attends Religious Services: More than 4 times per year    Active Member of Clubs or Organizations: Yes    Attends Banker Meetings: More than 4 times per  year    Marital Status: Widowed    Tobacco Counseling Counseling given: Not Answered    Clinical Intake:  Pre-visit preparation completed: Yes  Pain : No/denies pain     Diabetes: No  No results found for: "HGBA1C"   How often do you need to have someone help you when you read instructions, pamphlets, or other written materials from your doctor or pharmacy?: 1 - Never  Interpreter Needed?: No  Information entered by :: Kandis Fantasia LPN   Activities of Daily Living     07/02/2023    3:53 PM 04/21/2023   10:54 AM  In your present state of health, do you have any difficulty performing the following activities:  Hearing? 0 0  Vision? 0 0  Difficulty concentrating or making decisions? 0 0  Walking or climbing stairs? 0   Dressing or bathing? 0   Doing errands, shopping? 0   Preparing Food and eating ? N   Using the Toilet? N   In the past six months, have you accidently leaked urine? N   Do you have problems with loss of bowel control? N   Managing your Medications? N   Managing your Finances? N   Housekeeping or managing your Housekeeping? N     Patient Care Team: Raliegh Ip, DO as PCP - General (Family Medicine) Iran Ouch, MD as PCP - [facility] (Cardiology) Arita Miss, MD as Attending Physician (Nephrology) Serena Croissant, MD as Consulting Physician (Hematology and Oncology) Claud Kelp, MD as Consulting Physician (General Surgery) [facility] Danella Maiers, RPH as Pharmacist (Family Medicine) Parke Poisson, MD as Consulting Physician (Cardiology)  Indicate any recent Medical Services you may have received from other than [facility] providers in the past year (date may be approximate).     Assessment:   This is a routine wellness examination for Diane Carlson.  Hearing/Vision screen Hearing Screening - Comments:: Some hearing loss; followed by ENT and audiology; no hearing aids  Vision Screening - Comments:: Wears  rx glasses - up to date with routine eye exams with [facility]  Goals Addressed   None    Depression Screen     07/02/2023    3:51 PM 06/08/2023   10:24 AM 01/04/2023    3:43 PM 12/11/2022   10:41 AM 07/06/2022    8:01 AM 07/01/2022    1:50 PM 04/28/2022    8:57 AM  PHQ 2/9 Scores  PHQ - 2 Score 0 0 0 0 0 0 0  PHQ- 9 Score 0 0 0 0 0  0    Fall Risk     07/02/2023    3:53 PM 06/08/2023   10:31 AM 01/04/2023    3:43 PM 07/06/2022    8:01 AM 07/01/2022    1:49 PM  Fall Risk   Falls in the past year? 0 0 0 0 0  Number falls in past yr: 0 0 0 0 0  Injury with Fall? 0 0 0 0 0  Risk for fall due to : No Fall Risks No Fall Risks No Fall Risks No Fall Risks No Fall Risks  Follow up Falls prevention discussed;Education provided;Falls evaluation completed Falls evaluation completed Education provided Education provided Falls prevention discussed    MEDICARE RISK AT HOME:  Medicare Risk at Home Any stairs in or around the home?: No If so, are there any without handrails?: No Home free of loose throw rugs in walkways, pet beds, electrical cords, etc?: Yes Adequate lighting in your home to reduce risk of falls?: Yes Life alert?: No Use of a cane, walker or w/c?: No Grab bars in the bathroom?: Yes Shower chair or bench in shower?: No Elevated toilet seat or a handicapped toilet?: Yes  TIMED UP AND GO:  Was the test performed?  No  Cognitive Function: 6CIT completed    08/04/2017   11:59 AM 07/29/2016    8:57 AM  MMSE - Mini Mental State Exam  Orientation to time 5 5  Orientation to Place 5 5  Registration 3 3  Attention/ Calculation 4 5  Recall 3 3  Language- name 2 objects 2 2  Language- repeat 1 1  Language- follow 3 step command 3 3  Language- read & follow direction 1 1  Write a sentence 1 1  Copy design 0 1  Total score 28 30        07/02/2023    3:53 PM 07/01/2022    1:51 PM 12/23/2020    2:24 PM 12/22/2019    9:59 AM 12/09/2018   11:01 AM  6CIT Screen  What Year? 0  points 0 points 0 points 0 points 0 points  What month? 0 points 0 points 0 points 0 points 0 points  What time? 0 points 0 points 0 points 0 points 0 points  Count back from 20 0 points 0 points 0 points 0 points 0 points  Months in reverse 0 points 0 points 4 points 4 points 0 points  Repeat phrase 0 points 0 points 0 points 0 points 0 points  Total Score 0 points 0 points 4 points 4 points 0 points    Immunizations Immunization History  Administered Date(s) Administered   Fluad Quad(high Dose 65+) 12/27/2018, 01/23/2020, 01/24/2021, 01/23/2022   Fluad Trivalent(High Dose 65+) 02/01/2023   Influenza Whole 12/28/2009   Influenza, High Dose Seasonal PF 03/02/2017   Influenza,inj,Quad PF,6+ Mos 01/15/2014, 01/11/2015, 01/07/2016, 02/21/2018   Moderna Sars-Covid-2 Vaccination 05/02/2019, 05/30/2019, 02/28/2020, 09/11/2020   Pneumococcal Conjugate-13 08/31/2014   Pneumococcal Polysaccharide-23 08/25/2012   Respiratory Syncytial Virus Vaccine,Recomb Aduvanted(Arexvy) 01/15/2022   Td  01/02/2022   Tdap 09/04/2010   Zoster Recombinant(Shingrix) 04/08/2021, 07/02/2021   Zoster, Live 03/10/2013    Screening Tests Health Maintenance  Topic Date Due   COVID-19 Vaccine (5 - 2024-25 season) 11/29/2022   INFLUENZA VACCINE  10/29/2023   Lung Cancer Screening  12/23/2023   MAMMOGRAM  05/24/2024   Medicare Annual Wellness (AWV)  07/01/2024   DEXA SCAN  06/08/2025   Colonoscopy  02/26/2027   DTaP/Tdap/Td (3 - Td or Tdap) 01/03/2032   Pneumonia Vaccine 102+ Years old  Completed   Hepatitis C Screening  Completed   Zoster Vaccines- Shingrix  Completed   HPV VACCINES  Aged Out    Health Maintenance  Health Maintenance Due  Topic Date Due   COVID-19 Vaccine (5 - 2024-25 season) 11/29/2022    Additional Screening:  Vision Screening: Recommended annual ophthalmology exams for early detection of glaucoma and other disorders of the eye.  Dental Screening: Recommended annual dental exams  for proper oral hygiene  Community Resource Referral / Chronic Care Management: CRR required this visit?  No   CCM required this visit?  No     Plan:     I have personally reviewed and noted the following in the patient's chart:   Medical and social history Use of alcohol, tobacco or illicit drugs  Current medications and supplements including opioid prescriptions. Patient is not currently taking opioid prescriptions. Functional ability and status Nutritional status Physical activity Advanced directives List of other physicians Hospitalizations, surgeries, and ER visits in previous 12 months Vitals Screenings to include cognitive, depression, and falls Referrals and appointments  In addition, I have reviewed and discussed with patient certain preventive protocols, quality metrics, and best practice recommendations. A written personalized care plan for preventive services as well as general preventive health recommendations were provided to patient.     Kandis Fantasia [place], [place]   4/0/9811   After Visit Summary: (MyChart) Due to this being a telephonic visit, the after visit summary with patients personalized plan was offered to patient via MyChart   Notes: Nothing significant to report at this time.

## 2023-07-02 NOTE — Patient Instructions (Signed)
 Diane Carlson , Thank you for taking time to come for your Medicare Wellness Visit. I appreciate your ongoing commitment to your health goals. Please review the following plan we discussed and let me know if I can assist you in the future.   Referrals/Orders/Follow-Ups/Clinician Recommendations: Aim for 30 minutes of exercise or brisk walking, 6-8 glasses of water, and 5 servings of fruits and vegetables each day.  This is a list of the screening recommended for you and due dates:  Health Maintenance  Topic Date Due   COVID-19 Vaccine (5 - 2024-25 season) 11/29/2022   Flu Shot  10/29/2023   Screening for Lung Cancer  12/23/2023   Mammogram  05/24/2024   Medicare Annual Wellness Visit  07/01/2024   DEXA scan (bone density measurement)  06/08/2025   Colon Cancer Screening  02/26/2027   DTaP/Tdap/Td vaccine (3 - Td or Tdap) 01/03/2032   Pneumonia Vaccine  Completed   Hepatitis C Screening  Completed   Zoster (Shingles) Vaccine  Completed   HPV Vaccine  Aged Out    Advanced directives: (ACP Link)Information on Advanced Care Planning can be found at [facility] [place] Advance Health Care Directives Advance Health Care Directives. [email or web address]   Next Medicare Annual Wellness Visit scheduled for next year: Yes

## 2023-07-05 ENCOUNTER — Ambulatory Visit
Admission: RE | Admit: 2023-07-05 | Discharge: 2023-07-05 | Disposition: A | Discharge status: Home / Self Care | Source: Ambulatory Visit | Attending: Nurse Practitioner | Admitting: Nurse Practitioner

## 2023-07-05 DIAGNOSIS — R911 Solitary pulmonary nodule: Secondary | ICD-10-CM

## 2023-07-05 DIAGNOSIS — I2510 Atherosclerotic heart disease of native coronary artery without angina pectoris: Secondary | ICD-10-CM | POA: Diagnosis not present

## 2023-07-05 DIAGNOSIS — R9389 Abnormal findings on diagnostic imaging of other specified body structures: Secondary | ICD-10-CM

## 2023-07-05 DIAGNOSIS — J432 Centrilobular emphysema: Secondary | ICD-10-CM | POA: Diagnosis not present

## 2023-07-06 DIAGNOSIS — H6993 Unspecified Eustachian tube disorder, bilateral: Secondary | ICD-10-CM | POA: Diagnosis not present

## 2023-07-21 DIAGNOSIS — D631 Anemia in chronic kidney disease: Secondary | ICD-10-CM | POA: Diagnosis not present

## 2023-07-21 DIAGNOSIS — N2581 Secondary hyperparathyroidism of renal origin: Secondary | ICD-10-CM | POA: Diagnosis not present

## 2023-07-21 DIAGNOSIS — R809 Proteinuria, unspecified: Secondary | ICD-10-CM | POA: Diagnosis not present

## 2023-07-21 DIAGNOSIS — I129 Hypertensive chronic kidney disease with stage 1 through stage 4 chronic kidney disease, or unspecified chronic kidney disease: Secondary | ICD-10-CM | POA: Diagnosis not present

## 2023-07-21 DIAGNOSIS — N184 Chronic kidney disease, stage 4 (severe): Secondary | ICD-10-CM | POA: Diagnosis not present

## 2023-07-27 DIAGNOSIS — M79675 Pain in left toe(s): Secondary | ICD-10-CM | POA: Diagnosis not present

## 2023-07-27 DIAGNOSIS — L84 Corns and callosities: Secondary | ICD-10-CM | POA: Diagnosis not present

## 2023-07-27 DIAGNOSIS — M79674 Pain in right toe(s): Secondary | ICD-10-CM | POA: Diagnosis not present

## 2023-07-27 DIAGNOSIS — E1142 Type 2 diabetes mellitus with diabetic polyneuropathy: Secondary | ICD-10-CM | POA: Diagnosis not present

## 2023-07-27 DIAGNOSIS — B351 Tinea unguium: Secondary | ICD-10-CM | POA: Diagnosis not present

## 2023-07-30 ENCOUNTER — Encounter: Payer: Self-pay | Admitting: *Deleted

## 2023-08-02 ENCOUNTER — Ambulatory Visit (HOSPITAL_BASED_OUTPATIENT_CLINIC_OR_DEPARTMENT_OTHER): Payer: Medicare HMO | Admitting: Pulmonary Disease

## 2023-08-02 ENCOUNTER — Encounter (HOSPITAL_BASED_OUTPATIENT_CLINIC_OR_DEPARTMENT_OTHER): Payer: Self-pay | Admitting: Pulmonary Disease

## 2023-08-02 VITALS — BP 114/60 | HR 85 | Ht 63.0 in | Wt 96.2 lb

## 2023-08-02 DIAGNOSIS — Z87891 Personal history of nicotine dependence: Secondary | ICD-10-CM | POA: Diagnosis not present

## 2023-08-02 DIAGNOSIS — R9389 Abnormal findings on diagnostic imaging of other specified body structures: Secondary | ICD-10-CM | POA: Diagnosis not present

## 2023-08-02 DIAGNOSIS — J449 Chronic obstructive pulmonary disease, unspecified: Secondary | ICD-10-CM

## 2023-08-02 DIAGNOSIS — R911 Solitary pulmonary nodule: Secondary | ICD-10-CM

## 2023-08-02 NOTE — Progress Notes (Signed)
   Subjective:    Patient ID: Diane Carlson, female    DOB: 1945/05/03, 78 y.o.   MRN: [id number]  HPI  old ex-smoker for follow-up of COPD and pneumonia   PMH-breast cancer status post left lumpectomy, ER positive CKD Hypertension Left CEA She smoked about a pack per day, 40 pack years before she quit in 2011   Initial OV 07/2022 presented with right upper lobe reticulonodular infiltrate Switched her from Trelegy to [facility]   12/2022 phone call >> mild swelling and lip/tongue burning after using Stiolto, switch to Anoro >> she never filled the prescription   4 month FU visit    he is transitioning from Stiolto to Breztri  but has not started Breztri  yet, as she is finishing her current supply of Stiolto. Her breathing is stable, with no significant respiratory issues over the winter. She does not use a rescue inhaler and has not needed any additional inhalers recently.  A recent CT scan showed a persistent hazy area on the right lung, unchanged since previous imaging. She had pneumonia twice in the past, with the second occurrence about a month after the first, and the haziness has persisted since then.  She engages in regular physical activity, including walking, and has had no recent colds or respiratory infections over the winter.     Significant tests/ events reviewed   06/2023 CT chest wo con >> GGO in RUL, stable   09/08/2022 CT chest wo contrast: trace pericardial effusion. Severe atherosclerosis and CAD. Small hia Severe upper lobe predominant centrilobular emphysema. Foal area of interstitial thickening and ground-glass attenuation of RUL   CT chest without contrast 02/2018 BL apical scarring, severe emphysema, subpleural opacity left upper lobe   10/2017 PFTs severe airway obstruction, ratio 47, FEV1 40%, FVC 65%, TLC 169% showing hyperinflation, DLCO 40%  Review of Systems neg for any significant sore throat, dysphagia, itching, sneezing, nasal congestion or excess/  purulent secretions, fever, chills, sweats, unintended wt loss, pleuritic or exertional cp, hempoptysis, orthopnea pnd or change in chronic leg swelling. Also denies presyncope, palpitations, heartburn, abdominal pain, nausea, vomiting, diarrhea or change in bowel or urinary habits, dysuria,hematuria, rash, arthralgias, visual complaints, headache, numbness weakness or ataxia.     Objective:   Physical Exam  Gen. Pleasant, thin woman, in no distress ENT - no thrush, no pallor/icterus,no post nasal drip Neck: No JVD, no thyromegaly, no carotid bruits Lungs: no use of accessory muscles, no dullness to percussion, clear without rales or rhonchi  Cardiovascular: Rhythm regular, heart sounds  normal, no murmurs or gallops, no peripheral edema Musculoskeletal: No deformities, no cyanosis or clubbing        Assessment & Plan:   Chronic Obstructive Pulmonary Disease (COPD) COPD is well-managed with no recent exacerbations or respiratory infections. Transitioning from Stiolto to Breztri , which includes three medications, potentially offering improved management over Stiolto's two.  - Switch from Stiolto to Breztri  after current supply is exhausted. - Monitor symptoms and Breztri  efficacy; consider reverting to Stiolto if ineffective.  - Advise to report any respiratory symptoms or severe colds. - Schedule follow-up appointments every six months.  Abnormal CT chest CT scan reveals stable right-sided haziness, likely scarring from past pneumonia, with less than 5% risk of slow-growing cancer, warranting annual monitoring. - Order follow-up CT scan in one year to monitor right-sided GGO

## 2023-08-02 NOTE — Patient Instructions (Signed)
 X CT chest wo con in April 2026   Switch from Stiolto to Breztri 

## 2023-08-05 DIAGNOSIS — H906 Mixed conductive and sensorineural hearing loss, bilateral: Secondary | ICD-10-CM | POA: Diagnosis not present

## 2023-08-05 DIAGNOSIS — Z9622 Myringotomy tube(s) status: Secondary | ICD-10-CM | POA: Diagnosis not present

## 2023-08-05 DIAGNOSIS — H6993 Unspecified Eustachian tube disorder, bilateral: Secondary | ICD-10-CM | POA: Diagnosis not present

## 2023-08-24 ENCOUNTER — Telehealth: Payer: Self-pay | Admitting: Family Medicine

## 2023-08-24 DIAGNOSIS — N184 Chronic kidney disease, stage 4 (severe): Secondary | ICD-10-CM

## 2023-08-24 NOTE — Telephone Encounter (Signed)
 Copied from CRM [id number]. Topic: Clinical - Medication Refill >> Aug 24, 2023 11:34 AM Danelle Dunning F wrote: Patient called in stating she would like to speak to the nurse that normally processes the prescription below due to her doing in in such a way that the patient doesn't have to pay for the prescription.   Please call patient back with refill information at :   Callback Number: [id number]   Medication: empagliflozin  (JARDIANCE ) 10 MG TABS tablet   Has the patient contacted their pharmacy? No   This is the patient's preferred pharmacy:    [facility] - [place], [place] - [address] [place] [address] Phone: [phone] Fax: [phone]    Is this the correct pharmacy for this prescription? Not sure, call disconnected before agent could confirm ( technical issues)   Has the prescription been filled recently? No  Is the patient out of the medication? Yes  Has the patient been seen for an appointment in the last year OR does the patient have an upcoming appointment? Yes  Can we respond through MyChart? Yes  Agent: Please be advised that Rx refills may take up to 3 business days. We ask that you follow-up with your pharmacy.

## 2023-08-25 NOTE — Telephone Encounter (Signed)
 Pt came in to office requesting to speak directly with Concha Deed, who she says handles her patient assistance for her Jardiance . Pt says she is completely out of her medicine. Made pt aware that Concha Deed is not in the office today but told her I would send Concha Deed a message for her.   Also, talked with nurse Alexandro Angelica to see if we had any samples of Jardiance  10 mg that we could give pt and we were able to give her 2 sample boxes.

## 2023-08-26 ENCOUNTER — Other Ambulatory Visit (HOSPITAL_COMMUNITY): Payer: Self-pay

## 2023-08-30 MED ORDER — EMPAGLIFLOZIN 10 MG PO TABS: 10.0000 mg | ORAL_TABLET | Freq: Every day | ORAL | 3 refills | Status: AC

## 2023-08-30 NOTE — Telephone Encounter (Signed)
 sent

## 2023-08-30 NOTE — Telephone Encounter (Signed)
 The patient called back stating she requested to speak with Concha Deed last week and got some samples of Jardiance  but she needs it. I left Concha Deed a [employer] as well. Please assist patient further

## 2023-10-07 DIAGNOSIS — D492 Neoplasm of unspecified behavior of bone, soft tissue, and skin: Secondary | ICD-10-CM | POA: Diagnosis not present

## 2023-10-07 DIAGNOSIS — H35411 Lattice degeneration of retina, right eye: Secondary | ICD-10-CM | POA: Diagnosis not present

## 2023-10-07 DIAGNOSIS — H04123 Dry eye syndrome of bilateral lacrimal glands: Secondary | ICD-10-CM | POA: Diagnosis not present

## 2023-10-07 DIAGNOSIS — Z961 Presence of intraocular lens: Secondary | ICD-10-CM | POA: Diagnosis not present

## 2023-10-07 DIAGNOSIS — H1045 Other chronic allergic conjunctivitis: Secondary | ICD-10-CM | POA: Diagnosis not present

## 2023-10-07 DIAGNOSIS — H31001 Unspecified chorioretinal scars, right eye: Secondary | ICD-10-CM | POA: Diagnosis not present

## 2023-10-07 DIAGNOSIS — H90A21 Sensorineural hearing loss, unilateral, right ear, with restricted hearing on the contralateral side: Secondary | ICD-10-CM | POA: Diagnosis not present

## 2023-10-07 DIAGNOSIS — H90A32 Mixed conductive and sensorineural hearing loss, unilateral, left ear with restricted hearing on the contralateral side: Secondary | ICD-10-CM | POA: Diagnosis not present

## 2023-10-09 DIAGNOSIS — H10013 Acute follicular conjunctivitis, bilateral: Secondary | ICD-10-CM | POA: Diagnosis not present

## 2023-10-12 DIAGNOSIS — M79674 Pain in right toe(s): Secondary | ICD-10-CM | POA: Diagnosis not present

## 2023-10-12 DIAGNOSIS — E1142 Type 2 diabetes mellitus with diabetic polyneuropathy: Secondary | ICD-10-CM | POA: Diagnosis not present

## 2023-10-12 DIAGNOSIS — M79675 Pain in left toe(s): Secondary | ICD-10-CM | POA: Diagnosis not present

## 2023-10-12 DIAGNOSIS — L84 Corns and callosities: Secondary | ICD-10-CM | POA: Diagnosis not present

## 2023-10-12 DIAGNOSIS — B351 Tinea unguium: Secondary | ICD-10-CM | POA: Diagnosis not present

## 2023-10-16 DIAGNOSIS — H10013 Acute follicular conjunctivitis, bilateral: Secondary | ICD-10-CM | POA: Diagnosis not present

## 2023-11-03 DIAGNOSIS — H0015 Chalazion left lower eyelid: Secondary | ICD-10-CM | POA: Diagnosis not present

## 2023-11-12 ENCOUNTER — Ambulatory Visit: Payer: Self-pay

## 2023-11-12 NOTE — Telephone Encounter (Signed)
  FYI Only or Action Required?: Action required by provider: update on patient condition.  Patient was last seen in primary care on 06/08/2023 by Jolinda Norene HERO, DO.  Called Nurse Triage reporting Hypertension.  Symptoms began several days ago.  Interventions attempted: Prescription medications: norvasc .  Symptoms are: unchanged.  Triage Disposition: See PCP Within 2 Weeks  Patient/caregiver understands and will follow disposition?: No, wishes to speak with PCP    Copied from CRM #[id number]. Topic: Clinical - Red Word Triage >> Nov 12, 2023  3:42 PM Avram MATSU wrote: Red Word that prompted transfer to Nurse Triage: headache with HP 140/90 she is concerned. Reason for Disposition  [1] Systolic BP >= 130 OR Diastolic >= 80 AND [2] taking BP medications  Answer Assessment - Initial Assessment Questions 1. BLOOD PRESSURE: What is your blood pressure? Did you take at least two measurements 5 minutes apart?     140/90  2. ONSET: When did you take your blood pressure?     30 minutes ago but been high the last couple days 3. HOW: How did you take your blood pressure? (e.g., automatic home BP monitor, visiting nurse)     Home  4. HISTORY: Do you have a history of high blood pressure?     yes 5. MEDICINES: Are you taking any medicines for blood pressure? Have you missed any doses recently?     Hasn't missed any does, takes norvasc  6. OTHER SYMPTOMS: Do you have any symptoms? (e.g., blurred vision, chest pain, difficulty breathing, headache, weakness)     headache  Protocols used: Blood Pressure - High-A-AH

## 2023-11-12 NOTE — Telephone Encounter (Signed)
 Pt states that she has been under a lot of stress the last couple week and have noticed that her BP is increased, today the reading was 140/90.  States that is high for her. States she has not missed any medications. Pt wants to know should she take 2 bp pills.  Please advise.

## 2023-11-12 NOTE — Telephone Encounter (Signed)
 Returned patients call and she states it has been running around 140/90 for a few days due to stress.  When asking patient for readings she said she did not keep a log but states it has been around 140/90.  Advised patient to keep a log of her BP for a week and call us  with readings.  Patient voiced understanding and aware we will call her back if provider has any other advise or treatment.

## 2023-11-12 NOTE — Telephone Encounter (Signed)
 Copied from CRM #[id number]. Topic: Clinical - Medical Advice >> Nov 12, 2023  3:20 PM Avram MATSU wrote: Reason for CRM: pt would like to know what to do, she stated her BP has been high recently last time she check it was 140/90. Please call back to advise on what to do.    ----------------------------------------------------------------------- From previous Reason for Contact - Red Word Triage: Red Word that prompted transfer to Nurse Triage: 140/90 >> Nov 12, 2023  3:41 PM Avram MATSU wrote: Just in case the pt can't be reached on her house phone call her cell,  [phone](M) This encounter was created in error - please disregard.

## 2023-11-15 ENCOUNTER — Telehealth: Payer: Self-pay | Admitting: Family Medicine

## 2023-11-15 NOTE — Telephone Encounter (Signed)
 Appt made 8-20 with Dr. KANDICE.

## 2023-11-15 NOTE — Telephone Encounter (Unsigned)
 Copied from CRM [id number]. Topic: Appointments - Appointment Scheduling >> Nov 15, 2023 11:32 AM Myrick T wrote: Patient/patient representative is calling to schedule an appointment. Refer to attachments for appointment information. Patient did not want to see anyone else except her provider but there are no appts until 9/9. Patient said her BP has been running on the high side 137/92 and shes requesting a call back to see if she can be seen sooner

## 2023-11-17 ENCOUNTER — Encounter: Payer: Self-pay | Admitting: Family Medicine

## 2023-11-17 ENCOUNTER — Ambulatory Visit (INDEPENDENT_AMBULATORY_CARE_PROVIDER_SITE_OTHER)

## 2023-11-17 VITALS — BP 138/78 | HR 91 | Temp 97.3°F | Ht 63.0 in | Wt 98.0 lb

## 2023-11-17 DIAGNOSIS — F439 Reaction to severe stress, unspecified: Secondary | ICD-10-CM | POA: Diagnosis not present

## 2023-11-17 DIAGNOSIS — I10 Essential (primary) hypertension: Secondary | ICD-10-CM

## 2023-11-17 MED ORDER — BUSPIRONE HCL 5 MG PO TABS: 5.0000 mg | ORAL_TABLET | Freq: Three times a day (TID) | ORAL | 1 refills | Status: AC | PRN

## 2023-11-17 NOTE — Progress Notes (Signed)
 Subjective: CC:HTN PCP: Jolinda Norene HERO, DO YEP:Diane Carlson is a 78 y.o. female presenting to clinic today for:  1. HTN Reports that she is getting diastolics as high as 90s at home.  She does admit to being under more stress.  There is been some chaos between her grandchildren.  One of her grandchildren, who was recently jailed, got out of jail and had nowhere to go.  She let her and her home and her other grandchildren got really upset about this.  This is caused quite a bit of discord between the family members and she gets anxious about this.  She notes that sometimes this manifests as her heart palpating and she worries that if her blood pressure continues to go up from stress that it is can affect her kidneys more.  She is compliant with all medications.  She is not having any active chest pain, shortness of breath, visual disturbance   ROS: Per HPI  Allergies  Allergen Reactions   Pravastatin     Muscle aches   Zetia [Ezetimibe]     mylagias   Cephalexin Rash    Inside mouth   Sulfa Antibiotics Rash    Including mouth   Past Medical History:  Diagnosis Date   Allergy    Anxiety    Atypical pneumonia 09/25/2017   Cancer (HCC)    breast cancer; dx in 04/2018.    Cholesteatoma    Complication of anesthesia    slow to wake up, couldn't catch my breath- has had anesthesia since this occurence and did fine.    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    Vitamin D  deficiency     Current Outpatient Medications:    albuterol  (VENTOLIN  HFA)  108 (90 Base) MCG/ACT inhaler, Inhale 2 puffs into the lungs every 6 (six) hours as needed for wheezing or shortness of breath., Disp: 8 g, Rfl: 2   amLODipine  (NORVASC ) 10 MG tablet, Take 1 tablet (10 mg total) by mouth daily., Disp: 100 tablet, Rfl: 3   Ascorbic Acid (VITAMIN C) 1000 MG tablet, Take 1,000 mg by mouth daily., Disp: , Rfl:    aspirin  81 MG tablet, Take 81 mg by mouth daily., Disp: , Rfl:    Budeson-Glycopyrrol-Formoterol (BREZTRI  AEROSPHERE) 160-9-4.8 MCG/ACT AERO, Inhale 2 puffs into the lungs in the morning and at bedtime. (Patient not taking: Reported on 08/02/2023), Disp: 32.1 each, Rfl: 5   cholecalciferol (VITAMIN D ) 1000 UNITS tablet, Take 1,000 Units by mouth daily., Disp: , Rfl:    cyanocobalamin (VITAMIN B12) 500 MCG tablet, Take 500 mcg by mouth daily., Disp: , Rfl:    ELDERBERRY PO, Take by mouth., Disp: , Rfl:    empagliflozin  (JARDIANCE ) 10 MG TABS tablet, Take 1 tablet (10 mg total) by mouth daily. Cancel jardiance  25mg  daily prescription, Disp: 90 tablet, Rfl: 3   fluticasone  (FLONASE ) 50 MCG/ACT nasal spray, Place 2 sprays into both nostrils daily., Disp: 16 g, Rfl: 6   Garlic  1000 MG CAPS, Take 1,000 mg by mouth 2 (two) times daily. , Disp: , Rfl:    rosuvastatin  (CRESTOR ) 10 MG tablet, Take 1 tablet (10 mg total) by mouth daily., Disp: 100 tablet, Rfl: 3   Tiotropium Bromide-Olodaterol (STIOLTO RESPIMAT ) 2.5-2.5 MCG/ACT AERS, Inhale 2 puffs into the lungs daily., Disp: , Rfl:    TURMERIC PO, Take by mouth., Disp: , Rfl:    Zinc Sulfate (ZINC 15 PO), Take by mouth., Disp: , Rfl:  Social History   Socioeconomic History   Marital status: Widowed    Spouse name: Not on file   Number of children: 2   Years of education: cosemtology certificate   Highest education level: Some college, no degree  Occupational History   Occupation: Retired    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[employer]  Tobacco Use   Smoking status: Former    Current packs/day: 0.00    Average  packs/day: 1 pack/day for 20.0 years (20.0 ttl pk-yrs)    Types: Cigarettes    Start date: 08/25/1989    Quit date: 08/25/2009    Years since quitting: 14.2   Smokeless tobacco: Never  Vaping Use   Vaping status: Never Used  Substance and Sexual Activity   Alcohol use: No   Drug use: No   Sexual activity: Not Currently    Birth control/protection: Surgical    Comment: Hyst  Other Topics Concern   Not on file  Social History Narrative   Lives alone   She exercises everyday and enjoys zumba   Social Drivers of Health   Financial Resource Strain: Low Risk  (07/02/2023)   Overall Financial Resource Strain (CARDIA)    Difficulty of Paying Living Expenses: Not hard at all  Food Insecurity: No Food Insecurity (07/02/2023)   Hunger Vital Sign    Worried About Running Out of Food in the Last Year: Never true    Ran Out of Food in the Last Year: Never true  Transportation Needs: No Transportation Needs (07/02/2023)   PRAPARE - Administrator, Civil Service (Medical): No    Lack of Transportation (Non-Medical): No  Physical Activity: Sufficiently Active (07/02/2023)   Exercise Vital Sign    Days of Exercise per Week: 5 days    Minutes of Exercise per Session: 60 min  Stress: No Stress Concern Present (07/02/2023)   [employer] of Occupational Health - Occupational Stress Questionnaire    Feeling of Stress : Not at all  Social Connections: Moderately Integrated (07/02/2023)   Social Connection and Isolation Panel    Frequency of Communication with Friends and Family: More than three times a week    Frequency of Social Gatherings with Friends and Family: Three times a week    Attends Religious Services: More than 4 times per year    Active Member of Clubs or Organizations: Yes    Attends Banker Meetings: More than 4 times per year    Marital Status: Widowed  Intimate Partner Violence: Not At Risk (07/02/2023)   Humiliation, Afraid, Rape, and Kick questionnaire     Fear of Current or Ex-Partner: No    Emotionally Abused: No    Physically Abused: No    Sexually Abused: No   Family History  Problem Relation Age of Onset   Heart disease Mother    Stroke Mother 52   Heart disease Father    Heart attack Father    Hypertension Son    Heart attack Paternal Aunt    Heart  disease Paternal Aunt     Objective: Office vital signs reviewed. BP 138/78   Pulse 91   Temp (!) 97.3 F (36.3 C)   Ht 5' 3 (1.6 m)   Wt 98 lb (44.5 kg)   SpO2 96%   BMI 17.36 kg/m   Physical Examination:  General: Awake, alert, well appearing elderly female, No acute distress HEENT: sclera white, MMM Cardio: regular rate and rhythm, S1S2 heard, no murmurs appreciated Pulm: clear to auscultation bilaterally, no wheezes, rhonchi or rales; normal work of breathing on room air Psych: Appears anxious when telling me about this family discord     11/17/2023    8:28 AM 07/02/2023    3:51 PM 06/08/2023   10:24 AM  Depression screen PHQ 2/9  Decreased Interest 0 0 0  Down, Depressed, Hopeless 0 0 0  PHQ - 2 Score 0 0 0  Altered sleeping 1 0 0  Tired, decreased energy 0 0 0  Change in appetite 0 0 0  Feeling bad or failure about yourself  0 0 0  Trouble concentrating 0 0 0  Moving slowly or fidgety/restless 0 0 0  Suicidal thoughts 0 0 0  PHQ-9 Score 1 0 0  Difficult doing work/chores Not difficult at all Not difficult at all Not difficult at all      11/17/2023    8:28 AM 06/08/2023   10:24 AM 01/04/2023    3:43 PM 07/06/2022    8:01 AM  GAD 7 : Generalized Anxiety Score  Nervous, Anxious, on Edge 0 0 0 0  Control/stop worrying 0 0 0 0  Worry too much - different things 1 0 0 0  Trouble relaxing 0 0 0 0  Restless 0 0 0 0  Easily annoyed or irritable 0 0 0 0  Afraid - awful might happen 0 0 0 0  Total GAD 7 Score 1 0 0 0  Anxiety Difficulty Not difficult at all Not difficult at all          Assessment/ Plan: 78 y.o. female   Essential hypertension,  benign  Stress - Plan: busPIRone  (BUSPAR ) 5 MG tablet  Blood pressure controlled.  I suspect she is having transient elevations due to acute stress reaction.  Discussed considering therapy here in office versus low-dose medication.  She was more interested in starting a little medication and I will put her on very low-dose BuSpar  given renal disease and we can titrate up as needed.  She has follow-up with me in September, encouraged her to contact me sooner if needs arise   Norene CHRISTELLA Fielding, DO [facility] [phone]

## 2023-11-17 NOTE — Patient Instructions (Signed)

## 2023-12-08 ENCOUNTER — Other Ambulatory Visit: Payer: Self-pay

## 2023-12-08 ENCOUNTER — Other Ambulatory Visit

## 2023-12-08 DIAGNOSIS — E782 Mixed hyperlipidemia: Secondary | ICD-10-CM

## 2023-12-08 DIAGNOSIS — N184 Chronic kidney disease, stage 4 (severe): Secondary | ICD-10-CM

## 2023-12-08 DIAGNOSIS — I10 Essential (primary) hypertension: Secondary | ICD-10-CM

## 2023-12-10 ENCOUNTER — Ambulatory Visit: Admitting: Family Medicine

## 2023-12-10 ENCOUNTER — Telehealth: Payer: Self-pay

## 2023-12-10 NOTE — Telephone Encounter (Signed)
 Copied from CRM [id number]. Topic: General - Other >> Dec 10, 2023  2:07 PM Willma R wrote: Reason for CRM: Patient is requesting to speak with the pharmacist Mliss. Would like a call back.  Rupa can be reached at [phone] or [phone]

## 2023-12-13 ENCOUNTER — Telehealth: Payer: Self-pay

## 2023-12-13 NOTE — Telephone Encounter (Signed)
 Copied from CRM #[id number]. Topic: Clinical - Medication Question >> Dec 13, 2023  8:06 AM Diannia H wrote: Reason for CRM: Patient is requesting to speak with the pharmacist Mliss. Would like a call back.   Zanaya can be reached at [phone] or [phone]

## 2023-12-14 ENCOUNTER — Telehealth: Payer: Self-pay

## 2023-12-14 ENCOUNTER — Other Ambulatory Visit

## 2023-12-14 DIAGNOSIS — I10 Essential (primary) hypertension: Secondary | ICD-10-CM

## 2023-12-14 DIAGNOSIS — E782 Mixed hyperlipidemia: Secondary | ICD-10-CM | POA: Diagnosis not present

## 2023-12-14 LAB — LIPID PANEL

## 2023-12-14 NOTE — Telephone Encounter (Signed)
 Patient came into clinic today asking for Mliss Griffin, stating that she was nearly out of her Jardiance  prescription. She reports that her next shipment of medication is late, and she is concerned about running out of her prescription.   She is on patient assistance for Jardiance . Ensured her that her prescription will be arriving in the mail. Provided the patient with two 30 tablet samples of Jardiance  10 mg.   Woodie Jock, PharmD PGY1 Pharmacy Resident  12/14/2023

## 2023-12-15 ENCOUNTER — Telehealth: Payer: Self-pay | Admitting: Pharmacist

## 2023-12-15 ENCOUNTER — Ambulatory Visit: Payer: Self-pay | Admitting: Family Medicine

## 2023-12-15 LAB — CMP14+EGFR
ALT: 11 IU/L (ref 0–32)
AST: 20 IU/L (ref 0–40)
Albumin: 4.6 g/dL (ref 3.8–4.8)
Alkaline Phosphatase: 176 IU/L — AB (ref 49–135)
BUN/Creatinine Ratio: 12 (ref 12–28)
BUN: 34 mg/dL — ABNORMAL HIGH (ref 8–27)
Bilirubin Total: 0.2 mg/dL (ref 0.0–1.2)
CO2: 19 mmol/L — ABNORMAL LOW (ref 20–29)
Calcium: 10 mg/dL (ref 8.7–10.3)
Chloride: 106 mmol/L (ref 96–106)
Creatinine, Ser: 2.81 mg/dL — AB (ref 0.57–1.00)
Globulin, Total: 2.7 g/dL (ref 1.5–4.5)
Glucose: 90 mg/dL (ref 70–99)
Potassium: 4.7 mmol/L (ref 3.5–5.2)
Sodium: 142 mmol/L (ref 134–144)
Total Protein: 7.3 g/dL (ref 6.0–8.5)
eGFR: 17 mL/min/1.73 — AB (ref >59)

## 2023-12-15 LAB — CBC WITH DIFFERENTIAL/PLATELET
Basophils Absolute: 0 x10E3/uL (ref 0.0–0.2)
Basos: 0 %
EOS (ABSOLUTE): 0.2 x10E3/uL (ref 0.0–0.4)
Eos: 2 %
Hematocrit: 36.3 % (ref 34.0–46.6)
Hemoglobin: 11.7 g/dL (ref 11.1–15.9)
Immature Grans (Abs): 0.1 x10E3/uL (ref 0.0–0.1)
Immature Granulocytes: 1 %
Lymphocytes Absolute: 1.7 x10E3/uL (ref 0.7–3.1)
Lymphs: 18 %
MCH: 30.1 pg (ref 26.6–33.0)
MCHC: 32.2 g/dL (ref 31.5–35.7)
MCV: 93 fL (ref 79–97)
Monocytes Absolute: 0.7 x10E3/uL (ref 0.1–0.9)
Monocytes: 7 %
Neutrophils Absolute: 6.7 x10E3/uL (ref 1.4–7.0)
Neutrophils: 72 %
Platelets: 255 x10E3/uL (ref 150–450)
RBC: 3.89 x10E6/uL (ref 3.77–5.28)
RDW: 13.2 % (ref 11.7–15.4)
WBC: 9.3 x10E3/uL (ref 3.4–10.8)

## 2023-12-15 LAB — LIPID PANEL
Cholesterol, Total: 139 mg/dL (ref 100–199)
HDL: 74 mg/dL (ref >39)
LDL CALC COMMENT:: 1.9 ratio (ref 0.0–4.4)
LDL Chol Calc (NIH): 50 mg/dL (ref 0–99)
Triglycerides: 76 mg/dL (ref 0–149)
VLDL Cholesterol Cal: 15 mg/dL (ref 5–40)

## 2023-12-15 NOTE — Telephone Encounter (Signed)
 Samples given to patient on 12/14/23 for jardiance  10mg 

## 2023-12-15 NOTE — Telephone Encounter (Signed)
   Patient enrolled previously enrolled with [facility] for Jardiance  10mg  daily (unsure of enrollment year).  She showed today in clinic asking for samples.   14 day supply of Jardiacne 10mg  daily given to patient.  Will route encounter to [place], CPhT, to ensure re-enrollment and PAP is in good standing.  Appreciate assistance.  Will need to re-enroll and updated active FYIs  Mliss Tarry Griffin, PharmD, BCACP, CPP Clinical Pharmacist, [facility]

## 2023-12-16 ENCOUNTER — Encounter: Payer: Self-pay | Admitting: Pharmacist

## 2023-12-17 ENCOUNTER — Ambulatory Visit: Admitting: Family Medicine

## 2023-12-17 ENCOUNTER — Encounter: Payer: Self-pay | Admitting: Family Medicine

## 2023-12-17 VITALS — BP 127/70 | HR 88 | Temp 97.4°F | Ht 64.0 in | Wt 100.1 lb

## 2023-12-17 DIAGNOSIS — E782 Mixed hyperlipidemia: Secondary | ICD-10-CM | POA: Diagnosis not present

## 2023-12-17 DIAGNOSIS — I10 Essential (primary) hypertension: Secondary | ICD-10-CM

## 2023-12-17 DIAGNOSIS — N184 Chronic kidney disease, stage 4 (severe): Secondary | ICD-10-CM

## 2023-12-17 DIAGNOSIS — F439 Reaction to severe stress, unspecified: Secondary | ICD-10-CM | POA: Diagnosis not present

## 2023-12-17 DIAGNOSIS — J449 Chronic obstructive pulmonary disease, unspecified: Secondary | ICD-10-CM | POA: Diagnosis not present

## 2023-12-17 NOTE — Progress Notes (Signed)
 Subjective: CC: 6 months checkup PCP: Jolinda Norene HERO, DO YEP:Diane Carlson is a 78 y.o. female presenting to clinic today for: History of Present Illness  Hypertension with hyperlipidemia and CKD 4 Patient is compliant with all medications as prescribed.  Reports no chest pain, shortness of breath, edema or change in urine output.  Stress Patient reports that stress has gotten better since her last visit and she uses BuSpar  as needed.  Things seem to have settled down within the family.  COPD Patient reports intermittent compliance with Breztri .  No hemoptysis, change in activity level or tolerance   ROS: Per HPI  Allergies  Allergen Reactions   Pravastatin     Muscle aches   Zetia [Ezetimibe]     mylagias   Cephalexin Rash    Inside mouth   Sulfa Antibiotics Rash    Including mouth   Past Medical History:  Diagnosis Date   Allergy    Anxiety    Atypical pneumonia 09/25/2017   Cancer (HCC)    breast cancer; dx in 04/2018.    Cholesteatoma    Complication of anesthesia    slow to wake up, couldn't catch my breath- has had anesthesia since this occurence and did fine.    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    Vitamin D  deficiency     Current Outpatient Medications:    albuterol  (VENTOLIN  HFA) 108 (90 Base) MCG/ACT inhaler, Inhale 2 puffs into the lungs every 6 (six) hours as needed for wheezing or shortness of breath., Disp: 8 g, Rfl: 2   amLODipine  (NORVASC ) 10 MG tablet, Take 1 tablet (10 mg total) by mouth daily., Disp: 100  tablet, Rfl: 3   Ascorbic Acid (VITAMIN C) 1000 MG tablet, Take 1,000 mg by mouth daily., Disp: , Rfl:    aspirin  81 MG tablet, Take 81 mg by mouth daily., Disp: , Rfl:    Budeson-Glycopyrrol-Formoterol (BREZTRI  AEROSPHERE) 160-9-4.8 MCG/ACT AERO, Inhale 2 puffs into the lungs in the morning and at bedtime., Disp: 32.1 each, Rfl: 5   busPIRone  (BUSPAR ) 5 MG tablet, Take 1 tablet (5 mg total) by mouth 3 (three) times daily as needed (anxiety)., Disp: 90 tablet, Rfl: 1   cholecalciferol (VITAMIN D ) 1000 UNITS tablet, Take 1,000 Units by mouth daily., Disp: , Rfl:    cyanocobalamin (VITAMIN B12) 500 MCG tablet, Take 500 mcg by mouth daily., Disp: , Rfl:    ELDERBERRY PO, Take by mouth., Disp: , Rfl:    empagliflozin  (JARDIANCE ) 10 MG TABS tablet, Take 1 tablet (10 mg total) by mouth daily. Cancel jardiance  25mg  daily prescription, Disp: 90 tablet, Rfl: 3   fluticasone  (FLONASE ) 50 MCG/ACT nasal spray, Place 2 sprays into both nostrils daily., Disp: 16 g, Rfl: 6   Garlic 1000 MG CAPS, Take 1,000 mg by mouth 2 (two) times daily. , Disp: , Rfl:    rosuvastatin  (CRESTOR ) 10 MG tablet, Take 1 tablet (10 mg total) by mouth daily., Disp: 100 tablet, Rfl:  3   Zinc Sulfate (ZINC 15 PO), Take by mouth., Disp: , Rfl:    Tiotropium Bromide-Olodaterol (STIOLTO RESPIMAT ) 2.5-2.5 MCG/ACT AERS, Inhale 2 puffs into the lungs daily. (Patient not taking: Reported on 12/17/2023), Disp: , Rfl:    TURMERIC PO, Take by mouth. 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[employer]  Tobacco Use   Smoking status: Former    Current packs/day: 0.00    Average packs/day: 1 pack/day for 20.0 years (20.0 ttl pk-yrs)     Types: Cigarettes    Start date: 08/25/1989    Quit date: 08/25/2009    Years since quitting: 14.3   Smokeless tobacco: Never  Vaping Use   Vaping status: Never Used  Substance and Sexual Activity   Alcohol use: No   Drug use: No   Sexual activity: Not Currently    Birth control/protection: Surgical    Comment: Hyst  Other Topics Concern   Not on file  Social History Narrative   Lives alone   She exercises everyday and enjoys zumba   Social Drivers of Health   Financial Resource Strain: Low Risk  (07/02/2023)   Overall Financial Resource Strain (CARDIA)    Difficulty of Paying Living Expenses: Not hard at all  Food Insecurity: No Food Insecurity (07/02/2023)   Hunger Vital Sign    Worried About Running Out of Food in the Last Year: Never true    Ran Out of Food in the Last Year: Never true  Transportation Needs: No Transportation Needs (07/02/2023)   PRAPARE - Administrator, Civil Service (Medical): No    Lack of Transportation (Non-Medical): No  Physical Activity: Sufficiently Active (07/02/2023)   Exercise Vital Sign    Days of Exercise per Week: 5 days    Minutes of Exercise per Session: 60 min  Stress: No Stress Concern Present (07/02/2023)   [employer] of Occupational Health - Occupational Stress Questionnaire    Feeling of Stress : Not at all  Social Connections: Moderately Integrated (07/02/2023)   Social Connection and Isolation Panel    Frequency of Communication with Friends and Family: More than three times a week    Frequency of Social Gatherings with Friends and Family: Three times a week    Attends Religious Services: More than 4 times per year    Active Member of Clubs or Organizations: Yes    Attends Banker Meetings: More than 4 times per year    Marital Status: Widowed  Intimate Partner Violence: Not At Risk (07/02/2023)   Humiliation, Afraid, Rape, and Kick questionnaire    Fear of Current or Ex-Partner: No    Emotionally Abused: No     Physically Abused: No    Sexually Abused: No   Family History  Problem Relation Age of Onset   Heart disease Mother    Stroke Mother 71   Heart disease Father    Heart attack Father    Hypertension Son    Heart attack Paternal Aunt    Heart disease Paternal Aunt     Objective: Office vital signs reviewed. BP 127/70   Pulse 88   Temp (!) 97.4 F (36.3  C)   Ht 5' 4 (1.626 m)   Wt 100 lb 2 oz (45.4 kg)   SpO2 97%   BMI 17.19 kg/m   Physical Examination:  General: Awake, alert, thin, petite elderly female, No acute distress HEENT: Sclera white.  Moist mucous membranes Cardio: regular rate and rhythm, S1S2 heard, no murmurs appreciated Pulm: Globally decreased breath sounds with normal work of breathing on room air.  No appreciable wheezes, rhonchi or rails.  No coughing Extremities: No edema      12/17/2023   10:48 AM 11/17/2023    8:28 AM 07/02/2023    3:51 PM  Depression screen PHQ 2/9  Decreased Interest 0 0 0  Down, Depressed, Hopeless 0 0 0  PHQ - 2 Score 0 0 0  Altered sleeping 0 1 0  Tired, decreased energy 0 0 0  Change in appetite 0 0 0  Feeling bad or failure about yourself  0 0 0  Trouble concentrating 0 0 0  Moving slowly or fidgety/restless 0 0 0  Suicidal thoughts 0 0 0  PHQ-9 Score 0 1 0  Difficult doing work/chores Not difficult at all Not difficult at all Not difficult at all      12/17/2023   10:48 AM 11/17/2023    8:28 AM 06/08/2023   10:24 AM 01/04/2023    3:43 PM  GAD 7 : Generalized Anxiety Score  Nervous, Anxious, on Edge 0 0 0 0  Control/stop worrying 0 0 0 0  Worry too much - different things 0 1 0 0  Trouble relaxing 0 0 0 0  Restless 0 0 0 0  Easily annoyed or irritable 0 0 0 0  Afraid - awful might happen 0 0 0 0  Total GAD 7 Score 0 1 0 0  Anxiety Difficulty Not difficult at all Not difficult at all Not difficult at all     Recent Results (from the past 2160 hours)  Lipid panel     Status: None   Collection Time: 12/14/23   8:34 AM  Result Value Ref Range   Cholesterol, Total 139 100 - 199 mg/dL   Triglycerides 76 0 - 149 mg/dL   HDL 74 >60 mg/dL   VLDL Cholesterol Cal 15 5 - 40 mg/dL   LDL Chol Calc (NIH) 50 0 - 99 mg/dL   Chol/HDL Ratio 1.9 0.0 - 4.4 ratio    Comment:                                   T. Chol/HDL Ratio                                             Men  Women                               1/2 Avg.Risk  3.4    3.3                                   Avg.Risk  5.0    4.4  2X Avg.Risk  9.6    7.1                                3X Avg.Risk 23.4   11.0   CMP14+EGFR     Status: Abnormal   Collection Time: 12/14/23  8:34 AM  Result Value Ref Range   Glucose 90 70 - 99 mg/dL   BUN 34 (H) 8 - 27 mg/dL   Creatinine, Ser 7.18 (H) 0.57 - 1.00 mg/dL   eGFR 17 (L) >40 fO/fpw/8.26   BUN/Creatinine Ratio 12 12 - 28   Sodium 142 134 - 144 mmol/L   Potassium 4.7 3.5 - 5.2 mmol/L   Chloride 106 96 - 106 mmol/L   CO2 19 (L) 20 - 29 mmol/L   Calcium  10.0 8.7 - 10.3 mg/dL   Total Protein 7.3 6.0 - 8.5 g/dL   Albumin 4.6 3.8 - 4.8 g/dL   Globulin, Total 2.7 1.5 - 4.5 g/dL   Bilirubin Total 0.2 0.0 - 1.2 mg/dL   Alkaline Phosphatase 176 (H) 49 - 135 IU/L    Comment:               **Please note reference interval change**   AST 20 0 - 40 IU/L   ALT 11 0 - 32 IU/L  CBC with Differential/Platelet     Status: None   Collection Time: 12/14/23  8:34 AM  Result Value Ref Range   WBC 9.3 3.4 - 10.8 x10E3/uL   RBC 3.89 3.77 - 5.28 x10E6/uL   Hemoglobin 11.7 11.1 - 15.9 g/dL   Hematocrit 63.6 65.9 - 46.6 %   MCV 93 79 - 97 fL   MCH 30.1 26.6 - 33.0 pg   MCHC 32.2 31.5 - 35.7 g/dL   RDW 86.7 88.2 - 84.5 %   Platelets 255 150 - 450 x10E3/uL   Neutrophils 72 Not Estab. %   Lymphs 18 Not Estab. %   Monocytes 7 Not Estab. %   Eos 2 Not Estab. %   Basos 0 Not Estab. %   Neutrophils Absolute 6.7 1.4 - 7.0 x10E3/uL   Lymphocytes Absolute 1.7 0.7 - 3.1 x10E3/uL   Monocytes  Absolute 0.7 0.1 - 0.9 x10E3/uL   EOS (ABSOLUTE) 0.2 0.0 - 0.4 x10E3/uL   Basophils Absolute 0.0 0.0 - 0.2 x10E3/uL   Immature Granulocytes 1 Not Estab. %   Immature Grans (Abs) 0.1 0.0 - 0.1 x10E3/uL   Assessment/ Plan: 78 y.o. female   Assessment & Plan   Stress  CKD (chronic kidney disease) stage 4, GFR 15-29 ml/min (HCC) - Plan: CMP14+EGFR, CBC, VITAMIN D  25 Hydroxy (Vit-D Deficiency, Fractures)  Essential hypertension, benign - Plan: CMP14+EGFR  Mixed hyperlipidemia - Plan: CMP14+EGFR, Lipid panel, TSH  COPD, severe (HCC)  Stressed under better control with situational changes and as needed BuSpar   Reviewed labs today.  Renal dysfunction is stable.  Continue follow-up with nephrology as directed  Blood pressure controlled.  No changes needed.  Cholesterol stable.  No changes  Encouraged use of Breztri  as she had globally decreased breath sounds on exam today  She wants to hold off on influenza vaccination until October  Diane Masella M Marx Doig, DO [facility] [phone]

## 2023-12-21 NOTE — Telephone Encounter (Signed)
 Patient following up on Jardiance  shipment.   Gave [facility] phone number for patient to call and follow up on her shipment.

## 2023-12-28 ENCOUNTER — Ambulatory Visit: Attending: Internal Medicine | Admitting: Internal Medicine

## 2023-12-28 ENCOUNTER — Encounter: Payer: Self-pay | Admitting: Internal Medicine

## 2023-12-28 VITALS — BP 132/68 | HR 84 | Ht 64.0 in | Wt 101.0 lb

## 2023-12-28 DIAGNOSIS — I779 Disorder of arteries and arterioles, unspecified: Secondary | ICD-10-CM | POA: Diagnosis not present

## 2023-12-28 DIAGNOSIS — I471 Supraventricular tachycardia, unspecified: Secondary | ICD-10-CM | POA: Diagnosis not present

## 2023-12-28 DIAGNOSIS — N184 Chronic kidney disease, stage 4 (severe): Secondary | ICD-10-CM

## 2023-12-28 DIAGNOSIS — I739 Peripheral vascular disease, unspecified: Secondary | ICD-10-CM

## 2023-12-28 DIAGNOSIS — Z9889 Other specified postprocedural states: Secondary | ICD-10-CM | POA: Diagnosis not present

## 2023-12-28 DIAGNOSIS — J449 Chronic obstructive pulmonary disease, unspecified: Secondary | ICD-10-CM | POA: Diagnosis not present

## 2023-12-28 DIAGNOSIS — I10 Essential (primary) hypertension: Secondary | ICD-10-CM

## 2023-12-28 DIAGNOSIS — I2510 Atherosclerotic heart disease of native coronary artery without angina pectoris: Secondary | ICD-10-CM

## 2023-12-28 NOTE — Patient Instructions (Signed)
 Medication Instructions:  Your physician recommends that you continue on your current medications as directed. Please refer to the Current Medication list given to you today.  *If you need a refill on your cardiac medications before your next appointment, please call your pharmacy*  Lab Work: None ordered If you have labs (blood work) drawn today and your tests are completely normal, you will receive your results only by: MyChart Message (if you have MyChart) OR A paper copy in the mail If you have any lab test that is abnormal or we need to change your treatment, we will call you to review the results.  Follow-Up: At [facility], you and your health needs are our priority.  As part of our continuing mission to provide you with exceptional heart care, our providers are all part of one team.  This team includes your primary Cardiologist (physician) and Advanced Practice Providers or APPs (Physician Assistants and Nurse Practitioners) who all work together to provide you with the care you need, when you need it.  Your next appointment:   As needed

## 2023-12-28 NOTE — Progress Notes (Signed)
  Cardiology Office Note:  .   Date:  12/28/2023  ID:  Diane Carlson, DOB 15-Jul-1945, MRN [id number] PCP: Diane Norene HERO, DO  [facility] Providers Cardiologist:  None PV Cardiologist:  Diane Cage, MD    History of Present Illness: Diane Carlson   Diane Carlson is a 78 y.o. female.  Discussed the use of AI scribe software for clinical note transcription with the patient, who gave verbal consent to proceed.  History of Present Illness Diane Carlson is a 78 year old female with coronary artery disease and peripheral arterial disease who presents for cardiovascular follow-up.  She experiences occasional chest pain, which she believes is related to gastrointestinal issues as it improves with passing gas or bowel movements. There are no palpitations or racing heartbeats currently, which were present a year ago.  She is concerned about bruising on her legs and arms and questions the necessity of daily aspirin  use. She has a history of a carotid disease that was treated and has undergone a carotid endarterectomy. Secondary prevention indicated.  Her peripheral arterial disease causes occasional left leg pain, though less severe than before, and walking alleviates the symptoms. Walking program helped.    ROS: negative except per HPI above.  Studies Reviewed: .        Results LABS Creatinine: 2.8 (12/21/2023)  RADIOLOGY CT chest: Significant coronary calcification  DIAGNOSTIC Echocardiogram: Normal Risk Assessment/Calculations:       Physical Exam:   VS:  BP 132/68 (BP Location: Left Arm, Patient Position: Sitting)   Pulse 84   Ht 5' 4 (1.626 m)   Wt 101 lb (45.8 kg)   SpO2 97%   BMI 17.34 kg/m    Wt Readings from Last 3 Encounters:  12/28/23 101 lb (45.8 kg)  12/17/23 100 lb 2 oz (45.4 kg)  11/17/23 98 lb (44.5 kg)     Physical Exam MEASUREMENTS: Weight- 101. GENERAL: Alert, cooperative, well developed, no acute distress. HEENT: Normocephalic,  normal oropharynx, moist mucous membranes. CHEST: Clear to auscultation bilaterally, no wheezes, rhonchi, or crackles. CARDIOVASCULAR: Normal heart rate and rhythm, S1 and S2 normal without murmurs. ABDOMEN: Soft, non-tender, non-distended, without organomegaly, normal bowel sounds. EXTREMITIES: No cyanosis or edema. NEUROLOGICAL: Cranial nerves grossly intact, moves all extremities without gross motor or sensory deficit. SKIN: Bruising present on arms and legs.   ASSESSMENT AND PLAN: .    Assessment and Plan Assessment & Plan Atherosclerotic cardiovascular disease with history of carotid endarterectomy Significant coronary calcium  and prior carotid endarterectomy. Aspirin  therapy recommended for stroke risk reduction despite bruising risk. - Continue aspirin  81 mg daily therapy for secondary prevention.  Peripheral arterial disease Intermittent leg pain improves with walking, indicating well-managed condition.  Chronic kidney disease Stable kidney function with creatinine at 2.8, consistent with previous results. - Regular follow-up with nephrologist every six months.  Chronic obstructive pulmonary disease COPD with Breztri  inhaler.    Follow up PRN.    Soyla Merck, MD, FACC

## 2024-01-18 ENCOUNTER — Ambulatory Visit (INDEPENDENT_AMBULATORY_CARE_PROVIDER_SITE_OTHER)

## 2024-01-18 ENCOUNTER — Other Ambulatory Visit

## 2024-01-18 DIAGNOSIS — Z23 Encounter for immunization: Secondary | ICD-10-CM

## 2024-01-25 DIAGNOSIS — I129 Hypertensive chronic kidney disease with stage 1 through stage 4 chronic kidney disease, or unspecified chronic kidney disease: Secondary | ICD-10-CM | POA: Diagnosis not present

## 2024-01-25 DIAGNOSIS — N184 Chronic kidney disease, stage 4 (severe): Secondary | ICD-10-CM | POA: Diagnosis not present

## 2024-01-25 DIAGNOSIS — D631 Anemia in chronic kidney disease: Secondary | ICD-10-CM | POA: Diagnosis not present

## 2024-01-26 ENCOUNTER — Ambulatory Visit

## 2024-01-28 ENCOUNTER — Telehealth: Payer: Self-pay

## 2024-01-28 NOTE — Telephone Encounter (Unsigned)
 Copied from CRM #[id number]. Topic: General - Other >> Jan 28, 2024 10:49 AM Travis F wrote: Reason for CRM: Patient is calling in requesting to speak with Mliss Griffin regarding a letter she received. Please follow up with patient.

## 2024-01-31 ENCOUNTER — Ambulatory Visit: Admitting: Internal Medicine

## 2024-02-01 NOTE — Telephone Encounter (Signed)
 Patient unsure which application/letter she got in the mail, but she dropped it off at the office today for [place].   Informed patient I would mail both applications ([employer]- Breztri  and [employer]- Jardiance ) applications to her home for her to complete and send back for re-enrollment.  Mliss, if its her conditional approval letter with [employer]me please email/fax to me, I'll document it for her. If its any provider pages, go ahead and get those signed if possible and I'll save those until I get her pages returned! Thanks!

## 2024-02-02 DIAGNOSIS — N2581 Secondary hyperparathyroidism of renal origin: Secondary | ICD-10-CM | POA: Diagnosis not present

## 2024-02-02 DIAGNOSIS — I700 Atherosclerosis of aorta: Secondary | ICD-10-CM | POA: Diagnosis not present

## 2024-02-02 DIAGNOSIS — J439 Emphysema, unspecified: Secondary | ICD-10-CM | POA: Diagnosis not present

## 2024-02-02 DIAGNOSIS — I2510 Atherosclerotic heart disease of native coronary artery without angina pectoris: Secondary | ICD-10-CM | POA: Diagnosis not present

## 2024-02-02 DIAGNOSIS — I70209 Unspecified atherosclerosis of native arteries of extremities, unspecified extremity: Secondary | ICD-10-CM | POA: Diagnosis not present

## 2024-02-02 DIAGNOSIS — I10 Essential (primary) hypertension: Secondary | ICD-10-CM | POA: Diagnosis not present

## 2024-02-02 DIAGNOSIS — I471 Supraventricular tachycardia, unspecified: Secondary | ICD-10-CM | POA: Diagnosis not present

## 2024-02-02 DIAGNOSIS — Z681 Body mass index (BMI) 19 or less, adult: Secondary | ICD-10-CM | POA: Diagnosis not present

## 2024-02-02 DIAGNOSIS — M109 Gout, unspecified: Secondary | ICD-10-CM | POA: Diagnosis not present

## 2024-02-02 DIAGNOSIS — E785 Hyperlipidemia, unspecified: Secondary | ICD-10-CM | POA: Diagnosis not present

## 2024-02-02 DIAGNOSIS — R636 Underweight: Secondary | ICD-10-CM | POA: Diagnosis not present

## 2024-02-02 DIAGNOSIS — K219 Gastro-esophageal reflux disease without esophagitis: Secondary | ICD-10-CM | POA: Diagnosis not present

## 2024-02-02 DIAGNOSIS — M8580 Other specified disorders of bone density and structure, unspecified site: Secondary | ICD-10-CM | POA: Diagnosis not present

## 2024-02-02 DIAGNOSIS — Z8249 Family history of ischemic heart disease and other diseases of the circulatory system: Secondary | ICD-10-CM | POA: Diagnosis not present

## 2024-02-02 DIAGNOSIS — Z853 Personal history of malignant neoplasm of breast: Secondary | ICD-10-CM | POA: Diagnosis not present

## 2024-02-08 ENCOUNTER — Telehealth: Payer: Self-pay | Admitting: Family Medicine

## 2024-02-08 NOTE — Telephone Encounter (Signed)
 Left message making patient aware that Diane Carlson received paperwork and faxed over to [employer] for completion and to send in for patient. Explained to patient that if there are any issues, they will reach out to patient.

## 2024-02-08 NOTE — Telephone Encounter (Signed)
Advised patient of the message.

## 2024-02-08 NOTE — Telephone Encounter (Signed)
 Pt says that she dropped off paperwork for Mliss and wants to know if it is the correct paperwork. She says it is for her medications. Please call back

## 2024-02-09 ENCOUNTER — Telehealth: Payer: Self-pay

## 2024-02-09 NOTE — Telephone Encounter (Signed)
 Rec'd re-enrollment paperwork for patients Breztri .   In process of faxing to [employer].

## 2024-02-11 ENCOUNTER — Telehealth: Payer: Self-pay | Admitting: Family Medicine

## 2024-02-11 ENCOUNTER — Telehealth: Payer: Self-pay

## 2024-02-11 NOTE — Telephone Encounter (Signed)
 Informed patient her re-enrollment application was rec'd and faxed to company today.

## 2024-02-11 NOTE — Telephone Encounter (Signed)
 Please review and advise.

## 2024-02-11 NOTE — Telephone Encounter (Signed)
 Application mailed to patient 02/01/24  Spoke with patient, says this was completed and placed back in the mail to me today.

## 2024-02-11 NOTE — Telephone Encounter (Signed)
 Copied from CRM [id number]. Topic: General - Other >> Jan 31, 2024  8:46 AM Diannia H wrote: Reason for CRM: Patient is calling in requesting to speak with Mliss Griffin regarding a letter she received. Please follow up with patient.  Informed the patient that a message was sent and she should be receiving a callback by the end of business day today >> Feb 11, 2024 11:30 AM Antwanette L wrote: The patient is calling to inform Mliss Griffin that she received a phone call from [facility]) stating they did not receive her paperwork for empagliflozin  (Jardiance ) 10 mg tablets and Budesonide-Glycopyrrolate-Formoterol (Breztri  Aerosphere) 160-9-4.8 mcg/actuation inhaler. The patient is requesting a callback at [phone]

## 2024-02-11 NOTE — Telephone Encounter (Signed)
 Spoke with patient  Sent back [employer] application today in mail (for Jardiance ).   Aware that [employer] application has been submitted for re-enrollment (for Breztri ).

## 2024-02-17 ENCOUNTER — Ambulatory Visit (HOSPITAL_BASED_OUTPATIENT_CLINIC_OR_DEPARTMENT_OTHER)
Admission: RE | Admit: 2024-02-17 | Discharge: 2024-02-17 | Disposition: A | Discharge status: Home / Self Care | Source: Ambulatory Visit | Attending: Pulmonary Disease | Admitting: Pulmonary Disease

## 2024-02-17 DIAGNOSIS — J432 Centrilobular emphysema: Secondary | ICD-10-CM | POA: Diagnosis not present

## 2024-02-17 DIAGNOSIS — R918 Other nonspecific abnormal finding of lung field: Secondary | ICD-10-CM | POA: Diagnosis not present

## 2024-02-17 DIAGNOSIS — R911 Solitary pulmonary nodule: Secondary | ICD-10-CM | POA: Insufficient documentation

## 2024-02-18 NOTE — Telephone Encounter (Signed)
 Rec'd completed patient portion.   Emailed PCP page to Mliss SQUIBB for completion.

## 2024-02-22 ENCOUNTER — Other Ambulatory Visit (HOSPITAL_COMMUNITY): Payer: Self-pay

## 2024-02-22 NOTE — Telephone Encounter (Signed)
 PAP: re-enrollment application for Jardiance  has been submitted to [employer]), via fax.

## 2024-02-23 ENCOUNTER — Ambulatory Visit: Payer: Self-pay | Admitting: Pulmonary Disease

## 2024-03-06 NOTE — Telephone Encounter (Signed)
 Per [employer]: proof of income needed.  Contacted patient.   Patient says she will go by the bank to get a statement showing her SSI deposits.

## 2024-03-09 NOTE — Telephone Encounter (Signed)
 Faxed income (statement from bank provided by patient).

## 2024-03-20 ENCOUNTER — Telehealth: Payer: Self-pay | Admitting: Family Medicine

## 2024-03-20 NOTE — Telephone Encounter (Signed)
 Copied from CRM #[id number]. Topic: General - Other >> Mar 20, 2024  8:06 AM Carrielelia G wrote: Attn: Rosina Lavern SAILOR, CPhT  Please call regarding the JARDIANCE  application, patient stating she received a letter stating that she is missing information that was already given to the office to send.   Please advise

## 2024-03-28 NOTE — Telephone Encounter (Signed)
 Contacted [employer] to follow up on application status.   Per rep: SSI Statement is needed as proof of income instead of bank letter.  Contacted patient.  Says daughter in law faxed actual SSI statement 03/21/24 to fax number on letter from company. Will have her call me directly at [phone].

## 2024-03-31 NOTE — Telephone Encounter (Signed)
 Refaxed 2024 SSA form rec'd from daughter via fax, to [facility].  Scanned to patients media.

## 2024-04-06 NOTE — Telephone Encounter (Signed)
 PAP: Patient assistance re-enrollment application for Jardiance  has been approved by PAP Companies: [employer] from 03/31/24 to 03/29/25.   Medication should be delivered to: Home.   For further shipping updates, please contact [employer] ([employer]) at [phone].   Patient ID is: [id number]

## 2024-04-06 NOTE — Telephone Encounter (Signed)
 See 02/11/24 telephone note.

## 2024-04-06 NOTE — Telephone Encounter (Signed)
 PAP: Patient assistance re-enrollment application for Breztri  has been approved by PAP Companies: [employer] until 03/29/25.   Medication should be delivered to: Home.   For further shipping updates, please contact [employer] ([employer]) at [phone].   Patient ID is: [id number]  Please send refills to [facility] for patients re-enrollment. Thanks!

## 2024-04-12 ENCOUNTER — Other Ambulatory Visit: Payer: Self-pay | Admitting: Family Medicine

## 2024-04-12 DIAGNOSIS — Z1231 Encounter for screening mammogram for malignant neoplasm of breast: Secondary | ICD-10-CM

## 2024-05-04 ENCOUNTER — Ambulatory Visit: Payer: Self-pay

## 2024-05-04 NOTE — Telephone Encounter (Signed)
 FYI Only or Action Required?: FYI only for provider: appointment scheduled on 05/05/24.  Patient was last seen in primary care on 12/17/2023 by Jolinda Norene HERO, DO.  Called Nurse Triage reporting Arm Injury.  Symptoms began several weeks ago.  Interventions attempted: Nothing.  Symptoms are: stable.  Triage Disposition: See Physician Within 24 Hours  Patient/caregiver understands and will follow disposition?: Yes  Message from Sophia H sent at 05/04/2024 10:37 AM EST  Reason for Triage: Clemens on arm - severe pain. Wanting appt with pcp for xray orders   Reason for Disposition  [1] MODERATE pain (e.g., interferes with normal activities) AND [2] high-risk adult (e.g., age > 60 years, osteoporosis, chronic steroid use)  Answer Assessment - Initial Assessment Questions No appts today per pt request, advised she could go to [facility] in [place] and be seen today. Pt states she will just schedule for tomorrow and if something changes she will call back. Appt scheduled tomorrow 0850 Rosaline, NP  1. MECHANISM: How did the injury happen?     Had jelewery box on door fall on arm  2. ONSET: When did the injury happen? (e.g., minutes, hours ago)      2 weeks  3. LOCATION: Where is the injury located? Which arm?     R arm  5. SEVERITY: Can you use the arm normally?      Any movement causes 10/10 pain  6. SWELLING or BRUISING: is there any swelling or bruising? If Yes, ask: How large is it? (e.g., inches, centimeters)      no 7. PAIN: Is there pain? If Yes, ask: How bad is the pain? (Scale 0-10; or none, mild, moderate, severe)     Discomfort when not movement  Protocols used: Arm Injury-A-AH

## 2024-05-05 ENCOUNTER — Ambulatory Visit

## 2024-05-05 ENCOUNTER — Encounter: Payer: Self-pay | Admitting: Family Medicine

## 2024-05-05 ENCOUNTER — Telehealth: Payer: Self-pay | Admitting: Family Medicine

## 2024-05-05 ENCOUNTER — Ambulatory Visit: Admitting: Family Medicine

## 2024-05-05 VITALS — BP 125/75 | HR 111 | Temp 97.1°F | Ht 64.0 in | Wt 100.6 lb

## 2024-05-05 DIAGNOSIS — J441 Chronic obstructive pulmonary disease with (acute) exacerbation: Secondary | ICD-10-CM

## 2024-05-05 DIAGNOSIS — R051 Acute cough: Secondary | ICD-10-CM

## 2024-05-05 DIAGNOSIS — R062 Wheezing: Secondary | ICD-10-CM

## 2024-05-05 DIAGNOSIS — M25511 Pain in right shoulder: Secondary | ICD-10-CM

## 2024-05-05 MED ORDER — PREDNISONE 20 MG PO TABS: 40.0000 mg | ORAL_TABLET | Freq: Every day | ORAL | 0 refills | Status: AC

## 2024-05-05 MED ORDER — AMOXICILLIN-POT CLAVULANATE 400-57 MG/5ML PO SUSR: 400.0000 mg | Freq: Two times a day (BID) | ORAL | 0 refills | Status: AC

## 2024-05-05 MED ORDER — GUAIFENESIN ER 600 MG PO TB12: 600.0000 mg | ORAL_TABLET | Freq: Two times a day (BID) | ORAL | 0 refills | Status: AC

## 2024-05-05 MED ORDER — DOXYCYCLINE HYCLATE 100 MG PO TABS: 100.0000 mg | ORAL_TABLET | Freq: Two times a day (BID) | ORAL | 0 refills | Status: DC

## 2024-05-05 NOTE — Telephone Encounter (Signed)
 Patient aware and verbalized understanding.

## 2024-05-05 NOTE — Telephone Encounter (Signed)
 Copied from CRM [id number]. Topic: Clinical - Prescription Issue >> May 05, 2024 11:40 AM Diane Carlson wrote: Reason for CRM: Pt cannot swallow her pills, she needs a liquid alternative.   Doxycycline  Hyclate

## 2024-05-05 NOTE — Progress Notes (Signed)
 "    Subjective:  Patient ID: Diane Carlson, female    DOB: 1946-01-22, 79 y.o.   MRN: [id number]  Patient Care Team: Jolinda Norene HERO, DO as PCP - General (Family Medicine) Darron Deatrice LABOR, MD as PCP - [facility] (Cardiology) Marlee Bernardino NOVAK, MD as Attending Physician (Nephrology) Odean Potts, MD as Consulting Physician (Hematology and Oncology) Gail Favorite, MD as Consulting Physician (General Surgery) [facility] Billee Mliss BIRCH, RPH-CPP as Pharmacist (Family Medicine) Loni Soyla LABOR, MD as Consulting Physician (Cardiology)   Chief Complaint:  Shoulder Pain (Right shoulder pain after a jewelry box fell on shoulder x 2 weeks ago ) and Cough (X 1 week )   HPI: Diane Carlson is a 79 y.o. female presenting on 05/05/2024 for Shoulder Pain (Right shoulder pain after a jewelry box fell on shoulder x 2 weeks ago ) and Cough (X 1 week )    Diane Carlson is a 79 year old female with COPD who presents with increased cough and shoulder pain.  She has been experiencing right shoulder pain for the past three weeks after a box fell on her shoulder. The pain is described as stabbing and occurs with certain arm movements. The pain persists, but the cut from the incident is healing.  She reports a deep cough that has persisted for about a week. There is no increase in shortness of breath, but she is coughing up more sputum than usual. She feels more tired than usual but has not experienced any fevers or chills. She has not used her inhalers more frequently than normal and has not taken Mucinex . Her last COPD exacerbation requiring antibiotics was more than two years ago.  Her current medications include an albuterol  inhaler, which she has not been using more than usual. She has previously taken prednisone  and doxycycline . She is not a regular pill taker and prefers not to take large pills.          Relevant past medical, surgical, family, and social  history reviewed and updated as indicated.  Allergies and medications reviewed and updated. Data reviewed: Chart in Epic.   Past Medical History:  Diagnosis Date   Allergy    Anxiety    Atypical pneumonia 09/25/2017   Cancer (HCC)    breast cancer; dx in 04/2018.    Cholesteatoma    Complication of anesthesia    slow to wake up, couldn't catch my breath- has had anesthesia since this occurence and did fine.    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    Vitamin D  deficiency     Past Surgical History:  Procedure Laterality Date   ABDOMINAL HYSTERECTOMY     APPENDECTOMY     BREAST LUMPECTOMY Left 05/31/2018   BREAST LUMPECTOMY WITH RADIOACTIVE SEED LOCALIZATION Left 05/31/2018   Procedure: LEFT BREAST LUMPECTOMY WITH RADIOACTIVE SEED LOCALIZATION;  Surgeon: Gail Favorite, MD;  Location: [facility] OR;  Service: General;  Laterality: Left;   carotid endoectomy  Left    cataract Bilateral 02/27/14   ESOPHAGOSCOPY WITH DILITATION N/A 04/21/2023   Procedure: RIGID ESOPHAGOSCOPY WITH DILATION;  Surgeon: Carlie Clark, MD;  Location: [facility];  Service: ENT;  Laterality: N/A;   EYE SURGERY     catarat surgery bilaterally    Social History   Socioeconomic History   Marital status: Widowed    Spouse name: Not on file   Number of children: 2   Years of education: cosemtology certificate   Highest education level: Some college, no degree  Occupational History   Occupation: Retired    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[employer]  Tobacco Use   Smoking  status: Former    Current packs/day: 0.00    Average packs/day: 1 pack/day for 20.0 years (20.0 ttl pk-yrs)    Types: Cigarettes    Start date: 08/25/1989    Quit date: 08/25/2009    Years since quitting: 14.7   Smokeless tobacco: Never  Vaping Use   Vaping status: Never Used  Substance and Sexual Activity   Alcohol use: No   Drug use: No   Sexual activity: Not Currently    Birth control/protection: Surgical    Comment: Hyst  Other Topics Concern   Not on file  Social History Narrative   Lives alone   She exercises everyday and enjoys zumba   Social Drivers of Health   Tobacco Use: Medium Risk (05/05/2024)   Patient History    Smoking Tobacco Use: Former    Smokeless Tobacco Use: Never    Passive Exposure: Not on Actuary Strain: Low Risk (07/02/2023)   Overall Financial Resource Strain (CARDIA)    Difficulty of Paying Living Expenses: Not hard at all  Food Insecurity: No Food Insecurity (07/02/2023)   Hunger Vital Sign    Worried About Running Out of Food in the Last Year: Never true    Ran Out of Food in the Last Year: Never true  Transportation Needs: No Transportation Needs (07/02/2023)   PRAPARE - Administrator, Civil Service (Medical): No    Lack of Transportation (Non-Medical): No  Physical Activity: Sufficiently Active (07/02/2023)   Exercise Vital Sign    Days of Exercise per Week: 5 days    Minutes of Exercise per Session: 60 min  Stress: No Stress Concern Present (07/02/2023)   [employer] of Occupational Health - Occupational Stress Questionnaire    Feeling of Stress : Not at all  Social Connections: Moderately Integrated (07/02/2023)   Social Connection and Isolation Panel    Frequency of Communication with Friends and Family: More than three times a week    Frequency of Social Gatherings with Friends and Family: Three times a week    Attends Religious Services: More than 4 times per year    Active Member of Clubs or Organizations:  Yes    Attends Banker Meetings: More than 4 times per year    Marital Status: Widowed  Intimate Partner Violence: Not At Risk (07/02/2023)   Humiliation, Afraid, Rape, and Kick questionnaire    Fear of Current or Ex-Partner: No    Emotionally Abused: No    Physically Abused: No    Sexually Abused: No  Depression (PHQ2-9): Low Risk (05/05/2024)   Depression (PHQ2-9)    PHQ-2 Score: 0  Alcohol Screen: Low Risk (07/02/2023)   Alcohol Screen    Last Alcohol Screening Score (AUDIT): 0  Housing: Unknown (07/02/2023)   Housing Stability Vital Sign  Unable to Pay for Housing in the Last Year: No    Number of Times Moved in the Last Year: Not on file    Homeless in the Last Year: No  Utilities: Not At Risk (07/02/2023)   AHC Utilities    Threatened with loss of utilities: No  Health Literacy: Adequate Health Literacy (07/02/2023)   B1300 Health Literacy    Frequency of need for help with medical instructions: Never    Outpatient Encounter Medications as of 05/05/2024  Medication Sig   albuterol  (VENTOLIN  HFA) 108 (90 Base) MCG/ACT inhaler Inhale 2 puffs into the lungs every 6 (six) hours as needed for wheezing or shortness of breath.   amLODipine  (NORVASC ) 10 MG tablet Take 1 tablet (10 mg total) by mouth daily.   Ascorbic Acid (VITAMIN C) 1000 MG tablet Take 1,000 mg by mouth daily.   aspirin  81 MG tablet Take 81 mg by mouth daily.   Budeson-Glycopyrrol-Formoterol (BREZTRI  AEROSPHERE) 160-9-4.8 MCG/ACT AERO Inhale 2 puffs into the lungs in the morning and at bedtime.   busPIRone  (BUSPAR ) 5 MG tablet Take 1 tablet (5 mg total) by mouth 3 (three) times daily as needed (anxiety).   cholecalciferol (VITAMIN D ) 1000 UNITS tablet Take 1,000 Units by mouth daily.   cyanocobalamin (VITAMIN B12) 500 MCG tablet Take 500 mcg by mouth daily.   doxycycline  (VIBRA -TABS) 100 MG tablet Take 1 tablet (100 mg total) by mouth 2 (two) times daily for 10 days.   ELDERBERRY PO Take by mouth.    empagliflozin  (JARDIANCE ) 10 MG TABS tablet Take 1 tablet (10 mg total) by mouth daily. Cancel jardiance  25mg  daily prescription   fluticasone  (FLONASE ) 50 MCG/ACT nasal spray Place 2 sprays into both nostrils daily.   Garlic 1000 MG CAPS Take 1,000 mg by mouth 2 (two) times daily.    guaiFENesin  (MUCINEX ) 600 MG 12 hr tablet Take 1 tablet (600 mg total) by mouth 2 (two) times daily for 10 days.   predniSONE  (DELTASONE ) 20 MG tablet Take 2 tablets (40 mg total) by mouth daily with breakfast for 5 days.   rosuvastatin  (CRESTOR ) 10 MG tablet Take 1 tablet (10 mg total) by mouth daily.   Tiotropium Bromide-Olodaterol (STIOLTO RESPIMAT ) 2.5-2.5 MCG/ACT AERS Inhale 2 puffs into the lungs daily.   Zinc Sulfate (ZINC 15 PO) Take by mouth.   TURMERIC PO Take by mouth. (Patient not taking: Reported on 05/05/2024)   No facility-administered encounter medications on file as of 05/05/2024.    Allergies[1]  Pertinent ROS per HPI, otherwise unremarkable      Objective:  BP 125/75   Pulse (!) 111   Temp (!) 97.1 F (36.2 C)   Ht 5' 4 (1.626 m)   Wt 100 lb 9.6 oz (45.6 kg)   SpO2 90%   BMI 17.27 kg/m    Wt Readings from Last 3 Encounters:  05/05/24 100 lb 9.6 oz (45.6 kg)  12/28/23 101 lb (45.8 kg)  12/17/23 100 lb 2 oz (45.4 kg)    Physical Exam Vitals and nursing note reviewed.  Constitutional:      General: She is not in acute distress.    Appearance: Normal appearance. She is normal weight. She is ill-appearing (chronically ill). She is not toxic-appearing or diaphoretic.  HENT:     Head: Normocephalic and atraumatic.     Right Ear: Tympanic membrane, ear canal and external ear normal.     Left Ear: Tympanic membrane, ear canal and external ear normal.     Nose:  Nose normal.     Mouth/Throat:     Mouth: Mucous membranes are moist.  Eyes:     Pupils: Pupils are equal, round, and reactive to light.  Cardiovascular:     Rate and Rhythm: Regular rhythm. Tachycardia present.     Heart  sounds: Normal heart sounds.  Pulmonary:     Effort: Prolonged expiration present.     Breath sounds: Wheezing present. No rhonchi.  Musculoskeletal:     Right shoulder: Tenderness present. No swelling, deformity, effusion, laceration, bony tenderness or crepitus. Normal range of motion. Normal strength. Normal pulse.     Right upper arm: Normal.     Cervical back: Normal and neck supple.     Right lower leg: No edema.     Left lower leg: No edema.  Skin:    General: Skin is warm and dry.     Capillary Refill: Capillary refill takes less than 2 seconds.     Findings: Abrasion present.      Neurological:     General: No focal deficit present.     Mental Status: She is alert and oriented to person, place, and time.  Psychiatric:        Mood and Affect: Mood normal.        Behavior: Behavior normal.        Thought Content: Thought content normal.        Judgment: Judgment normal.    X-Ray: right shoulder: No acute findings. Preliminary x-ray reading by Rosaline Bruns, FNP-C, [facility].  X-Ray: CXR: COPD changes, No acute findings. Preliminary x-ray reading by Rosaline Bruns, FNP-C, [facility].   Results for orders placed or performed in visit on 12/14/23  Lipid panel   Collection Time: 12/14/23  8:34 AM  Result Value Ref Range   Cholesterol, Total 139 100 - 199 mg/dL   Triglycerides 76 0 - 149 mg/dL   HDL 74 >60 mg/dL   VLDL Cholesterol Cal 15 5 - 40 mg/dL   LDL Chol Calc (NIH) 50 0 - 99 mg/dL   Chol/HDL Ratio 1.9 0.0 - 4.4 ratio  CMP14+EGFR   Collection Time: 12/14/23  8:34 AM  Result Value Ref Range   Glucose 90 70 - 99 mg/dL   BUN 34 (H) 8 - 27 mg/dL   Creatinine, Ser 7.18 (H) 0.57 - 1.00 mg/dL   eGFR 17 (L) >40 fO/fpw/8.26   BUN/Creatinine Ratio 12 12 - 28   Sodium 142 134 - 144 mmol/L   Potassium 4.7 3.5 - 5.2 mmol/L   Chloride 106 96 - 106 mmol/L   CO2 19 (L) 20 - 29 mmol/L   Calcium  10.0 8.7 - 10.3 mg/dL   Total Protein 7.3 6.0 - 8.5 g/dL   Albumin 4.6 3.8 - 4.8 g/dL    Globulin, Total 2.7 1.5 - 4.5 g/dL   Bilirubin Total 0.2 0.0 - 1.2 mg/dL   Alkaline Phosphatase 176 (H) 49 - 135 IU/L   AST 20 0 - 40 IU/L   ALT 11 0 - 32 IU/L  CBC with Differential/Platelet   Collection Time: 12/14/23  8:34 AM  Result Value Ref Range   WBC 9.3 3.4 - 10.8 x10E3/uL   RBC 3.89 3.77 - 5.28 x10E6/uL   Hemoglobin 11.7 11.1 - 15.9 g/dL   Hematocrit 63.6 65.9 - 46.6 %   MCV 93 79 - 97 fL   MCH 30.1 26.6 - 33.0 pg   MCHC 32.2 31.5 - 35.7 g/dL   RDW 86.7 88.2 - 84.5 %  Platelets 255 150 - 450 x10E3/uL   Neutrophils 72 Not Estab. %   Lymphs 18 Not Estab. %   Monocytes 7 Not Estab. %   Eos 2 Not Estab. %   Basos 0 Not Estab. %   Neutrophils Absolute 6.7 1.4 - 7.0 x10E3/uL   Lymphocytes Absolute 1.7 0.7 - 3.1 x10E3/uL   Monocytes Absolute 0.7 0.1 - 0.9 x10E3/uL   EOS (ABSOLUTE) 0.2 0.0 - 0.4 x10E3/uL   Basophils Absolute 0.0 0.0 - 0.2 x10E3/uL   Immature Granulocytes 1 Not Estab. %   Immature Grans (Abs) 0.1 0.0 - 0.1 x10E3/uL       Pertinent labs & imaging results that were available during my care of the patient were reviewed by me and considered in my medical decision making.  Assessment & Plan:  Khamille was seen today for shoulder pain and cough.  Diagnoses and all orders for this visit:  Acute cough -     DG Chest 2 View; Future  Acute pain of right shoulder -     DG Shoulder Right; Future -     predniSONE  (DELTASONE ) 20 MG tablet; Take 2 tablets (40 mg total) by mouth daily with breakfast for 5 days.  COPD exacerbation (HCC) -     guaiFENesin  (MUCINEX ) 600 MG 12 hr tablet; Take 1 tablet (600 mg total) by mouth 2 (two) times daily for 10 days. -     doxycycline  (VIBRA -TABS) 100 MG tablet; Take 1 tablet (100 mg total) by mouth 2 (two) times daily for 10 days. -     predniSONE  (DELTASONE ) 20 MG tablet; Take 2 tablets (40 mg total) by mouth daily with breakfast for 5 days.  Wheezing -     guaiFENesin  (MUCINEX ) 600 MG 12 hr tablet; Take 1 tablet (600 mg total)  by mouth 2 (two) times daily for 10 days. -     doxycycline  (VIBRA -TABS) 100 MG tablet; Take 1 tablet (100 mg total) by mouth 2 (two) times daily for 10 days. -     predniSONE  (DELTASONE ) 20 MG tablet; Take 2 tablets (40 mg total) by mouth daily with breakfast for 5 days.        COPD exacerbation Increased cough and sputum production for one week, with no recent antibiotic use. No fever or chills, but increased fatigue. Wheezing present. Risk of progression to pneumonia discussed. - Prescribed doxycycline  to prevent progression to pneumonia. - Prescribed prednisone  to reduce inflammation and aid in shoulder pain relief. - Advised use of albuterol  inhaler every six hours to open airways and facilitate mucus clearance. - Recommended Mucinex  (guaifenesin ) twice daily with water to thin secretions. - Instructed to report if no improvement within a few days to avoid hospitalization.  Acute pain of right shoulder Right shoulder pain following a fall with a heavy box, persisting for three weeks. No bony injury on x-ray. Pain likely due to soft tissue injury. - Prescribed prednisone  to aid in shoulder pain relief.          Continue all other maintenance medications.  Follow up plan: Return if symptoms worsen or fail to improve.   Continue healthy lifestyle choices, including diet (rich in fruits, vegetables, and lean proteins, and low in salt and simple carbohydrates) and exercise (at least 30 minutes of moderate physical activity daily).  Educational handout given for COPD exacerbation   The above assessment and management plan was discussed with the patient. The patient verbalized understanding of and has agreed to the management plan. Patient is  aware to call the clinic if they develop any new symptoms or if symptoms persist or worsen. Patient is aware when to return to the clinic for a follow-up visit. Patient educated on when it is appropriate to go to the emergency department.    Rosaline Bruns, FNP-C [facility] [phone]     [1]  Allergies Allergen Reactions   Pravastatin     Muscle aches   Zetia [Ezetimibe]     mylagias   Cephalexin Rash    Inside mouth   Sulfa Antibiotics Rash    Including mouth   "

## 2024-05-05 NOTE — Addendum Note (Signed)
 Addended by: SEVERA ROSALINE HERO on: 05/05/2024 01:18 PM   Modules accepted: Orders

## 2024-05-25 ENCOUNTER — Ambulatory Visit

## 2024-06-12 ENCOUNTER — Other Ambulatory Visit: Payer: Self-pay

## 2024-06-16 ENCOUNTER — Encounter: Payer: Self-pay | Admitting: Family Medicine

## 2024-07-03 ENCOUNTER — Ambulatory Visit: Payer: Self-pay
